# Patient Record
Sex: Male | Born: 1940
Health system: Southern US, Community
[De-identification: ages and names within clinical notes are randomized; demographics above are authoritative.]

## PROBLEM LIST (undated history)

## (undated) DIAGNOSIS — C61 Malignant neoplasm of prostate: Secondary | ICD-10-CM

## (undated) DIAGNOSIS — E785 Hyperlipidemia, unspecified: Secondary | ICD-10-CM

## (undated) DIAGNOSIS — I10 Essential (primary) hypertension: Secondary | ICD-10-CM

## (undated) DIAGNOSIS — N529 Male erectile dysfunction, unspecified: Secondary | ICD-10-CM

## (undated) DIAGNOSIS — I724 Aneurysm of artery of lower extremity: Secondary | ICD-10-CM

## (undated) DIAGNOSIS — K219 Gastro-esophageal reflux disease without esophagitis: Secondary | ICD-10-CM

## (undated) DIAGNOSIS — M1990 Unspecified osteoarthritis, unspecified site: Secondary | ICD-10-CM

## (undated) DIAGNOSIS — I739 Peripheral vascular disease, unspecified: Secondary | ICD-10-CM

## (undated) HISTORY — DX: Morbid (severe) obesity due to excess calories: E66.01

## (undated) HISTORY — PX: COLONOSCOPY: SHX174

---
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
## 2011-09-05 ENCOUNTER — Ambulatory Visit (INDEPENDENT_AMBULATORY_CARE_PROVIDER_SITE_OTHER): Payer: Medicare Other | Admitting: Internal Medicine

## 2011-09-05 ENCOUNTER — Telehealth: Payer: Self-pay | Admitting: Internal Medicine

## 2011-09-05 ENCOUNTER — Encounter: Payer: Self-pay | Admitting: Internal Medicine

## 2011-09-05 DIAGNOSIS — R972 Elevated prostate specific antigen [PSA]: Secondary | ICD-10-CM

## 2011-09-05 DIAGNOSIS — C61 Malignant neoplasm of prostate: Secondary | ICD-10-CM | POA: Insufficient documentation

## 2011-09-05 DIAGNOSIS — E785 Hyperlipidemia, unspecified: Secondary | ICD-10-CM

## 2011-09-05 DIAGNOSIS — Z79899 Other long term (current) drug therapy: Secondary | ICD-10-CM

## 2011-09-05 DIAGNOSIS — I10 Essential (primary) hypertension: Secondary | ICD-10-CM

## 2011-09-05 MED ORDER — SIMVASTATIN 40 MG PO TABS: 40.0000 mg | ORAL_TABLET | Freq: Every day | ORAL | Status: DC

## 2011-09-05 MED ORDER — LOSARTAN POTASSIUM-HCTZ 100-25 MG PO TABS: 1.0000 | ORAL_TABLET | Freq: Every day | ORAL | Status: DC

## 2011-09-05 NOTE — Progress Notes (Signed)
  Subjective:    Patient ID: Bryan Rivers, male    DOB: September 25, 1941, 70 y.o.   MRN: [id number]  HPI Pt presents to clinic to establish care and for follow up of hyperlipidemia and hypertension. Previous PMD recently retired. BP has been under good control and he exercises daily with cardio. Tolerates medication without adverse effect. Hopes with exercise and wt loss may ultimately lower amount of medication taken. Tolerates statin tx and reports previous good control of cholesterol with medication. Recalls possible past elevated psa though number may have actually been in the 3 range. Denies bph sx's. Feels well without complaint. Declines influenza, pneumovax or zostavax vaccines. Recalls colonoscopy last performed 2009 and nl without family h/o colon cancer.   Reviewed pmh, psh, medications, allergies, soc hx and fam hx.    Review of Systems  Respiratory: Negative for shortness of breath.   Cardiovascular: Negative for chest pain.  Gastrointestinal: Negative for abdominal pain and blood in stool.  Genitourinary: Negative for difficulty urinating.  All other systems reviewed and are negative.       Objective:   Physical Exam  Nursing note and vitals reviewed. Constitutional: He appears well-developed and well-nourished. No distress.  HENT:  Head: Normocephalic and atraumatic.  Right Ear: Tympanic membrane, external ear and ear canal normal.  Left Ear: Tympanic membrane, external ear and ear canal normal.  Nose: Nose normal.  Mouth/Throat: Oropharynx is clear and moist. No oropharyngeal exudate.  Eyes: Conjunctivae are normal. No scleral icterus.  Neck: Neck supple. Carotid bruit is not present.  Cardiovascular: Normal rate, regular rhythm and normal heart sounds.  Exam reveals no gallop and no friction rub.   No murmur heard. Pulmonary/Chest: Effort normal and breath sounds normal. No respiratory distress. He has no wheezes. He has no rales.  Neurological: He is alert.  Skin: Skin  is warm and dry. He is not diaphoretic.  Psychiatric: He has a normal mood and affect.          Assessment & Plan:

## 2011-09-05 NOTE — Assessment & Plan Note (Signed)
Records unavailable. Obtain psa

## 2011-09-05 NOTE — Assessment & Plan Note (Signed)
RF statin. Obtain lipid/lft.

## 2011-09-05 NOTE — Patient Instructions (Signed)
Please schedule cbc (401.9), chem7 (v58.69), lipid/lft (272.4) and psa (elevated psa) for Thursday morning 09/07/11. Also please schedule chem7 (v58.69) and lipid/lft (272.4) prior to next visit

## 2011-09-05 NOTE — Telephone Encounter (Signed)
Please order labs for Thursday 09/07/11. Patient will be going downstairs.   Also, please order labs for one week prior to 03/05/12 visit.

## 2011-09-07 ENCOUNTER — Telehealth: Payer: Self-pay | Admitting: *Deleted

## 2011-09-07 LAB — CBC
HCT: 46.1 % (ref 39.0–52.0)
Hemoglobin: 16 g/dL (ref 13.0–17.0)
RBC: 5.33 MIL/uL (ref 4.22–5.81)
RDW: 13.3 % (ref 11.5–15.5)
WBC: 5.6 10*3/uL (ref 4.0–10.5)

## 2011-09-07 LAB — BASIC METABOLIC PANEL
BUN: 25 mg/dL — ABNORMAL HIGH (ref 6–23)
Chloride: 101 mEq/L (ref 96–112)
Glucose, Bld: 103 mg/dL — ABNORMAL HIGH (ref 70–99)
Potassium: 4.8 mEq/L (ref 3.5–5.3)

## 2011-09-07 LAB — LIPID PANEL
LDL Cholesterol: 118 mg/dL — ABNORMAL HIGH (ref 0–99)
Triglycerides: 83 mg/dL (ref <150)
VLDL: 17 mg/dL (ref 0–40)

## 2011-09-07 LAB — HEPATIC FUNCTION PANEL
ALT: 42 U/L (ref 0–53)
Bilirubin, Direct: 0.2 mg/dL (ref 0.0–0.3)

## 2011-09-07 NOTE — Telephone Encounter (Signed)
Call was returned to patient at [phone], no answer. A voice message was left informing patient call was returned.

## 2011-09-07 NOTE — Telephone Encounter (Signed)
Patients wife called and left voice message stating patient picked up Rx from pharmacy and they needed clarification on the medication.

## 2011-09-08 NOTE — Telephone Encounter (Signed)
Call placed to patient at [phone], patient wife explained that patient was not familiar with the generic of the Mobic, and thought that he was given something else. She stated they contacted the pharmacy and clarification was provided to patient. They have no concerns at this time. Medication concerns was addressed by the pharmacist.

## 2011-09-13 ENCOUNTER — Other Ambulatory Visit: Payer: Self-pay | Admitting: Internal Medicine

## 2011-09-13 DIAGNOSIS — R972 Elevated prostate specific antigen [PSA]: Secondary | ICD-10-CM

## 2011-09-25 ENCOUNTER — Telehealth: Payer: Self-pay | Admitting: Internal Medicine

## 2011-09-25 NOTE — Telephone Encounter (Signed)
She is upset with the wrong people. We did not charge her. It apparently is the policy of [facility] or their record company. Any complaints should be directed towards [facility].

## 2011-09-25 NOTE — Telephone Encounter (Signed)
Call placed to patient at [phone], he stated his wife placed the call, and she stated that she was not requested a return phone call. Nothing further needed at this time.

## 2011-09-25 NOTE — Telephone Encounter (Signed)
Patient's wife called upset.  We requested records for Bryan Rivers from [employer].  [facility] uses [employer] and they sent her a bill for $28 to send the records to [place].  She contends we should have told her [facility] was going to charge for the records.  I advised her to call [facility] and maybe they will waive the charge

## 2011-12-21 ENCOUNTER — Other Ambulatory Visit: Payer: Self-pay | Admitting: *Deleted

## 2011-12-21 DIAGNOSIS — R972 Elevated prostate specific antigen [PSA]: Secondary | ICD-10-CM

## 2011-12-22 LAB — PSA: PSA: 6.74 ng/mL — ABNORMAL HIGH (ref <=4.00)

## 2012-02-27 ENCOUNTER — Other Ambulatory Visit: Payer: Self-pay | Admitting: Internal Medicine

## 2012-02-27 LAB — LIPID PANEL
Cholesterol: 175 mg/dL (ref 0–200)
LDL Cholesterol: 107 mg/dL — ABNORMAL HIGH (ref 0–99)
VLDL: 24 mg/dL (ref 0–40)

## 2012-02-27 NOTE — Telephone Encounter (Signed)
Pt presented to the lab, future orders released. 

## 2012-02-27 NOTE — Telephone Encounter (Signed)
Addended by: Mervin Kung A on: 02/27/2012 09:00 AM   Modules accepted: Orders

## 2012-02-28 LAB — HEPATIC FUNCTION PANEL
ALT: 36 U/L (ref 0–53)
Bilirubin, Direct: 0.1 mg/dL (ref 0.0–0.3)
Indirect Bilirubin: 0.6 mg/dL (ref 0.0–0.9)

## 2012-02-28 LAB — BASIC METABOLIC PANEL
CO2: 27 mEq/L (ref 19–32)
Calcium: 9.9 mg/dL (ref 8.4–10.5)
Sodium: 140 mEq/L (ref 135–145)

## 2012-03-05 ENCOUNTER — Encounter: Payer: Self-pay | Admitting: Internal Medicine

## 2012-03-05 ENCOUNTER — Ambulatory Visit (INDEPENDENT_AMBULATORY_CARE_PROVIDER_SITE_OTHER): Payer: Medicare Other | Admitting: Internal Medicine

## 2012-03-05 VITALS — BP 118/80 | HR 64 | Temp 98.4°F | Resp 18 | Ht 69.0 in | Wt 223.0 lb

## 2012-03-05 DIAGNOSIS — E785 Hyperlipidemia, unspecified: Secondary | ICD-10-CM

## 2012-03-05 DIAGNOSIS — I10 Essential (primary) hypertension: Secondary | ICD-10-CM

## 2012-03-05 DIAGNOSIS — R972 Elevated prostate specific antigen [PSA]: Secondary | ICD-10-CM

## 2012-03-05 NOTE — Assessment & Plan Note (Signed)
Reviewed mildly elevated psa that is slowly rising from baseline. Asx without evidence of urinary obstruction. Discussion held regarding PSA test including chance of representing prostate cancer versus false positive. Pt not interested currently in urology evaluation or biopsy. Agrees to follow up PSA's to determine kinetics. Understands this could potentially represent malignancy.

## 2012-03-05 NOTE — Assessment & Plan Note (Signed)
Normotensive and stable. Continue current regimen. Monitor bp as outpt and followup in clinic as scheduled.  

## 2012-03-05 NOTE — Patient Instructions (Signed)
Please schedule fasting labs prior to next visit Lipid- 272.4 and PSA-elevated psa

## 2012-03-05 NOTE — Progress Notes (Signed)
  Subjective:    Patient ID: Bryan Rivers, male    DOB: 1941-06-07, 71 y.o.   MRN: [id number]  HPI Pt presents to clinic for followup of multiple medical problems. H/o mildly elevated psa-reviewed values from 2010 and 2011 in the 5 range. More recent PSA's reviewed 5-6 range representing mild elevation. Denies any urinary complaint. Tolerating statin tx without adverse effect. LDL improved. BP reviewed normotensive.   No past medical history on file. Past Surgical History  Procedure Date  . Appendectomy 1950    age 9    reports that he has never smoked. He has never used smokeless tobacco. He reports that he does not drink alcohol or use illicit drugs. family history includes Diabetes in his mother and Heart disease in his mother. Not on File    Review of Systems see hpi     Objective:   Physical Exam  Physical Exam  Nursing note and vitals reviewed. Constitutional: Appears well-developed and well-nourished. No distress.  HENT:  Head: Normocephalic and atraumatic.  Right Ear: External ear normal.  Left Ear: External ear normal.  Eyes: Conjunctivae are normal. No scleral icterus.  Neck: Neck supple. Carotid bruit is not present.  Cardiovascular: Normal rate, regular rhythm and normal heart sounds.  Exam reveals no gallop and no friction rub.   No murmur heard. Pulmonary/Chest: Effort normal and breath sounds normal. No respiratory distress. He has no wheezes. no rales.  Lymphadenopathy:    He has no cervical adenopathy.  Neurological:Alert.  Skin: Skin is warm and dry. Not diaphoretic.  Psychiatric: Has a normal mood and affect.        Assessment & Plan:

## 2012-03-05 NOTE — Assessment & Plan Note (Signed)
Improved control. Pt wishes trial of lower dose statin. Decrease zocor 20mg  qd. Recheck lipid prior to next visit

## 2012-03-28 ENCOUNTER — Telehealth: Payer: Self-pay | Admitting: *Deleted

## 2012-03-28 MED ORDER — ZOSTER VACCINE LIVE 19400 UNT/0.65ML ~~LOC~~ SOLR: 0.6500 mL | Freq: Once | SUBCUTANEOUS | Status: AC

## 2012-03-28 NOTE — Telephone Encounter (Signed)
Call returned to patient at [phone], he was informed of Rx to pharmacy.

## 2012-03-28 NOTE — Telephone Encounter (Signed)
Patients wife called and left voice message stating her husband insurance will cover the Zostavax if the vaccine is given at the pharmacy. She is requesting a Rx to [facility] on [address] if approved.

## 2012-03-28 NOTE — Telephone Encounter (Signed)
Ok for zostavax and pharmacy to [facility]

## 2012-07-02 ENCOUNTER — Telehealth: Payer: Self-pay | Admitting: *Deleted

## 2012-07-02 ENCOUNTER — Ambulatory Visit: Payer: Medicare Other | Admitting: Internal Medicine

## 2012-07-02 DIAGNOSIS — R972 Elevated prostate specific antigen [PSA]: Secondary | ICD-10-CM

## 2012-07-02 DIAGNOSIS — E785 Hyperlipidemia, unspecified: Secondary | ICD-10-CM

## 2012-07-02 LAB — LIPID PANEL
Cholesterol: 232 mg/dL — ABNORMAL HIGH (ref 0–200)
Total CHOL/HDL Ratio: 4.4 Ratio
Triglycerides: 120 mg/dL (ref <150)
VLDL: 24 mg/dL (ref 0–40)

## 2012-07-02 NOTE — Telephone Encounter (Signed)
Pt presented to the lab, order entered per 03/05/12 office note:  Please schedule fasting labs prior to next visit  Lipid- 272.4 and PSA-elevated psa

## 2012-07-09 ENCOUNTER — Encounter: Payer: Self-pay | Admitting: Internal Medicine

## 2012-07-09 ENCOUNTER — Ambulatory Visit (INDEPENDENT_AMBULATORY_CARE_PROVIDER_SITE_OTHER): Payer: Medicare Other | Admitting: Internal Medicine

## 2012-07-09 ENCOUNTER — Telehealth: Payer: Self-pay | Admitting: Internal Medicine

## 2012-07-09 VITALS — BP 122/68 | HR 81 | Temp 98.4°F | Resp 16 | Wt 230.0 lb

## 2012-07-09 DIAGNOSIS — Z79899 Other long term (current) drug therapy: Secondary | ICD-10-CM

## 2012-07-09 DIAGNOSIS — R972 Elevated prostate specific antigen [PSA]: Secondary | ICD-10-CM

## 2012-07-09 DIAGNOSIS — I10 Essential (primary) hypertension: Secondary | ICD-10-CM

## 2012-07-09 DIAGNOSIS — E785 Hyperlipidemia, unspecified: Secondary | ICD-10-CM

## 2012-07-09 NOTE — Patient Instructions (Signed)
Please schedule fasting labs prior to next visit Cbc-401.9, chem7-v58.69, lipid/lft-272.4 

## 2012-07-09 NOTE — Assessment & Plan Note (Signed)
Stable and asx. Continue to monitor.

## 2012-07-09 NOTE — Assessment & Plan Note (Signed)
suboptimal control. Resume zocor 40mg  po qd. Obtain lipid/lft prior to next visit

## 2012-07-09 NOTE — Assessment & Plan Note (Signed)
Normotensive and stable. Continue current regimen. Monitor bp as outpt and followup in clinic as scheduled. Obtain cbc and chem7 prior to next visit 

## 2012-07-09 NOTE — Progress Notes (Signed)
  Subjective:    Patient ID: Bryan Rivers, male    DOB: [birth year], 71 y.o.   MRN: [id number]  HPI Pt presents to clinic for followup of multiple medical problems. H/o mildly elevated psa with typical range 5-6's. Not interested in urology evaluation. Reviewed psa stable. Denies urinary sx's such as hesitency, nocturia or dribbling. Reviewed lipid profile now suboptimal after dose reduction of statin. Feels well overall without complaint. Exercises regularly.   No past medical history on file. Past Surgical History  Procedure Date  . Appendectomy 1950    age 71    reports that he has never smoked. He has never used smokeless tobacco. He reports that he does not drink alcohol or use illicit drugs. family history includes Diabetes in his mother and Heart disease in his mother. No Known Allergies    Review of Systems see hpi     Objective:   Physical Exam  Physical Exam  Nursing note and vitals reviewed. Constitutional: Appears well-developed and well-nourished. No distress.  HENT:  Head: Normocephalic and atraumatic.  Right Ear: External ear normal.  Left Ear: External ear normal.  Eyes: Conjunctivae are normal. No scleral icterus.  Neck: Neck supple. Carotid bruit is not present.  Cardiovascular: Normal rate, regular rhythm and normal heart sounds.  Exam reveals no gallop and no friction rub.   No murmur heard. Pulmonary/Chest: Effort normal and breath sounds normal. No respiratory distress. He has no wheezes. no rales.  Lymphadenopathy:    He has no cervical adenopathy.  Neurological:Alert.  Skin: Skin is warm and dry. Not diaphoretic.  Psychiatric: Has a normal mood and affect.        Assessment & Plan:

## 2012-08-01 NOTE — Telephone Encounter (Signed)
Future orders entered and copy given to the lab. 

## 2012-09-02 ENCOUNTER — Telehealth: Payer: Self-pay | Admitting: Internal Medicine

## 2012-09-02 DIAGNOSIS — I10 Essential (primary) hypertension: Secondary | ICD-10-CM

## 2012-09-02 DIAGNOSIS — E785 Hyperlipidemia, unspecified: Secondary | ICD-10-CM

## 2012-09-02 DIAGNOSIS — Z79899 Other long term (current) drug therapy: Secondary | ICD-10-CM

## 2012-09-02 MED ORDER — SIMVASTATIN 40 MG PO TABS: 40.0000 mg | ORAL_TABLET | Freq: Every day | ORAL | Status: DC

## 2012-09-02 NOTE — Telephone Encounter (Signed)
Refill- simvastatin 40mg  tablets. Take one tablet by mouth every night at bedtime. Qty 30 last fill 7.8.13

## 2012-09-16 ENCOUNTER — Telehealth: Payer: Self-pay | Admitting: Internal Medicine

## 2012-09-16 MED ORDER — LOSARTAN POTASSIUM-HCTZ 100-25 MG PO TABS: 1.0000 | ORAL_TABLET | Freq: Every day | ORAL | Status: DC

## 2012-09-16 NOTE — Telephone Encounter (Signed)
Rx to pharmacy/SLS 

## 2012-09-16 NOTE — Telephone Encounter (Signed)
Refill-losartan/hctz 100/25mg  tablets. Take one tablet by mouth daily. Qty 30 last fill 7.21.13

## 2012-10-31 LAB — HEPATIC FUNCTION PANEL
AST: 17 U/L (ref 0–37)
Albumin: 4.5 g/dL (ref 3.5–5.2)
Alkaline Phosphatase: 56 U/L (ref 39–117)
Indirect Bilirubin: 0.7 mg/dL (ref 0.0–0.9)
Total Bilirubin: 0.8 mg/dL (ref 0.3–1.2)
Total Protein: 6.7 g/dL (ref 6.0–8.3)

## 2012-10-31 LAB — CBC WITH DIFFERENTIAL/PLATELET
Eosinophils Absolute: 0.1 10*3/uL (ref 0.0–0.7)
Eosinophils Relative: 2 % (ref 0–5)
HCT: 45.6 % (ref 39.0–52.0)
Hemoglobin: 15.9 g/dL (ref 13.0–17.0)
Lymphocytes Relative: 36 % (ref 12–46)
Lymphs Abs: 1.8 10*3/uL (ref 0.7–4.0)
MCH: 29.3 pg (ref 26.0–34.0)
MCV: 84 fL (ref 78.0–100.0)
Monocytes Relative: 11 % (ref 3–12)
RBC: 5.43 MIL/uL (ref 4.22–5.81)

## 2012-10-31 LAB — BASIC METABOLIC PANEL
Chloride: 103 mEq/L (ref 96–112)
Potassium: 4.7 mEq/L (ref 3.5–5.3)
Sodium: 140 mEq/L (ref 135–145)

## 2012-10-31 LAB — LIPID PANEL
LDL Cholesterol: 110 mg/dL — ABNORMAL HIGH (ref 0–99)
Total CHOL/HDL Ratio: 3.6 Ratio

## 2012-10-31 NOTE — Addendum Note (Signed)
Addended by: Regis Bill on: 10/31/2012 09:06 AM   Modules accepted: Orders

## 2012-11-04 ENCOUNTER — Ambulatory Visit (INDEPENDENT_AMBULATORY_CARE_PROVIDER_SITE_OTHER): Payer: Medicare Other | Admitting: Internal Medicine

## 2012-11-04 ENCOUNTER — Telehealth: Payer: Self-pay | Admitting: Internal Medicine

## 2012-11-04 ENCOUNTER — Encounter: Payer: Self-pay | Admitting: Internal Medicine

## 2012-11-04 VITALS — BP 148/78 | HR 64 | Temp 98.2°F | Resp 12 | Wt 227.1 lb

## 2012-11-04 DIAGNOSIS — I10 Essential (primary) hypertension: Secondary | ICD-10-CM

## 2012-11-04 DIAGNOSIS — E785 Hyperlipidemia, unspecified: Secondary | ICD-10-CM

## 2012-11-04 DIAGNOSIS — R7309 Other abnormal glucose: Secondary | ICD-10-CM

## 2012-11-04 DIAGNOSIS — R739 Hyperglycemia, unspecified: Secondary | ICD-10-CM

## 2012-11-04 NOTE — Assessment & Plan Note (Signed)
Good control. Continue statin tx 

## 2012-11-04 NOTE — Patient Instructions (Addendum)
Please schedule fasting labs prior to next visit Chem7, a1c-hyperlgycemia and psa- elevated psa

## 2012-11-04 NOTE — Progress Notes (Signed)
  Subjective:    Patient ID: Bryan Rivers, male    DOB: 1941-02-12, 71 y.o.   MRN: [id number]  HPI Pt presents to clinic for followup of multiple medical problems. Chol reviewed improved. Exercises regularly. BP mildly high but average control on home monitoring. Reviewed fasting glucose of 113 with no h/o DM. States does eat bread on a regular basis.   No past medical history on file. Past Surgical History  Procedure Date  . Appendectomy 1950    age 41    reports that he has never smoked. He has never used smokeless tobacco. He reports that he does not drink alcohol or use illicit drugs. family history includes Diabetes in his mother and Heart disease in his mother. No Known Allergies    Review of Systems see hpi     Objective:   Physical Exam  Physical Exam  Nursing note and vitals reviewed. Constitutional: Appears well-developed and well-nourished. No distress.  HENT:  Head: Normocephalic and atraumatic.  Right Ear: External ear normal.  Left Ear: External ear normal.  Eyes: Conjunctivae are normal. No scleral icterus.  Neck: Neck supple. Carotid bruit is not present.  Cardiovascular: Normal rate, regular rhythm and normal heart sounds.  Exam reveals no gallop and no friction rub.   No murmur heard. Pulmonary/Chest: Effort normal and breath sounds normal. No respiratory distress. He has no wheezes. no rales.  Lymphadenopathy:    He has no cervical adenopathy.  Neurological:Alert.  Skin: Skin is warm and dry. Not diaphoretic.  Psychiatric: Has a normal mood and affect.        Assessment & Plan:

## 2012-11-04 NOTE — Assessment & Plan Note (Signed)
Not diagnostic for dm. Continue exercise. Begin low sugar/carb diet. Obtain chem7 and a1c prior to next visit

## 2012-11-04 NOTE — Assessment & Plan Note (Signed)
Isolated elevation. Continue exercise and hyzaar. Monitor bp as outpt. If elevations persist add second agent.

## 2012-11-04 NOTE — Telephone Encounter (Signed)
Lab order week of 02-04-2013 Chem7, a1c-hyperlgycemia and psa- elevated psa

## 2012-11-05 ENCOUNTER — Telehealth: Payer: Self-pay | Admitting: *Deleted

## 2012-11-05 NOTE — Telephone Encounter (Signed)
Received call from pt's wife wanting to know if we ordered a PSA level on pt's recent lab results. Reports previous PSA level was elevated and they want [place] to follow up on this.  Please advise if you would like me to have the lab add the test to 10/31/12 labs?

## 2012-11-05 NOTE — Telephone Encounter (Signed)
Was not checked this time. Was just checked a few months ago and was mildly high but stable. Should be ordered for next visit (pls confirm). He has said he doesn't want to see urology but if it is going to worry them so much as to want it checked every 3 months then really should see urology

## 2012-11-06 NOTE — Telephone Encounter (Signed)
Left message on home # to return my call. 

## 2012-11-07 NOTE — Telephone Encounter (Signed)
Attempted to reach pt and left message to return my call. 

## 2012-12-17 ENCOUNTER — Telehealth: Payer: Self-pay | Admitting: Internal Medicine

## 2012-12-17 NOTE — Telephone Encounter (Signed)
Elevated PSA - Estill Cotta, MD 07/09/2012 1:01 PM Signed  Stable and asx. Continue to monitor. Please schedule fasting labs prior to next visit  Cbc-401.9, chem7-v58.69, lipid/lft-272.4  Letitia Libra, Ala Dach, MD 11/04/2012 12:09 PM Signed Return in about 3 months (around 02/02/2013). Please schedule fasting labs prior to next visit  Chem7, a1c-hyperlgycemia and psa- elevated psa   Information from past OV notes relayed to pt's wife [caller], understood & will be in for fasting labs prior to March OV/SLS

## 2012-12-17 NOTE — Telephone Encounter (Signed)
Patients wife called in wanting to know why patients PSA level was not checked at last [facility] visit? Please call after 12:00pm.

## 2013-01-11 ENCOUNTER — Other Ambulatory Visit: Payer: Self-pay

## 2013-01-21 ENCOUNTER — Telehealth: Payer: Self-pay | Admitting: Internal Medicine

## 2013-01-21 ENCOUNTER — Telehealth: Payer: Self-pay

## 2013-01-21 MED ORDER — LOSARTAN POTASSIUM-HCTZ 100-25 MG PO TABS: 1.0000 | ORAL_TABLET | Freq: Every day | ORAL | Status: DC

## 2013-01-21 NOTE — Telephone Encounter (Signed)
losartin hctz 100/25mg  take 1 by mouth daily  [facility] [address] in [place]  Patient only has 2 more pills

## 2013-01-21 NOTE — Telephone Encounter (Signed)
RX sent to pharmacy  

## 2013-02-05 ENCOUNTER — Telehealth: Payer: Self-pay | Admitting: *Deleted

## 2013-02-05 DIAGNOSIS — E785 Hyperlipidemia, unspecified: Secondary | ICD-10-CM

## 2013-02-05 DIAGNOSIS — R739 Hyperglycemia, unspecified: Secondary | ICD-10-CM

## 2013-02-05 DIAGNOSIS — R972 Elevated prostate specific antigen [PSA]: Secondary | ICD-10-CM

## 2013-02-05 LAB — BASIC METABOLIC PANEL
BUN: 20 mg/dL (ref 6–23)
CO2: 29 mEq/L (ref 19–32)
Chloride: 104 mEq/L (ref 96–112)
Glucose, Bld: 109 mg/dL — ABNORMAL HIGH (ref 70–99)
Potassium: 4.6 mEq/L (ref 3.5–5.3)
Sodium: 140 mEq/L (ref 135–145)

## 2013-02-05 LAB — LIPID PANEL
Cholesterol: 171 mg/dL (ref 0–200)
HDL: 52 mg/dL (ref >39)
LDL Cholesterol: 103 mg/dL — ABNORMAL HIGH (ref 0–99)
Triglycerides: 81 mg/dL (ref <150)
VLDL: 16 mg/dL (ref 0–40)

## 2013-02-05 NOTE — Telephone Encounter (Signed)
Pt presented to the lab today. Orders were entered per 11/04/12 office note. Pt wants to know if we will check his cholesterol? Lipids last checked on 11/04/12.  Please advise.

## 2013-02-05 NOTE — Telephone Encounter (Signed)
Sure he can have a lipid panel added for 272.4. Please enter the order if possible THX

## 2013-02-05 NOTE — Telephone Encounter (Signed)
Pt presented to the lab. Orders entered per 11/04/12 office note as below:  Please schedule fasting labs prior to next visit  Chem7, a1c-hyperlgycemia and psa- elevated psa

## 2013-02-05 NOTE — Telephone Encounter (Signed)
Orders entered and given to the lab. 

## 2013-02-06 LAB — HEMOGLOBIN A1C: Hgb A1c MFr Bld: 5.8 % — ABNORMAL HIGH (ref <5.7)

## 2013-02-11 ENCOUNTER — Telehealth: Payer: Self-pay | Admitting: Internal Medicine

## 2013-02-11 ENCOUNTER — Other Ambulatory Visit: Payer: Self-pay | Admitting: Family Medicine

## 2013-02-11 ENCOUNTER — Encounter: Payer: Self-pay | Admitting: Internal Medicine

## 2013-02-11 ENCOUNTER — Encounter: Payer: Self-pay | Admitting: Family Medicine

## 2013-02-11 ENCOUNTER — Ambulatory Visit (INDEPENDENT_AMBULATORY_CARE_PROVIDER_SITE_OTHER): Payer: Medicare Other | Admitting: Family Medicine

## 2013-02-11 DIAGNOSIS — R7309 Other abnormal glucose: Secondary | ICD-10-CM

## 2013-02-11 DIAGNOSIS — R739 Hyperglycemia, unspecified: Secondary | ICD-10-CM

## 2013-02-11 DIAGNOSIS — E785 Hyperlipidemia, unspecified: Secondary | ICD-10-CM

## 2013-02-11 DIAGNOSIS — R972 Elevated prostate specific antigen [PSA]: Secondary | ICD-10-CM

## 2013-02-11 DIAGNOSIS — I10 Essential (primary) hypertension: Secondary | ICD-10-CM

## 2013-02-11 HISTORY — DX: Morbid (severe) obesity due to excess calories: E66.01

## 2013-02-11 MED ORDER — LOSARTAN POTASSIUM-HCTZ 100-25 MG PO TABS: 1.0000 | ORAL_TABLET | Freq: Every day | ORAL | Status: DC

## 2013-02-11 NOTE — Patient Instructions (Addendum)
Zostavax is shingles shot, if you want it call and we will print a copy you can take it to the pharmacy and they will give it to you   DASH Diet The DASH diet stands for "Dietary Approaches to Stop Hypertension." It is a healthy eating plan that has been shown to reduce high blood pressure (hypertension) in as little as 14 days, while also possibly providing other significant health benefits. These other health benefits include reducing the risk of breast cancer after menopause and reducing the risk of type 2 diabetes, heart disease, colon cancer, and stroke. Health benefits also include weight loss and slowing kidney failure in patients with chronic kidney disease.  DIET GUIDELINES  Limit salt (sodium). Your diet should contain less than 1500 mg of sodium daily.  Limit refined or processed carbohydrates. Your diet should include mostly whole grains. Desserts and added sugars should be used sparingly.  Include small amounts of heart-healthy fats. These types of fats include nuts, oils, and tub margarine. Limit saturated and trans fats. These fats have been shown to be harmful in the body. CHOOSING FOODS  The following food groups are based on a 2000 calorie diet. See your Registered Dietitian for individual calorie needs. Grains and Grain Products (6 to 8 servings daily)  Eat More Often: Whole-wheat bread, brown rice, whole-grain or wheat pasta, quinoa, popcorn without added fat or salt (air popped).  Eat Less Often: White bread, white pasta, white rice, cornbread. Vegetables (4 to 5 servings daily)  Eat More Often: Fresh, frozen, and canned vegetables. Vegetables may be raw, steamed, roasted, or grilled with a minimal amount of fat.  Eat Less Often/Avoid: Creamed or fried vegetables. Vegetables in a cheese sauce. Fruit (4 to 5 servings daily)  Eat More Often: All fresh, canned (in natural juice), or frozen fruits. Dried fruits without added sugar. One hundred percent fruit juice ( cup  [237 mL] daily).  Eat Less Often: Dried fruits with added sugar. Canned fruit in light or heavy syrup. [employer], Fish, and Poultry (2 servings or less daily. One serving is 3 to 4 oz [85-114 g]).  Eat More Often: Ninety percent or leaner ground beef, tenderloin, sirloin. Round cuts of beef, chicken breast, [place] breast. All fish. Grill, bake, or broil your meat. Nothing should be fried.  Eat Less Often/Avoid: Fatty cuts of meat, [place], or chicken leg, thigh, or wing. Fried cuts of meat or fish. Dairy (2 to 3 servings)  Eat More Often: Low-fat or fat-free milk, low-fat plain or light yogurt, reduced-fat or part-skim cheese.  Eat Less Often/Avoid: Milk (whole, 2%).Whole milk yogurt. Full-fat cheeses. Nuts, Seeds, and Legumes (4 to 5 servings per week)  Eat More Often: All without added salt.  Eat Less Often/Avoid: Salted nuts and seeds, canned beans with added salt. Fats and Sweets (limited)  Eat More Often: Vegetable oils, tub margarines without trans fats, sugar-free gelatin. Mayonnaise and salad dressings.  Eat Less Often/Avoid: Coconut oils, palm oils, butter, stick margarine, cream, half and half, cookies, candy, pie. FOR MORE INFORMATION The Dash Diet Eating Plan: www.dashdiet.org Document Released: 11/02/2011 Document Revised: 02/05/2012 Document Reviewed: 11/02/2011 [facility] Patient Information 2013 [place], [place].

## 2013-02-11 NOTE — Assessment & Plan Note (Signed)
Well controlled, minimize simple carbs 

## 2013-02-11 NOTE — Assessment & Plan Note (Signed)
Well controlled, no changes to meds 

## 2013-02-11 NOTE — Progress Notes (Signed)
Patient ID: Bryan Rivers, male   DOB: 15-Mar-1941, 72 y.o.   MRN: [id number] Bryan Rivers [id number] 1941-04-07 02/11/2013      Progress Note-Follow Up  Subjective  Chief Complaint  Chief Complaint  Patient presents with  . Follow-up    3 month    HPI  Patient is a 72 year old [place] American in today for follow up. Doing very well. Exercises daily, eats well, home made foods mostly. No recent illness, fevers, HA, CP, palp, SOB,GI or GU c/o. Denies any recent illness or concerns. Denies any urinary urgency, frequency, hesitancy.  Past Medical History  Diagnosis Date  . Morbid obesity 02/11/2013    Past Surgical History  Procedure Laterality Date  . Appendectomy  1950    age 17    Family History  Problem Relation Age of Onset  . Heart disease Mother   . Diabetes Mother     History   Social History  . Marital Status: Married    Spouse Name: N/A    Number of Children: N/A  . Years of Education: N/A   Occupational History  . Not on file.   Social History Main Topics  . Smoking status: Never Smoker   . Smokeless tobacco: Never Used  . Alcohol Use: No  . Drug Use: No  . Sexually Active: Not on file   Other Topics Concern  . Not on file   Social History Narrative   Married 47 years   1 daughter age 3   Retired    Current Outpatient Prescriptions on File Prior to Visit  Medication Sig Dispense Refill  . aspirin 81 MG tablet Take 81 mg by mouth daily.      Marland Kitchen losartan-hydrochlorothiazide (HYZAAR) 100-25 MG per tablet Take 1 tablet by mouth daily.  30 tablet  3  . simvastatin (ZOCOR) 40 MG tablet Take 1 tablet (40 mg total) by mouth at bedtime.  30 tablet  11   No current facility-administered medications on file prior to visit.    No Known Allergies  Review of Systems  Review of Systems  Constitutional: Negative for fever and malaise/fatigue.  HENT: Negative for congestion.   Eyes: Negative for discharge.  Respiratory: Negative for shortness of breath.    Cardiovascular: Negative for chest pain, palpitations and leg swelling.  Gastrointestinal: Negative for nausea, abdominal pain and diarrhea.  Genitourinary: Negative for dysuria.  Musculoskeletal: Negative for falls.  Skin: Negative for rash.  Neurological: Negative for loss of consciousness and headaches.  Endo/Heme/Allergies: Negative for polydipsia.  Psychiatric/Behavioral: Negative for depression and suicidal ideas. The patient is not nervous/anxious and does not have insomnia.     Objective  BP 124/84  Pulse 69  Temp(Src) 97.6 F (36.4 C) (Oral)  Ht 5\' 9"  (1.753 m)  Wt 231 lb (104.781 kg)  BMI 34.1 kg/m2  SpO2 95%  Physical Exam  Physical Exam  Constitutional: He is oriented to person, place, and time and well-developed, well-nourished, and in no distress. No distress.  HENT:  Head: Normocephalic and atraumatic.  Eyes: Conjunctivae are normal.  Neck: Neck supple. No thyromegaly present.  Cardiovascular: Normal rate, regular rhythm and normal heart sounds.   No murmur heard. Pulmonary/Chest: Effort normal and breath sounds normal. No respiratory distress.  Abdominal: He exhibits no distension and no mass. There is no tenderness.  Musculoskeletal: He exhibits no edema.  Neurological: He is alert and oriented to person, place, and time.  Skin: Skin is warm.  Psychiatric: Memory, affect and judgment normal.  No results found for this basename: TSH   Lab Results  Component Value Date   WBC 5.1 10/31/2012   HGB 15.9 10/31/2012   HCT 45.6 10/31/2012   MCV 84.0 10/31/2012   PLT 190 10/31/2012   Lab Results  Component Value Date   CREATININE 0.92 02/05/2013   BUN 20 02/05/2013   NA 140 02/05/2013   K 4.6 02/05/2013   CL 104 02/05/2013   CO2 29 02/05/2013   Lab Results  Component Value Date   ALT 30 10/31/2012   AST 17 10/31/2012   ALKPHOS 56 10/31/2012   BILITOT 0.8 10/31/2012   Lab Results  Component Value Date   CHOL 171 02/05/2013   Lab Results  Component  Value Date   HDL 52 02/05/2013   Lab Results  Component Value Date   LDLCALC 103* 02/05/2013   Lab Results  Component Value Date   TRIG 81 02/05/2013   Lab Results  Component Value Date   CHOLHDL 3.3 02/05/2013     Assessment & Plan  Elevated PSA Slight increase but patient asymptomatic, will continue to monitor  Unspecified essential hypertension Well controlled, no changes to meds  Hyperlipidemia Well controlled, no changes, avoid trans fats  Morbid obesity Exercises daily, consider DASH diet, no trans fats  Hyperglycemia Well controlled, minimize simple carbs.

## 2013-02-11 NOTE — Telephone Encounter (Signed)
Lab order week of 08-04-2013 renal liver lipid hbga`1c psa

## 2013-02-11 NOTE — Telephone Encounter (Signed)
Patients wife called in requesting a 3 month supply of losartan to be sent to [facility] at [address]

## 2013-02-11 NOTE — Assessment & Plan Note (Signed)
Slight increase but patient asymptomatic, will continue to monitor

## 2013-02-11 NOTE — Assessment & Plan Note (Signed)
Exercises daily, consider DASH diet, no trans fats

## 2013-02-11 NOTE — Assessment & Plan Note (Signed)
Well controlled, no changes, avoid trans fats

## 2013-06-22 ENCOUNTER — Other Ambulatory Visit: Payer: Self-pay | Admitting: Family Medicine

## 2013-07-02 ENCOUNTER — Other Ambulatory Visit: Payer: Self-pay

## 2013-08-12 ENCOUNTER — Ambulatory Visit: Payer: Medicare Other | Admitting: Family Medicine

## 2013-10-02 ENCOUNTER — Other Ambulatory Visit: Payer: Self-pay

## 2013-11-27 HISTORY — PX: WRIST SURGERY: SHX841

## 2015-11-08 ENCOUNTER — Ambulatory Visit (INDEPENDENT_AMBULATORY_CARE_PROVIDER_SITE_OTHER): Payer: Medicare Other | Admitting: Family Medicine

## 2015-11-08 ENCOUNTER — Encounter: Payer: Self-pay | Admitting: Family Medicine

## 2015-11-08 ENCOUNTER — Ambulatory Visit (HOSPITAL_BASED_OUTPATIENT_CLINIC_OR_DEPARTMENT_OTHER)
Admission: RE | Admit: 2015-11-08 | Discharge: 2015-11-08 | Disposition: A | Discharge status: Home / Self Care | Payer: Medicare Other | Source: Ambulatory Visit | Attending: Family Medicine | Admitting: Family Medicine

## 2015-11-08 VITALS — BP 180/91 | HR 60 | Ht 72.0 in | Wt 200.0 lb

## 2015-11-08 DIAGNOSIS — S6992XA Unspecified injury of left wrist, hand and finger(s), initial encounter: Secondary | ICD-10-CM

## 2015-11-08 DIAGNOSIS — S2232XA Fracture of one rib, left side, initial encounter for closed fracture: Secondary | ICD-10-CM | POA: Insufficient documentation

## 2015-11-08 DIAGNOSIS — M25532 Pain in left wrist: Secondary | ICD-10-CM | POA: Diagnosis present

## 2015-11-08 DIAGNOSIS — R0781 Pleurodynia: Secondary | ICD-10-CM

## 2015-11-08 DIAGNOSIS — S52502A Unspecified fracture of the lower end of left radius, initial encounter for closed fracture: Secondary | ICD-10-CM | POA: Diagnosis not present

## 2015-11-08 DIAGNOSIS — S52612A Displaced fracture of left ulna styloid process, initial encounter for closed fracture: Secondary | ICD-10-CM | POA: Insufficient documentation

## 2015-11-08 DIAGNOSIS — W11XXXA Fall on and from ladder, initial encounter: Secondary | ICD-10-CM | POA: Diagnosis not present

## 2015-11-08 DIAGNOSIS — R918 Other nonspecific abnormal finding of lung field: Secondary | ICD-10-CM | POA: Insufficient documentation

## 2015-11-08 MED ORDER — IBUPROFEN 800 MG PO TABS: 800.0000 mg | ORAL_TABLET | Freq: Three times a day (TID) | ORAL | Status: DC | PRN

## 2015-11-08 NOTE — Patient Instructions (Signed)
The rib fracture and ulnar styloid fractures would heal with conservative treatment. However, the distal radius fracture is displaced and needs to be evaluated by orthopedic surgery. Wear splint with sling at all times until you see the surgeon. Ibuprofen 800mg  three times a day with food for pain and inflammation. Let me know if you need something stronger.

## 2015-11-09 DIAGNOSIS — S6992XA Unspecified injury of left wrist, hand and finger(s), initial encounter: Secondary | ICD-10-CM | POA: Insufficient documentation

## 2015-11-09 DIAGNOSIS — S2232XA Fracture of one rib, left side, initial encounter for closed fracture: Secondary | ICD-10-CM | POA: Insufficient documentation

## 2015-11-09 NOTE — Progress Notes (Signed)
PCP: Beckie Salts, MD  Subjective:   HPI: Patient is a 74 y.o. male here for left wrist, chest injuries.  Patient reports on 12/9 he was up on a ladder about 4-6 feet when he fell off onto his left side. Immediate pain left wrist and left side of ribs. Pain level 8/10 both areas. No prior injuries to these areas. Is unsure how his wrist bent - thinks he fell too quickly to put hand out to brace himself. + swelling and bruising of rib area. + swelling of left wrist. No shortness of breath, cough, other complaints.  Past Medical History  Diagnosis Date  . Morbid obesity ([place]) 02/11/2013    Current Outpatient Prescriptions on File Prior to Visit  Medication Sig Dispense Refill  . aspirin 81 MG tablet Take 81 mg by mouth daily.    . fish oil-omega-3 fatty acids 1000 MG capsule Take 2 g by mouth daily.    Marland Kitchen losartan-hydrochlorothiazide (HYZAAR) 100-25 MG per tablet TAKE 1 TABLET BY MOUTH EVERY DAY 30 tablet 0  . simvastatin (ZOCOR) 40 MG tablet Take 1 tablet (40 mg total) by mouth at bedtime. 30 tablet 11   No current facility-administered medications on file prior to visit.    Past Surgical History  Procedure Laterality Date  . Appendectomy  1950    age 81    No Known Allergies  Social History   Social History  . Marital Status: Married    Spouse Name: N/A  . Number of Children: N/A  . Years of Education: N/A   Occupational History  . Not on file.   Social History Main Topics  . Smoking status: Never Smoker   . Smokeless tobacco: Never Used  . Alcohol Use: No  . Drug Use: No  . Sexual Activity: Not on file   Other Topics Concern  . Not on file   Social History Narrative   Married 101 years   1 daughter age 74   Retired    Family History  Problem Relation Age of Onset  . Heart disease Mother   . Diabetes Mother     BP 180/91 mmHg  Pulse 60  Ht 6' (1.829 m)  Wt 200 lb (90.719 kg)  BMI 27.12 kg/m2  Review of Systems: See HPI above.    Objective:   Physical Exam:  Gen: NAD  Left wrist: Moderate swelling mainly dorsally.  No bruising, other complaints. TTP distal radius and ulna.  No scaphoid, other wrist, hand, forearm, elbow tenderness. Mod limitation of motion all directions. FROM digits with 5/5 strength. Sensation intact to light touch.  Right wrist: FROM without pain.  Chest: Bruising, mild swelling lateral chest wall. No palpable stepoffs TTP over 6th-8th rib areas laterally.  Lungs: CTAB without wheezes, rales, rhonchi.    Assessment & Plan:  1. Left wrist injury - displaced intraarticular distal radius fracture in addition to ulnar styloid fracture.  Concerned about the amount of displacement of distal radius component that this will require ORIF.  Placed him in double sugar tong splint with sling, referral placed to hand surgery.  2. Left 7th rib fracture - no pneumothorax present.  Reassured.  Expect 6-8 weeks to fully heal.  Ibuprofen tid prn.  Icing as needed.

## 2015-11-09 NOTE — Assessment & Plan Note (Signed)
Left 7th rib fracture - no pneumothorax present.  Reassured.  Expect 6-8 weeks to fully heal.  Ibuprofen tid prn.  Icing as needed.

## 2015-11-09 NOTE — Assessment & Plan Note (Signed)
displaced intraarticular distal radius fracture in addition to ulnar styloid fracture.  Concerned about the amount of displacement of distal radius component that this will require ORIF.  Placed him in double sugar tong splint with sling, referral placed to hand surgery.

## 2015-11-11 NOTE — Addendum Note (Signed)
Addended by: Sherrie George F on: 11/11/2015 01:16 PM   Modules accepted: Orders

## 2015-12-08 ENCOUNTER — Encounter: Payer: Self-pay | Admitting: Physical Therapy

## 2015-12-08 ENCOUNTER — Ambulatory Visit: Payer: Medicare Other | Attending: Orthopedic Surgery | Admitting: Physical Therapy

## 2015-12-08 DIAGNOSIS — M25632 Stiffness of left wrist, not elsewhere classified: Secondary | ICD-10-CM | POA: Diagnosis present

## 2015-12-08 DIAGNOSIS — M7989 Other specified soft tissue disorders: Secondary | ICD-10-CM | POA: Insufficient documentation

## 2015-12-08 DIAGNOSIS — M25532 Pain in left wrist: Secondary | ICD-10-CM | POA: Diagnosis not present

## 2015-12-08 NOTE — Therapy (Signed)
[place], [place], [address] Phone: [phone]   Fax:  [phone]  Physical Therapy Evaluation  Patient Details  Name: Bryan Rivers MRN: [id number] Date of Birth: 1941/06/18 Referring Provider: Apolonio Schneiders  Encounter Date: 12/08/2015      PT End of Session - 12/08/15 1656    Visit Number 1   Date for PT Re-Evaluation 02/05/16   PT Start Time [id number]   PT Stop Time 1650   PT Time Calculation (min) 53 min   Activity Tolerance Patient limited by pain   Behavior During Therapy [facility] for tasks assessed/performed      Past Medical History  Diagnosis Date  . Morbid obesity ([place]) 02/11/2013    Past Surgical History  Procedure Laterality Date  . Appendectomy  1950    age 15    There were no vitals filed for this visit.  Visit Diagnosis:  Left wrist pain - Plan: PT plan of care cert/re-cert  Stiffness of wrist joint, left - Plan: PT plan of care cert/re-cert  Swelling of limb - Plan: PT plan of care cert/re-cert      Subjective Assessment - 12/08/15 1559    Subjective Patient reports that about 11/05/15 he fell off a ladder and sustained a left distal radius and ulna fracture, he underwent an ORIF 3 days later.  He reports that he was in a full arm cast for about 6 weeks.  He reports he is very active.   Limitations Lifting;House hold activities   Patient Stated Goals get back to golf and exercise   Currently in Pain? Yes   Pain Score 3    Pain Location Wrist   Pain Orientation Left   Pain Descriptors / Indicators Aching;Sharp   Pain Type Acute pain   Pain Onset More than a month ago   Pain Frequency Constant   Aggravating Factors  any motions and lifting   Pain Relieving Factors rest, not moving   Effect of Pain on Daily Activities difficulty with all activities            [facility] PT Assessment - 12/08/15 0001    Assessment   Medical Diagnosis s/p left ORIF of the left wrist with  radial and ulnar fracture   Referring Provider [place]   Hand Dominance Left   Prior Therapy none   Precautions   Precautions None   Balance Screen   Has the patient fallen in the past 6 months Yes   How many times? 1   Has the patient had a decrease in activity level because of a fear of falling?  No   Is the patient reluctant to leave their home because of a fear of falling?  No   Home Environment   Additional Comments does his own housework and yardwork   Prior Function   Level of Independence Independent   Vocation Retired   Leisure exercises at gym 3x/week, plays golf 3 x/week   Observation/Other Assessments-Edema    Edema --  Significant edema of the wrist and fingers   Posture/Postural Control   Posture Comments gaurds left hand with any activity   AROM   Overall AROM Comments DIP 30 degrees, PIP's 45 degrees   AROM Assessment Site --  unalbe to make a fist, finger tips cannot touch palm   Left Wrist Extension 12 Degrees   Left Wrist Flexion 10 Degrees   Left Wrist Radial Deviation 0 Degrees   Left Wrist Ulnar  Deviation 5 Degrees   PROM   Left Wrist Extension 20 Degrees   Left Wrist Flexion 15 Degrees   Left Wrist Radial Deviation 0 Degrees   Left Wrist Ulnar Deviation 6 Degrees   Strength   Overall Strength Comments grip strength left 15#, right 100#   Palpation   Palpation comment significant edema of the left wrist, stitches are intact                   [facility] Adult PT Treatment/Exercise - Dec 28, 2015 0001    Exercises   Exercises Wrist   Wrist Exercises   Other wrist exercises weighted ball bouncing 2 ways and 2 sizes of balls   Other wrist exercises UBE level 1 x 4 minutes                  PT Short Term Goals - 28-Dec-2015 1714    PT SHORT TERM GOAL #1   Title independent with initial HEP   Time 1   Period Weeks   Status New           PT Long Term Goals - 12/28/15 1714    PT LONG TERM GOAL #1   Title increase grip strength of  the left hand to 50#   Time 8   Period Weeks   Status New   PT LONG TERM GOAL #2   Title decrease pain with activity, dressing, ADL's by 50%   Time 8   Period Weeks   Status New   PT LONG TERM GOAL #3   Title increase AROM of the left wrist flexion and extension to 45 degrees   Time 8   Period Weeks   Status New   PT LONG TERM GOAL #4   Title return to gym setting safely   Period Weeks   Status New               Plan - 12-28-15 1701    Clinical Impression Statement Patient with a fall off of a ladder in December and sustained a fracture of the distal ulna and radius, he underwent ORIF of the left wrist, he was in a cast for 6 weeks.  He has very limited ROM and strength with some significant edema.   Pt will benefit from skilled therapeutic intervention in order to improve on the following deficits Decreased coordination;Decreased range of motion;Decreased scar mobility;Decreased strength;Increased edema;Impaired UE functional use;Pain   Rehab Potential Good   PT Frequency 2x / week   PT Duration 4 weeks   PT Treatment/Interventions Electrical Stimulation;Cryotherapy;ADLs/Self Care Home Management;Therapeutic exercise;Manual techniques;Patient/family education   PT Next Visit Plan Slowly add exercises as we did today, some PROM of the wirst, forearm and fingers   Consulted and Agree with Plan of Care Patient          G-Codes - 12-28-15 1716    Functional Assessment Tool Used foto 58% limitation   Functional Limitation Carrying, moving and handling objects   Carrying, Moving and Handling Objects Current Status [id number]) At least 40 percent but less than 60 percent impaired, limited or restricted   Carrying, Moving and Handling Objects Goal Status [id number]) At least 20 percent but less than 40 percent impaired, limited or restricted       Problem List Patient Active Problem List   Diagnosis Date Noted  . Left wrist injury 11/09/2015  . Left rib fracture 11/09/2015  .  Morbid obesity ([place]) 02/11/2013  . Hyperglycemia 11/04/2012  . Unspecified essential  hypertension 09/05/2011  . Hyperlipidemia 09/05/2011  . Elevated PSA 09/05/2011    Sumner Boast., PT 12/08/2015, 5:18 PM  [place] [address] [place], [place], [address] Phone: [phone]   Fax:  [phone]  Name: [place] MRN: [id number] Date of Birth: Aug 21, 1941

## 2015-12-08 NOTE — Patient Instructions (Signed)
Wrist: Flexion   Rest arm with elbow on padded surface. Let wrist drop down. Apply gentle downward push with fingers of other hand. Hold __20__ seconds. Repeat __5__ times. Do _2___ sessions per day. CAUTION: Stretch slowly and gently. Do not force joint. Activity: Rest chin on back of hand with elbow on firm surface.*    Wrist Extension: Lowering (Eccentric), Assist - Elbow Extended   Arm on table, elbow straight, palm down. Use other hand to lift affected hand, bending wrist up. Slowly lower hand for 3-5 seconds. _10__ reps per set, _2__ sets, _2_ times per day.   Composite Extension (Passive Flexor Stretch)   Sitting with elbows on table and palms together, slowly lower wrists toward table until stretch is felt. Be sure to keep palms together throughout stretch. Hold _20___ seconds. Relax. Repeat _5___ times. Do __2__ sessions per day.  

## 2015-12-10 ENCOUNTER — Encounter: Payer: Self-pay | Admitting: Physical Therapy

## 2015-12-10 ENCOUNTER — Ambulatory Visit: Payer: Medicare Other | Admitting: Physical Therapy

## 2015-12-10 DIAGNOSIS — M25632 Stiffness of left wrist, not elsewhere classified: Secondary | ICD-10-CM

## 2015-12-10 DIAGNOSIS — M7989 Other specified soft tissue disorders: Secondary | ICD-10-CM

## 2015-12-10 DIAGNOSIS — M25532 Pain in left wrist: Secondary | ICD-10-CM | POA: Diagnosis not present

## 2015-12-10 NOTE — Therapy (Signed)
[place], [place], [address] Phone: [phone]   Fax:  [phone]  Physical Therapy Treatment  Patient Details  Name: [place] MRN: [id number] Date of Birth: 1941/03/11 Referring Provider: Apolonio Schneiders  Encounter Date: 12/10/2015      PT End of Session - 12/10/15 1052    Visit Number 2   PT Start Time 1011   PT Stop Time 1100   PT Time Calculation (min) 49 min      Past Medical History  Diagnosis Date  . Morbid obesity ([place]) 02/11/2013    Past Surgical History  Procedure Laterality Date  . Appendectomy  1950    age 76    There were no vitals filed for this visit.  Visit Diagnosis:  Swelling of limb  Stiffness of wrist joint, left  Left wrist pain      Subjective Assessment - 12/10/15 1007    Subjective "Its getting better day by day"   Currently in Pain? No/denies   Pain Score 0-No pain                         [facility] Adult PT Treatment/Exercise - 12/10/15 0001    Exercises   Exercises Wrist   Wrist Exercises   Forearm Supination 20 reps   Wrist Flexion 10 reps;Left  2 sets   Bar Weights/Barbell (Wrist Flexion) 2 lbs   Wrist Extension 10 reps  2 sets    Bar Weights/Barbell (Wrist Extension) 2 lbs   Other wrist exercises weighted ball bouncing 2 ways and 2 sizes of balls   Other wrist exercises UBE level 2 x 6 minutes, Egg squeezes orange 2x20    Modalities   Modalities Cryotherapy   Cryotherapy   Number Minutes Cryotherapy 10 Minutes   Cryotherapy Location Wrist   Type of Cryotherapy Ice massage   Manual Therapy   Manual Therapy Passive ROM   Manual therapy comments PROM taken to end ranges multiple times and held in all directions.   Passive ROM L wrist all directions                  PT Short Term Goals - 12/10/15 1056    PT SHORT TERM GOAL #1   Title independent with initial HEP   Status Achieved           PT Long Term  Goals - 12/08/15 1714    PT LONG TERM GOAL #1   Title increase grip strength of the left hand to 50#   Time 8   Period Weeks   Status New   PT LONG TERM GOAL #2   Title decrease pain with activity, dressing, ADL's by 50%   Time 8   Period Weeks   Status New   PT LONG TERM GOAL #3   Title increase AROM of the left wrist flexion and extension to 45 degrees   Time 8   Period Weeks   Status New   PT LONG TERM GOAL #4   Title return to gym setting safely   Period Weeks   Status New               Plan - 12/10/15 1056    Clinical Impression Statement Pt progressed to additional gymn level exercises. C/o pain with MT at end ranges. Pt is well motivated to perform exercises in therapy. Pt with limited ROM but performed resisted exercises with available motion.  Pt will benefit from skilled therapeutic intervention in order to improve on the following deficits Decreased coordination;Decreased range of motion;Decreased scar mobility;Decreased strength;Increased edema;Impaired UE functional use;Pain   Rehab Potential Good   PT Frequency 2x / week   PT Duration 4 weeks   PT Treatment/Interventions Electrical Stimulation;Cryotherapy;ADLs/Self Care Home Management;Therapeutic exercise;Manual techniques;Patient/family education   PT Next Visit Plan continue to Slowly add exercises as we did today, some PROM of the wirst, forearm and fingers        Problem List Patient Active Problem List   Diagnosis Date Noted  . Left wrist injury 11/09/2015  . Left rib fracture 11/09/2015  . Morbid obesity ([place]) 02/11/2013  . Hyperglycemia 11/04/2012  . Unspecified essential hypertension 09/05/2011  . Hyperlipidemia 09/05/2011  . Elevated PSA 09/05/2011    Scot Jun, PTA  12/10/2015, 10:58 AM  [address] [place], [place], [address] Phone: [phone]   Fax:  [phone]  Name: [place] MRN:  [id number] Date of Birth: August 19, 1941

## 2015-12-15 ENCOUNTER — Ambulatory Visit: Payer: Medicare Other | Admitting: Physical Therapy

## 2015-12-15 ENCOUNTER — Encounter: Payer: Self-pay | Admitting: Physical Therapy

## 2015-12-15 DIAGNOSIS — M25532 Pain in left wrist: Secondary | ICD-10-CM | POA: Diagnosis not present

## 2015-12-15 DIAGNOSIS — M25632 Stiffness of left wrist, not elsewhere classified: Secondary | ICD-10-CM

## 2015-12-15 DIAGNOSIS — M7989 Other specified soft tissue disorders: Secondary | ICD-10-CM

## 2015-12-15 NOTE — Therapy (Signed)
[place] [address] [place], [place], [address] Phone: [phone]   Fax:  [phone]  Physical Therapy Treatment  Patient Details  Name: [place] MRN: [id number] Date of Birth: 03-28-1941 Referring Provider: Apolonio Schneiders  Encounter Date: 12/15/2015      PT End of Session - 12/15/15 1040    Visit Number 3   Date for PT Re-Evaluation 02/05/16   PT Start Time 1007   PT Stop Time 1100   PT Time Calculation (min) 53 min   Activity Tolerance Patient limited by pain   Behavior During Therapy [facility] for tasks assessed/performed      Past Medical History  Diagnosis Date  . Morbid obesity ([place]) 02/11/2013    Past Surgical History  Procedure Laterality Date  . Appendectomy  1950    age 16    There were no vitals filed for this visit.  Visit Diagnosis:  Swelling of limb  Left wrist pain  Stiffness of wrist joint, left      Subjective Assessment - 12/15/15 1005    Subjective A little swollen, feel like it gets better every day   Currently in Pain? No/denies            [facility] PT Assessment - 12/15/15 0001    AROM   Left Wrist Extension 30 Degrees   Left Wrist Flexion 30 Degrees   Strength   Overall Strength Comments left 30# grips strength                     [facility] Adult PT Treatment/Exercise - 12/15/15 0001    Elbow Exercises   Forearm Supination 20 reps   Forearm Pronation Limitations used hammer for pronation and supination   Wrist Flexion 10 reps;Left   Bar Weights/Barbell (Wrist Flexion) 2 lbs   Wrist Extension 10 reps   Bar Weights/Barbell (Wrist Extension) 2 lbs   Wrist Exercises   Wrist Radial Deviation Strengthening;20 reps   Wrist Radial Deviation Limitations used hammer   Wrist Ulnar Deviation Strengthening;20 reps   Wrist Ulnar Deviation Limitations used hammer   Other wrist exercises weighted ball bouncing 2 ways and 2 sizes of balls, velcor board for wrist  pro/supination, ball squeeze with neutral wrist and with wrist extension   Other wrist exercises UBE level 5 x 6 minutes, Egg squeezes orange 2x20    Cryotherapy   Number Minutes Cryotherapy 10 Minutes   Cryotherapy Location Wrist   Type of Cryotherapy Ice pack   Manual Therapy   Manual Therapy Passive ROM   Manual therapy comments PROM taken to end ranges multiple times and held in all directions.   Passive ROM L wrist all directions                  PT Short Term Goals - 12/15/15 1042    PT SHORT TERM GOAL #1   Title independent with initial HEP   Status Achieved           PT Long Term Goals - 12/08/15 1714    PT LONG TERM GOAL #1   Title increase grip strength of the left hand to 50#   Time 8   Period Weeks   Status New   PT LONG TERM GOAL #2   Title decrease pain with activity, dressing, ADL's by 50%   Time 8   Period Weeks   Status New   PT LONG TERM GOAL #3   Title increase AROM  of the left wrist flexion and extension to 45 degrees   Time 8   Period Weeks   Status New   PT LONG TERM GOAL #4   Title return to gym setting safely   Period Weeks   Status New               Plan - 12/15/15 1041    Clinical Impression Statement Nice progression of ROM and grip strength from evaluation, he is still very tight and painful with extension and flexion of the wrist.  Still some significant swelling of the wrist   PT Next Visit Plan Continue to push the ROM within his tolerance   Consulted and Agree with Plan of Care Patient        Problem List Patient Active Problem List   Diagnosis Date Noted  . Left wrist injury 11/09/2015  . Left rib fracture 11/09/2015  . Morbid obesity ([place]) 02/11/2013  . Hyperglycemia 11/04/2012  . Unspecified essential hypertension 09/05/2011  . Hyperlipidemia 09/05/2011  . Elevated PSA 09/05/2011    Sumner Boast., PT 12/15/2015, 10:43 AM  [facility] [address] [place], [place], [address] Phone: [phone]   Fax:  [phone]  Name: [place] MRN: [id number] Date of Birth: 1941-04-18

## 2015-12-17 ENCOUNTER — Encounter: Payer: Self-pay | Admitting: Physical Therapy

## 2015-12-17 ENCOUNTER — Ambulatory Visit: Payer: Medicare Other | Admitting: Physical Therapy

## 2015-12-17 DIAGNOSIS — M25632 Stiffness of left wrist, not elsewhere classified: Secondary | ICD-10-CM

## 2015-12-17 DIAGNOSIS — M7989 Other specified soft tissue disorders: Secondary | ICD-10-CM

## 2015-12-17 DIAGNOSIS — M25532 Pain in left wrist: Secondary | ICD-10-CM | POA: Diagnosis not present

## 2015-12-17 NOTE — Therapy (Signed)
[place], [place], [address] Phone: [phone]   Fax:  [phone]  Physical Therapy Treatment  Patient Details  Name: [place] MRN: [id number] Date of Birth: 07-07-1941 Referring Provider: Apolonio Schneiders  Encounter Date: 12/17/2015      PT End of Session - 12/17/15 1138    Visit Number 4   Date for PT Re-Evaluation 02/05/16   PT Start Time 1059   PT Stop Time 1148   PT Time Calculation (min) 49 min   Activity Tolerance Patient limited by pain   Behavior During Therapy [facility] for tasks assessed/performed      Past Medical History  Diagnosis Date  . Morbid obesity ([place]) 02/11/2013    Past Surgical History  Procedure Laterality Date  . Appendectomy  1950    age 75    There were no vitals filed for this visit.  Visit Diagnosis:  Swelling of limb  Stiffness of wrist joint, left  Left wrist pain      Subjective Assessment - 12/17/15 1059    Subjective "I going great, still a little bit stiff"   Currently in Pain? No/denies   Pain Score 0-No pain                         [facility] Adult PT Treatment/Exercise - 12/17/15 0001    Exercises   Exercises Wrist   Wrist Exercises   Forearm Pronation Limitations with hammer    Wrist Flexion Left;20 reps   Bar Weights/Barbell (Wrist Flexion) 2 lbs   Wrist Extension 20 reps   Bar Weights/Barbell (Wrist Extension) 2 lbs   Wrist Radial Deviation Strengthening;20 reps   Wrist Radial Deviation Limitations --  hammer   Wrist Ulnar Deviation Strengthening;20 reps   Wrist Ulnar Deviation Limitations use hammer    Other wrist exercises weighted ball bouncing 2 ways and 2 sizes of balls   Other wrist exercises UBE level 5 x 6 minutes, Egg squeezes orange 2x20    Cryotherapy   Number Minutes Cryotherapy 10 Minutes   Cryotherapy Location Wrist   Type of Cryotherapy Ice pack   Manual Therapy   Manual Therapy Passive ROM   Manual  therapy comments PROM taken to end ranges multiple times and held in all directions.   Passive ROM L wrist all directions                  PT Short Term Goals - 12/15/15 1042    PT SHORT TERM GOAL #1   Title independent with initial HEP   Status Achieved           PT Long Term Goals - 12/08/15 1714    PT LONG TERM GOAL #1   Title increase grip strength of the left hand to 50#   Time 8   Period Weeks   Status New   PT LONG TERM GOAL #2   Title decrease pain with activity, dressing, ADL's by 50%   Time 8   Period Weeks   Status New   PT LONG TERM GOAL #3   Title increase AROM of the left wrist flexion and extension to 45 degrees   Time 8   Period Weeks   Status New   PT LONG TERM GOAL #4   Title return to gym setting safely   Period Weeks   Status New  Plan - 12/17/15 1140    Clinical Impression Statement Continues to progress well in PT. Has some pain with wrist extension and radial deviation. Continues to have significant swelling, but able to complete all exercises with available ROM.   Pt will benefit from skilled therapeutic intervention in order to improve on the following deficits Decreased coordination;Decreased range of motion;Decreased scar mobility;Decreased strength;Increased edema;Impaired UE functional use;Pain   Rehab Potential Good   PT Frequency 2x / week   PT Duration 4 weeks   PT Treatment/Interventions Electrical Stimulation;Cryotherapy;ADLs/Self Care Home Management;Therapeutic exercise;Manual techniques;Patient/family education   PT Next Visit Plan Continue to push the ROM within his tolerance        Problem List Patient Active Problem List   Diagnosis Date Noted  . Left wrist injury 11/09/2015  . Left rib fracture 11/09/2015  . Morbid obesity ([place]) 02/11/2013  . Hyperglycemia 11/04/2012  . Unspecified essential hypertension 09/05/2011  . Hyperlipidemia 09/05/2011  . Elevated PSA 09/05/2011    Scot Jun, PTA  12/17/2015, 11:43 AM  [place] [address] [place], [place], [address] Phone: [phone]   Fax:  [phone]  Name: [place] MRN: [id number] Date of Birth: Oct 16, 1941

## 2015-12-22 ENCOUNTER — Encounter: Payer: Self-pay | Admitting: Physical Therapy

## 2015-12-22 ENCOUNTER — Ambulatory Visit: Payer: Medicare Other | Admitting: Physical Therapy

## 2015-12-22 DIAGNOSIS — M25532 Pain in left wrist: Secondary | ICD-10-CM | POA: Diagnosis not present

## 2015-12-22 DIAGNOSIS — M25632 Stiffness of left wrist, not elsewhere classified: Secondary | ICD-10-CM

## 2015-12-22 DIAGNOSIS — M7989 Other specified soft tissue disorders: Secondary | ICD-10-CM

## 2015-12-22 NOTE — Therapy (Signed)
[address] [place], [place], [address] Phone: [phone]   Fax:  [phone]  Physical Therapy Treatment  Patient Details  Name: Bryan Rivers MRN: [id number] Date of Birth: 08-Feb-1941 Referring Provider: Apolonio Schneiders  Encounter Date: 12/22/2015      PT End of Session - 12/22/15 1351    Visit Number 5   Date for PT Re-Evaluation 02/05/16   PT Start Time 1308   PT Stop Time 1400   PT Time Calculation (min) 52 min   Activity Tolerance Patient limited by pain   Behavior During Therapy [facility] for tasks assessed/performed      Past Medical History  Diagnosis Date  . Morbid obesity ([place]) 02/11/2013    Past Surgical History  Procedure Laterality Date  . Appendectomy  1950    age 75    There were no vitals filed for this visit.  Visit Diagnosis:  Swelling of limb  Stiffness of wrist joint, left  Left wrist pain      Subjective Assessment - 12/22/15 1311    Subjective Doing better everyday, still swollen and a few times will be painful with quick motions   Currently in Pain? No/denies            [facility] PT Assessment - 12/22/15 0001    AROM   Left Wrist Extension 45 Degrees   Left Wrist Flexion 45 Degrees   Strength   Overall Strength Comments 50# left grip strength                     [facility] Adult PT Treatment/Exercise - 12/22/15 0001    Elbow Exercises   Forearm Supination 20 reps   Forearm Pronation Limitations hammer   Wrist Flexion Left;20 reps   Bar Weights/Barbell (Wrist Flexion) 2 lbs   Wrist Extension 20 reps   Bar Weights/Barbell (Wrist Extension) 2 lbs   Wrist Exercises   Wrist Radial Deviation Strengthening;20 reps   Wrist Radial Deviation Limitations hammer   Wrist Ulnar Deviation Strengthening;20 reps   Wrist Ulnar Deviation Limitations hammer   Other wrist exercises weighted ball bouncing 2 ways and 2 sizes of balls   Other wrist exercises UBE level 5 x 6  minutes, Egg squeezes orange 2x20 , wall push two ways   Cryotherapy   Number Minutes Cryotherapy 10 Minutes   Cryotherapy Location Wrist   Type of Cryotherapy Ice pack   Manual Therapy   Manual Therapy Passive ROM   Manual therapy comments PROM taken to end ranges multiple times and held in all directions.   Passive ROM L wrist all directions                  PT Short Term Goals - 12/15/15 1042    PT SHORT TERM GOAL #1   Title independent with initial HEP   Status Achieved           PT Long Term Goals - 12/22/15 1353    PT LONG TERM GOAL #1   Title increase grip strength of the left hand to 50#   Status Partially Met   PT LONG TERM GOAL #2   Title decrease pain with activity, dressing, ADL's by 50%   Status Partially Met               Plan - 12/22/15 1352    Clinical Impression Statement Patient with 15 degrees more extension and flexion than last week and 15# stronger  with frip.  He is still painful and tight iwth wrist flexion and extension   PT Next Visit Plan Continue to push the ROM within his tolerance   Consulted and Agree with Plan of Care Patient        Problem List Patient Active Problem List   Diagnosis Date Noted  . Left wrist injury 11/09/2015  . Left rib fracture 11/09/2015  . Morbid obesity ([place]) 02/11/2013  . Hyperglycemia 11/04/2012  . Unspecified essential hypertension 09/05/2011  . Hyperlipidemia 09/05/2011  . Elevated PSA 09/05/2011    Sumner Boast., PT 12/22/2015, 1:55 PM  [address] [place], [place], [address] Phone: [phone]   Fax:  [phone]  Name: Fairley Copher MRN: [id number] Date of Birth: Nov 08, 1941

## 2015-12-24 ENCOUNTER — Encounter: Payer: Self-pay | Admitting: Physical Therapy

## 2015-12-24 ENCOUNTER — Ambulatory Visit: Payer: Medicare Other | Admitting: Physical Therapy

## 2015-12-24 DIAGNOSIS — M25532 Pain in left wrist: Secondary | ICD-10-CM | POA: Diagnosis not present

## 2015-12-24 DIAGNOSIS — M25632 Stiffness of left wrist, not elsewhere classified: Secondary | ICD-10-CM

## 2015-12-24 DIAGNOSIS — M7989 Other specified soft tissue disorders: Secondary | ICD-10-CM

## 2015-12-24 NOTE — Therapy (Signed)
[place] [address] [place], [place], [address] Phone: [phone]   Fax:  [phone]  Physical Therapy Treatment  Patient Details  Name: Bryan Rivers MRN: [id number] Date of Birth: [birth year] Referring Provider: Apolonio Schneiders  Encounter Date: 12/24/2015      PT End of Session - 12/24/15 1147    Visit Number 6   Date for PT Re-Evaluation 02/05/16   PT Start Time 1101   PT Stop Time 1156   PT Time Calculation (min) 55 min   Activity Tolerance Patient limited by pain   Behavior During Therapy [facility] for tasks assessed/performed      Past Medical History  Diagnosis Date  . Morbid obesity ([place]) 02/11/2013    Past Surgical History  Procedure Laterality Date  . Appendectomy  1950    age 75    There were no vitals filed for this visit.  Visit Diagnosis:  Left wrist pain  Stiffness of wrist joint, left  Swelling of limb      Subjective Assessment - 12/24/15 1103    Subjective Pt stated that the went to the gym this morning and his wrist felt looser, wrist usually stiff in the morning    Currently in Pain? No/denies   Pain Score 0-No pain                         [facility] Adult PT Treatment/Exercise - 12/24/15 0001    Elbow Exercises   Forearm Supination 20 reps   Forearm Pronation Limitations hammer   Wrist Flexion Left;20 reps   Bar Weights/Barbell (Wrist Flexion) 2 lbs   Wrist Extension 20 reps   Bar Weights/Barbell (Wrist Extension) 2 lbs   Wrist Exercises   Wrist Radial Deviation Strengthening;20 reps   Wrist Radial Deviation Limitations hammer    Wrist Ulnar Deviation Strengthening;20 reps   Wrist Ulnar Deviation Limitations hammer    Other wrist exercises weighted ball bouncing 2 ways and 2 sizes of balls   Other wrist exercises UBE level 5 x 6 minutes, Egg squeezes orange 2x20 , wall push ups x10   Cryotherapy   Number Minutes Cryotherapy 10 Minutes   Cryotherapy Location Wrist   Type of Cryotherapy Ice pack   Manual Therapy   Manual Therapy Passive ROM   Manual therapy comments PROM taken to end ranges multiple times and held in all directions.   Passive ROM L wrist all directions                  PT Short Term Goals - 12/15/15 1042    PT SHORT TERM GOAL #1   Title independent with initial HEP   Status Achieved           PT Long Term Goals - 12/22/15 1353    PT LONG TERM GOAL #1   Title increase grip strength of the left hand to 50#   Status Partially Met   PT LONG TERM GOAL #2   Title decrease pain with activity, dressing, ADL's by 50%   Status Partially Met               Plan - 12/24/15 1147    Clinical Impression Statement Pain with MT at end range Extension > Flexion. Pt completed all exercises with available ROM. Pt can be hypo verbal at times and require cues to redirect.   Pt will benefit from skilled therapeutic intervention in order to improve on the  following deficits Decreased coordination;Decreased range of motion;Decreased scar mobility;Decreased strength;Increased edema;Impaired UE functional use;Pain   Rehab Potential Good   PT Frequency 2x / week   PT Duration 4 weeks   PT Treatment/Interventions Electrical Stimulation;Cryotherapy;ADLs/Self Care Home Management;Therapeutic exercise;Manual techniques;Patient/family education   PT Next Visit Plan Continue to push the ROM within his tolerance        Problem List Patient Active Problem List   Diagnosis Date Noted  . Left wrist injury 11/09/2015  . Left rib fracture 11/09/2015  . Morbid obesity ([place]) 02/11/2013  . Hyperglycemia 11/04/2012  . Unspecified essential hypertension 09/05/2011  . Hyperlipidemia 09/05/2011  . Elevated PSA 09/05/2011    Scot Jun, PTA  12/24/2015, 11:51 AM  [address] [place], [place], [address] Phone: [phone]   Fax:  [phone]  Name:  Bryan Rivers MRN: [id number] Date of Birth: [birth year]

## 2015-12-29 ENCOUNTER — Ambulatory Visit: Payer: Medicare Other | Attending: Orthopedic Surgery | Admitting: Physical Therapy

## 2015-12-29 ENCOUNTER — Encounter: Payer: Self-pay | Admitting: Physical Therapy

## 2015-12-29 DIAGNOSIS — M7989 Other specified soft tissue disorders: Secondary | ICD-10-CM | POA: Diagnosis present

## 2015-12-29 DIAGNOSIS — M25532 Pain in left wrist: Secondary | ICD-10-CM | POA: Insufficient documentation

## 2015-12-29 DIAGNOSIS — M25632 Stiffness of left wrist, not elsewhere classified: Secondary | ICD-10-CM | POA: Diagnosis present

## 2015-12-29 NOTE — Therapy (Signed)
[place] [address] [place], [place], [address] Phone: [phone]   Fax:  [phone]  Physical Therapy Treatment  Patient Details  Name: Bryan Rivers MRN: [id number] Date of Birth: 1941-04-03 Referring Provider: Apolonio Schneiders  Encounter Date: 12/29/2015      PT End of Session - 12/29/15 1337    Visit Number 7   Date for PT Re-Evaluation 02/05/16   PT Start Time 1301   PT Stop Time 1346   PT Time Calculation (min) 45 min   Activity Tolerance Patient limited by pain   Behavior During Therapy [facility] for tasks assessed/performed      Past Medical History  Diagnosis Date  . Morbid obesity ([place]) 02/11/2013    Past Surgical History  Procedure Laterality Date  . Appendectomy  1950    age 75    There were no vitals filed for this visit.  Visit Diagnosis:  Left wrist pain  Stiffness of wrist joint, left  Swelling of limb      Subjective Assessment - 12/29/15 1301    Subjective Pt's MD thinks his wrist is healing well and doing better. He states he has been to the gym today and worked out.    Currently in Pain? No/denies                         [facility] Adult PT Treatment/Exercise - 12/29/15 0001    Exercises   Exercises Wrist   Wrist Exercises   Forearm Supination 20 reps   Forearm Pronation Limitations hammer   Wrist Flexion Left;20 reps   Bar Weights/Barbell (Wrist Flexion) 2 lbs   Wrist Extension 20 reps   Bar Weights/Barbell (Wrist Extension) 2 lbs   Wrist Radial Deviation Strengthening;20 reps   Wrist Radial Deviation Limitations hammer   Wrist Ulnar Deviation Strengthening;20 reps   Wrist Ulnar Deviation Limitations hammer   Other wrist exercises weighted ball bouncing 2 ways and 2 sizes of balls, velcro board for pronation and supination   Other wrist exercises UBE level 5 x 6 minutes, egg squeezes 2 x 20, wall push ups x 10   Modalities   Modalities Cryotherapy   Cryotherapy   Number Minutes Cryotherapy 10 Minutes   Cryotherapy Location Wrist   Type of Cryotherapy Ice pack   Manual Therapy   Manual Therapy Passive ROM   Manual therapy comments PROM taken to end ranges multiple times and held in all directions.   Passive ROM L wrist all directions                  PT Short Term Goals - 12/15/15 1042    PT SHORT TERM GOAL #1   Title independent with initial HEP   Status Achieved           PT Long Term Goals - 12/22/15 1353    PT LONG TERM GOAL #1   Title increase grip strength of the left hand to 50#   Status Partially Met   PT LONG TERM GOAL #2   Title decrease pain with activity, dressing, ADL's by 50%   Status Partially Met               Plan - 12/29/15 1339    Clinical Impression Statement Some pain with MT and end ranges. Pt was able to complete all exercises today.    Pt will benefit from skilled therapeutic intervention in order to improve on the following  deficits Decreased coordination;Decreased range of motion;Decreased scar mobility;Decreased strength;Increased edema;Impaired UE functional use;Pain   Rehab Potential Good   PT Frequency 2x / week   PT Duration 4 weeks   PT Treatment/Interventions Electrical Stimulation;Cryotherapy;ADLs/Self Care Home Management;Therapeutic exercise;Manual techniques;Patient/family education   PT Next Visit Plan Education on gym machines and how to pregress weights and reps as well as wrist flexion and extension stretches   Consulted and Agree with Plan of Care Patient        Problem List Patient Active Problem List   Diagnosis Date Noted  . Left wrist injury 11/09/2015  . Left rib fracture 11/09/2015  . Morbid obesity ([place]) 02/11/2013  . Hyperglycemia 11/04/2012  . Unspecified essential hypertension 09/05/2011  . Hyperlipidemia 09/05/2011  . Elevated PSA 09/05/2011    Barbette Merino, SPT 12/29/2015, 1:43 PM  [address] [place], [place], [address] Phone: [phone]   Fax:  [phone]  Name: Olyver Hawes MRN: [id number] Date of Birth: 1940-12-13

## 2015-12-31 ENCOUNTER — Ambulatory Visit: Payer: Medicare Other | Admitting: Physical Therapy

## 2015-12-31 ENCOUNTER — Encounter: Payer: Self-pay | Admitting: Physical Therapy

## 2015-12-31 DIAGNOSIS — M7989 Other specified soft tissue disorders: Secondary | ICD-10-CM

## 2015-12-31 DIAGNOSIS — M25532 Pain in left wrist: Secondary | ICD-10-CM | POA: Diagnosis not present

## 2015-12-31 DIAGNOSIS — M25632 Stiffness of left wrist, not elsewhere classified: Secondary | ICD-10-CM

## 2015-12-31 NOTE — Therapy (Addendum)
[place] [address] [place], [place], [address] Phone: [phone]   Fax:  [phone]  Physical Therapy Treatment  Patient Details  Name: Bryan Rivers MRN: [id number] Date of Birth: August 21, 1941 Referring Provider: Apolonio Schneiders  Encounter Date: 12/31/2015      PT End of Session - 12/31/15 1141    Visit Number 8   Date for PT Re-Evaluation 02/05/16   PT Start Time 1101   PT Stop Time 1151   PT Time Calculation (min) 50 min   Activity Tolerance Patient limited by pain   Behavior During Therapy [facility] for tasks assessed/performed      Past Medical History  Diagnosis Date  . Morbid obesity ([place]) 02/11/2013    Past Surgical History  Procedure Laterality Date  . Appendectomy  1950    age 71    There were no vitals filed for this visit.  Visit Diagnosis:  Swelling of limb  Stiffness of wrist joint, left  Left wrist pain      Subjective Assessment - 12/31/15 1103    Subjective Pt reports that he woke up last night with tingling in fingers and a little pain, But when he woke up no pain.   Currently in Pain? No/denies   Pain Score 0-No pain            [facility] PT Assessment - 12/31/15 0001    AROM   Left Wrist Extension 46 Degrees   Left Wrist Flexion 46 Degrees   Strength   Overall Strength Comments 65# left grip strength                     [facility] Adult PT Treatment/Exercise - 12/31/15 0001    Exercises   Exercises Wrist   Wrist Exercises   Forearm Pronation Limitations hammer   Wrist Flexion Left;20 reps   Bar Weights/Barbell (Wrist Flexion) 3 lbs   Wrist Extension 20 reps   Bar Weights/Barbell (Wrist Extension) 3 lbs   Wrist Radial Deviation Strengthening;20 reps   Wrist Radial Deviation Limitations hammer    Wrist Ulnar Deviation Strengthening;20 reps   Wrist Ulnar Deviation Limitations hammer   Other wrist exercises weighted ball bouncing 2 ways and 2 sizes of balls, Velcro board  for pronation and supination   Other wrist exercises UBE constant work 35 wats x 71mn, egg squeezes 2 x 20, wall push ups x 10; Seated rows #25 2x15 ; Lat pull downs #25 x20   Modalities   Modalities Cryotherapy   Cryotherapy   Number Minutes Cryotherapy 10 Minutes   Cryotherapy Location Wrist   Type of Cryotherapy Ice pack   Manual Therapy   Manual Therapy Passive ROM   Manual therapy comments PROM taken to end ranges multiple times and held in all directions.   Passive ROM L wrist all directions                  PT Short Term Goals - 12/15/15 1042    PT SHORT TERM GOAL #1   Title independent with initial HEP   Status Achieved           PT Long Term Goals - 12/22/15 1353    PT LONG TERM GOAL #1   Title increase grip strength of the left hand to 50#   Status Partially Met   PT LONG TERM GOAL #2   Title decrease pain with activity, dressing, ADL's by 50%   Status Partially Met  Plan - 12/31/15 1142    Clinical Impression Statement Minor increase in grip strength and wrist AROM. Completed exercises well does have difficulty with wall pushups. MD has suggested d/c PT.    Pt will benefit from skilled therapeutic intervention in order to improve on the following deficits Decreased coordination;Decreased range of motion;Decreased scar mobility;Decreased strength;Increased edema;Impaired UE functional use;Pain   Rehab Potential Good   PT Frequency 2x / week   PT Duration 4 weeks   PT Treatment/Interventions Electrical Stimulation;Cryotherapy;ADLs/Self Care Home Management;Therapeutic exercise;Manual techniques;Patient/family education   PT Next Visit Plan D/C        Problem List Patient Active Problem List   Diagnosis Date Noted  . Left wrist injury 11/09/2015  . Left rib fracture 11/09/2015  . Morbid obesity ([place]) 02/11/2013  . Hyperglycemia 11/04/2012  . Unspecified essential hypertension 09/05/2011  . Hyperlipidemia 09/05/2011  .  Elevated PSA 09/05/2011    PHYSICAL THERAPY DISCHARGE SUMMARY  Visits from Start of Care: 8   Plan: Patient agrees to discharge.  Patient goals were partially met. Patient is being discharged due to the physician's request.  ?????       Scot Jun, PTA  12/31/2015, 11:43 AM  [address] [place], [place], [address] Phone: [phone]   Fax:  [phone]  Name: Bryan Rivers MRN: [id number] Date of Birth: 1941-04-27

## 2016-11-29 ENCOUNTER — Encounter: Payer: Self-pay | Admitting: Radiation Oncology

## 2016-12-01 ENCOUNTER — Encounter: Payer: Self-pay | Admitting: Radiation Oncology

## 2016-12-01 NOTE — Telephone Encounter (Signed)
Vata. Please let me know if we have received these records. Thank you. Sam

## 2016-12-01 NOTE — Telephone Encounter (Signed)
Thanks, Vata. Your the best. I let the patient know we got it. Sam

## 2016-12-04 ENCOUNTER — Ambulatory Visit
Admission: RE | Admit: 2016-12-04 | Discharge: 2016-12-04 | Disposition: A | Discharge status: Home / Self Care | Payer: Medicare Other | Source: Ambulatory Visit | Attending: Radiation Oncology | Admitting: Radiation Oncology

## 2016-12-04 ENCOUNTER — Other Ambulatory Visit: Payer: Self-pay | Admitting: Radiation Oncology

## 2016-12-04 DIAGNOSIS — C801 Malignant (primary) neoplasm, unspecified: Secondary | ICD-10-CM

## 2016-12-06 ENCOUNTER — Encounter: Payer: Self-pay | Admitting: Radiation Oncology

## 2016-12-06 NOTE — Progress Notes (Signed)
GU Location of Tumor / Histology: Prostate  If Prostate Cancer, Gleason Score is (3 +4=7) and PSA is (10.52, 07/17)  Bryan Rivers presented months ago with signs/symptoms of:   Biopsies of prostate (if applicable) revealed: [id number]: Adenocarcinoma- Path in chart, Done at [facility]  Past/Anticipated interventions by urology, if any:   Past/Anticipated interventions by medical oncology, if any:  Weight changes, if any: Yes weight gain, reports it is from eating too much  Bowel/Bladder complaints, if any: Soft bowel movement everyday.  Nocturia x2, occasionally has urgency, moderate urine stream, steady flow.  Denies painful urination or leakage of urine  Nausea/Vomiting, if any: no  Pain issues, if any:  no  SAFETY ISSUES:  Prior radiation? no  Pacemaker/ICD? no  Possible current pregnancy? no  Is the patient on methotrexate? no  Current Complaints / other details:  Married, 51 years, one daughter, one grandson, Originally from [place], been here since 1981.

## 2016-12-11 ENCOUNTER — Ambulatory Visit
Admission: RE | Admit: 2016-12-11 | Discharge: 2016-12-11 | Disposition: A | Discharge status: Home / Self Care | Payer: Medicare Other | Source: Ambulatory Visit | Attending: Radiation Oncology | Admitting: Radiation Oncology

## 2016-12-11 ENCOUNTER — Encounter: Payer: Self-pay | Admitting: Medical Oncology

## 2016-12-11 ENCOUNTER — Encounter: Payer: Self-pay | Admitting: Radiation Oncology

## 2016-12-11 VITALS — BP 157/81 | HR 64 | Temp 98.4°F | Resp 12 | Wt 235.0 lb

## 2016-12-11 DIAGNOSIS — Z510 Encounter for antineoplastic radiation therapy: Secondary | ICD-10-CM | POA: Diagnosis present

## 2016-12-11 DIAGNOSIS — C61 Malignant neoplasm of prostate: Secondary | ICD-10-CM

## 2016-12-11 DIAGNOSIS — Z6831 Body mass index (BMI) 31.0-31.9, adult: Secondary | ICD-10-CM | POA: Insufficient documentation

## 2016-12-11 DIAGNOSIS — I10 Essential (primary) hypertension: Secondary | ICD-10-CM | POA: Insufficient documentation

## 2016-12-11 HISTORY — DX: Malignant neoplasm of prostate: C61

## 2016-12-11 HISTORY — DX: Essential (primary) hypertension: I10

## 2016-12-11 NOTE — Progress Notes (Signed)
Radiation Oncology         [phone] ________________________________  Initial Outpatient Consultation  Name: Bryan Rivers MRN: [id number]  Date: 12/11/2016  DOB: [birth year]  [id number], Bryan Belts, MD  Bryan Rivers*   REFERRING PHYSICIAN: Maryclare Rivers*  DIAGNOSIS: 76 y.o. gentleman with stage T1c adenocarcinoma of the prostate with a Gleason's score of 3+4 and a PSA of 10.52    ICD-9-CM ICD-10-CM   1. Malignant neoplasm of prostate ([place]) [place] Ambulatory referral to Urology     CANCELED: CBC with Differential     CANCELED: Comprehensive metabolic panel     CANCELED: Magnesium    HISTORY OF PRESENT ILLNESS:Bryan Rivers is a 76 y.o. gentleman. Patient was referred to Dr. Vito Rivers in urology in June 2016 for an elevated PSA of 8.34. The patient elected to undergo continued monitoring without biopsy. He was noted to have an elevated PSA of 10.52 by his primary care physician, Dr. Oswaldo Rivers in July 2017.  Accordingly, he was referred for evaluation in urology by Dr. Vito Rivers in August 2017,  digital rectal examination was performed at that time revealing no nodularity.  Patient underwent an MRI of the prostate on 08/02/16. It revealed a 1.9 cm lesion in the left mid gland, a 1.2 cm lesion in the right posterior peripheral gland and a 1.3 x 3 cm enlarged node in the right external iliac chain. The patient proceeded to transrectal ultrasound with 12 biopsies of the prostate on 09/07/16.  The prostate volume measured 53 cc.  Out of 8 core biopsies, 2 were positive. The maximum Gleason score was 3+4, and this was seen in Bryan Rivers. PET scan on 10/03/16 showed high uptake in the prostate, Though no evidence of disease outside of the prostate was seen based on this.   The patient reviewed the biopsy results with his urologist and he has kindly been referred today for discussion of potential radiation treatment options.      PREVIOUS RADIATION THERAPY: No  PAST MEDICAL HISTORY:  Past  Medical History:  Diagnosis Date  . Hypertension   . Morbid obesity ([place]) 02/11/2013  . Prostate cancer ([place])     PAST SURGICAL HISTORY: Past Surgical History:  Procedure Laterality Date  . APPENDECTOMY  1950   age 55    FAMILY HISTORY:  Family History  Problem Relation Age of Onset  . Heart disease Mother   . Diabetes Mother     SOCIAL HISTORY:  reports that he has never smoked. He has never used smokeless tobacco. He reports that he does not drink alcohol or use drugs. The patient is married and accompanied by his wife. They live in [place]. They are [place] citizens but emigrated from [place] in the 1980s. 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  ALLERGIES: Patient has no known allergies.  MEDICATIONS:  Current Outpatient Prescriptions  Medication Sig Dispense Refill  . amLODipine (NORVASC) 5 MG tablet     . losartan-hydrochlorothiazide (HYZAAR) 100-25 MG per tablet TAKE 1 TABLET BY MOUTH EVERY DAY 30 tablet 0  . simvastatin (ZOCOR) 40 MG tablet Take 1 tablet (40 mg total) by mouth at bedtime. 30 tablet 11   No current facility-administered medications for this encounter.     REVIEW OF SYSTEMS:  On review of systems, the patient reports that he is doing well overall. He exercises regularly and enjoys playing golf multiple times a week. He denies any chest pain, shortness of breath, cough, fevers, chills, night sweats,  unintended weight changes. He denies any bowel  disturbances, and denies abdominal pain, nausea or vomiting. He denies any new musculoskeletal or joint aches or pains. The patient completed an IPSS and IIEF questionnaire.  His IPSS score was 3-4 indicating mild urinary outflow obstructive symptoms. He reports nocturia x 2. He has occasional urgency, moderate urine stream, with a steady flow. He indicated that his erectile function is able to complete sexual activity most of the time. A complete review of systems is obtained  and is otherwise negative.    PHYSICAL EXAM:    weight is 235 lb (106.6 kg). His oral temperature is 98.4 F (36.9 C). His blood pressure is 157/81 (abnormal) and his pulse is 64. His respiration is 12 and oxygen saturation is 96%.    In general this is a well appearing caucasian male in no acute distress. He is alert and oriented x4 and appropriate throughout the examination. HEENT reveals that the patient is normocephalic, atraumatic. EOMs are intact. PERRLA. Skin is intact without any evidence of gross lesions. Cardiovascular exam reveals a regular rate and rhythm, no clicks rubs or murmurs are auscultated. Chest is clear to auscultation bilaterally. Lymphatic assessment is performed and does not reveal any adenopathy in the cervical, supraclavicular, axillary, or inguinal chains. Abdomen has active bowel sounds in all quadrants and is intact. The abdomen is soft, non tender, non distended. Lower extremities are negative for pretibial pitting edema, deep calf tenderness, cyanosis or clubbing.  KPS = 100  100 - Normal; no complaints; no evidence of disease. 90   - Able to carry on normal activity; minor signs or symptoms of disease. 80   - Normal activity with effort; some signs or symptoms of disease. 29   - Cares for self; unable to carry on normal activity or to do active work. 60   - Requires occasional assistance, but is able to care for most of his personal needs. 50   - Requires considerable assistance and frequent medical care. 32   - Disabled; requires special care and assistance. 76   - Severely disabled; hospital admission is indicated although death not imminent. 79   - Very sick; hospital admission necessary; active supportive treatment necessary. 10   - Moribund; fatal processes progressing rapidly. 0     - Dead  Karnofsky DA, Abelmann [place], Craver LS and Burchenal [facility] [phone]) The use of the nitrogen mustards in the palliative treatment of carcinoma: with particular reference to  bronchogenic carcinoma Cancer 1 634-56   LABORATORY DATA:  Lab Results  Component Value Date   WBC 5.1 10/31/2012   HGB 15.9 10/31/2012   HCT 45.6 10/31/2012   MCV 84.0 10/31/2012   PLT 190 10/31/2012   Lab Results  Component Value Date   NA 140 02/05/2013   K 4.6 02/05/2013   CL 104 02/05/2013   CO2 29 02/05/2013   Lab Results  Component Value Date   ALT 30 10/31/2012   AST 17 10/31/2012   ALKPHOS 56 10/31/2012   BILITOT 0.8 10/31/2012     RADIOGRAPHY: No results found.    IMPRESSION/PLAN:  1. 76 y.o. gentleman with intermediate risk, T1c adenocarcinoma of the prostate with a Gleason Score of 3+4, and a PSA of 8.34. Dr. Tammi Klippel discusses the pathology findings, as well as imaging and outline how his T-Stage, Gleason's Score, and PSA put him into the intermediate risk group.  Accordingly he is eligible for a variety of potential treatment options including external beam radiation, radioactive  seed implant, or prostatectomy. The patient is not interested in surgical resection, and we discussed radiation treatment in the management of prostate cancer with regard to the logistics and delivery of external beam radiation treatment as well as the logistics and delivery of prostate brachytherapy.  We compared and contrasted each of these approaches. He is also not interested in radioactive seed implant. The patient expressed interest in external beam radiotherapy, and would like to transfer all of his care to [facility]. We will refer him to [facility] urology for stat evaluation and for gold fiducial placement. As he has undergone MRI of the prostate in the past, he can forgo retrograde urethrogram for CT simulation.  We enjoyed meeting with him today, and will look forward to participating in the care of this very nice gentleman.  The above documentation reflects my direct findings during this shared patient visit. Please see the separate note by Dr. Tammi Klippel on this date for the remainder  of the patient's plan of care.     Carola Rhine, PAC     This document serves as a record of services personally performed by Shona Simpson, PA-C and Tyler Pita, MD. It was created on his behalf by Bethann Humble, a trained medical scribe. The creation of this record is based on the scribe's personal observations and the provider's statements to them. This document has been checked and approved by the attending provider.

## 2016-12-12 ENCOUNTER — Telehealth: Payer: Self-pay | Admitting: *Deleted

## 2016-12-12 NOTE — Addendum Note (Signed)
Encounter addended by: Tyler Pita, MD on: 12/12/2016  1:46 PM<BR>    Actions taken: Problem List reviewed

## 2016-12-12 NOTE — Telephone Encounter (Signed)
CALLED PATIENT TO INFORM OF [place] APPT. WITH DR. Romilda Joy BORDEN OF [facility] ON 12-26-16 - ARRIVAL TIME - 9:15 AM , LVM FOR A RETURN CALL

## 2016-12-18 ENCOUNTER — Encounter: Payer: Self-pay | Admitting: Physical Therapy

## 2016-12-27 ENCOUNTER — Telehealth: Payer: Self-pay | Admitting: *Deleted

## 2016-12-27 NOTE — Telephone Encounter (Signed)
Patient to have gold seeds placed on 12-27-16 @ [facility] and his sim will be on 12-29-16 @ 10 am @ Dr. Johny Shears Office, patient aware of these appts.

## 2016-12-29 ENCOUNTER — Encounter: Payer: Self-pay | Admitting: Medical Oncology

## 2016-12-29 ENCOUNTER — Ambulatory Visit
Admission: RE | Admit: 2016-12-29 | Discharge: 2016-12-29 | Disposition: A | Discharge status: Home / Self Care | Payer: Medicare Other | Source: Ambulatory Visit | Attending: Radiation Oncology | Admitting: Radiation Oncology

## 2016-12-29 DIAGNOSIS — C61 Malignant neoplasm of prostate: Secondary | ICD-10-CM

## 2016-12-29 DIAGNOSIS — Z510 Encounter for antineoplastic radiation therapy: Secondary | ICD-10-CM | POA: Diagnosis not present

## 2016-12-29 NOTE — Progress Notes (Signed)
  Radiation Oncology         [phone]) [phone] ________________________________  Name: Bryan Rivers MRN: [id number]  Date: 12/29/2016  DOB: 05/22/1941  SIMULATION AND TREATMENT PLANNING NOTE    ICD-9-CM ICD-10-CM   1. Malignant neoplasm of prostate ([place]) 185 C61     DIAGNOSIS:  76 y.o. gentleman with stage T1c adenocarcinoma of the prostate with a Gleason's score of 3+4 and a PSA of 10.52  NARRATIVE:  The patient was brought to the [facility].  Identity was confirmed.  All relevant records and images related to the planned course of therapy were reviewed.  The patient freely provided informed written consent to proceed with treatment after reviewing the details related to the planned course of therapy. The consent form was witnessed and verified by the simulation staff.  Then, the patient was set-up in a stable reproducible supine position for radiation therapy.  A vacuum lock pillow device was custom fabricated to position his legs in a reproducible immobilized position.  Since the patient had a prostate MRI he will not need catheterization, we will fuse the MRI.with the planning CT.   CT images were obtained.  Surface markings were placed.  The CT images were loaded into the planning software.  Then the prostate target and avoidance structures including the rectum, bladder, bowel and hips were contoured.  Treatment planning then occurred.  The radiation prescription was entered and confirmed.  A total of 1 complex treatment devices were fabricated. I have requested : Intensity Modulated Radiotherapy (IMRT) is medically necessary for this case for the following reason:  Rectal sparing.Marland Kitchen  PLAN:  The patient will receive 78 Gy in 40 fractions.  ________________________________  Sheral Apley Tammi Klippel, M.D.  This document serves as a record of services personally performed by Tyler Pita, MD. It was created on his behalf by Arlyce Harman, a trained medical scribe. The creation of  this record is based on the scribe's personal observations and the provider's statements to them. This document has been checked and approved by the attending provider.

## 2017-01-01 ENCOUNTER — Ambulatory Visit (INDEPENDENT_AMBULATORY_CARE_PROVIDER_SITE_OTHER): Payer: Medicare Other | Admitting: Family Medicine

## 2017-01-01 ENCOUNTER — Encounter: Payer: Self-pay | Admitting: Family Medicine

## 2017-01-01 ENCOUNTER — Ambulatory Visit (HOSPITAL_BASED_OUTPATIENT_CLINIC_OR_DEPARTMENT_OTHER)
Admission: RE | Admit: 2017-01-01 | Discharge: 2017-01-01 | Disposition: A | Discharge status: Home / Self Care | Payer: Medicare Other | Source: Ambulatory Visit | Attending: Family Medicine | Admitting: Family Medicine

## 2017-01-01 VITALS — BP 146/80 | HR 64 | Ht 72.0 in | Wt 225.0 lb

## 2017-01-01 DIAGNOSIS — M545 Low back pain, unspecified: Secondary | ICD-10-CM

## 2017-01-01 DIAGNOSIS — M4686 Other specified inflammatory spondylopathies, lumbar region: Secondary | ICD-10-CM | POA: Insufficient documentation

## 2017-01-01 DIAGNOSIS — S3992XA Unspecified injury of lower back, initial encounter: Secondary | ICD-10-CM | POA: Diagnosis not present

## 2017-01-01 DIAGNOSIS — M5137 Other intervertebral disc degeneration, lumbosacral region: Secondary | ICD-10-CM | POA: Insufficient documentation

## 2017-01-01 NOTE — Patient Instructions (Signed)
You have a severe lumbar strain. Take the meloxicam 15mg  daily with food for 7-10 days then as needed. Ok to take tylenol and tizanidine in addition to this. Do home exercises and stretches every day. Consider physical therapy, different muscle relaxant, prednisone, MRI if not improving (this is unlikely). Follow up with me in 2 weeks for reevaluation.

## 2017-01-02 DIAGNOSIS — S3992XA Unspecified injury of lower back, initial encounter: Secondary | ICD-10-CM | POA: Insufficient documentation

## 2017-01-02 NOTE — Progress Notes (Signed)
PCP: Beckie Salts, MD  Subjective:   HPI: Patient is a 76 y.o. male here for low back pain.  Patient reports about 10 days ago he was reaching into the shower and felt a sharp pain right side low back. No radiation of the pain. No swelling or bruising. No numbness or tingling. Pain has continued at 8/10 level, sharp on right side. Tried tylenol with codeine, ibuprofen. Can't sleep in bed - needs legs propped up a little bit and flexed to feel comfortable. No bowel/bladder dysfunction. Worse when fully extended.  Past Medical History:  Diagnosis Date  . Hypertension   . Morbid obesity ([place]) 02/11/2013  . Prostate cancer [facility])     Current Outpatient Prescriptions on File Prior to Visit  Medication Sig Dispense Refill  . amLODipine (NORVASC) 5 MG tablet     . losartan-hydrochlorothiazide (HYZAAR) 100-25 MG per tablet TAKE 1 TABLET BY MOUTH EVERY DAY 30 tablet 0   No current facility-administered medications on file prior to visit.     Past Surgical History:  Procedure Laterality Date  . APPENDECTOMY  1950   age 65    No Known Allergies  Social History   Social History  . Marital status: Married    Spouse name: N/A  . Number of children: N/A  . Years of education: N/A   Occupational History  . Not on file.   Social History Main Topics  . Smoking status: Never Smoker  . Smokeless tobacco: Never Used  . Alcohol use No  . Drug use: No  . Sexual activity: Not on file   Other Topics Concern  . Not on file   Social History Narrative   Married 51 years   1 daughter age 7   Retired    Family History  Problem Relation Age of Onset  . Heart disease Mother   . Diabetes Mother     BP (!) 146/80   Pulse 64   Ht 6' (1.829 m)   Wt 225 lb (102.1 kg)   BMI 30.52 kg/m   Review of Systems: See HPI above.     Objective:  Physical Exam:  Gen: NAD, comfortable in exam room  Back: No gross deformity, scoliosis. TTP right lumbar paraspinal region.  No  midline or bony TTP. FROM with pain on flexion. Strength LEs 5/5 all muscle groups.   3+ MSRs in patellar and trace achilles tendons, equal bilaterally. Negative SLRs. Sensation intact to light touch bilaterally. Negative logroll bilateral hips Negative fabers and piriformis stretches.   Assessment & Plan:  1. Low back injury - independently reviewed radiographs and no evidence fracture, lesion.  Consistent with severe lumbar strain.  Start meloxicam daily with tylenol, tizanidine as needed (has some of these at home).  Shown home exercises and stretches to do daily.  Consider physical therapy, prednisone, MRI if not improving.  F/u in 2 weeks.

## 2017-01-02 NOTE — Assessment & Plan Note (Signed)
independently reviewed radiographs and no evidence fracture, lesion.  Consistent with severe lumbar strain.  Start meloxicam daily with tylenol, tizanidine as needed (has some of these at home).  Shown home exercises and stretches to do daily.  Consider physical therapy, prednisone, MRI if not improving.  F/u in 2 weeks.

## 2017-01-05 DIAGNOSIS — Z510 Encounter for antineoplastic radiation therapy: Secondary | ICD-10-CM | POA: Diagnosis not present

## 2017-01-09 ENCOUNTER — Ambulatory Visit
Admission: RE | Admit: 2017-01-09 | Discharge: 2017-01-09 | Disposition: A | Discharge status: Home / Self Care | Payer: Medicare Other | Source: Ambulatory Visit | Attending: Radiation Oncology | Admitting: Radiation Oncology

## 2017-01-09 DIAGNOSIS — Z510 Encounter for antineoplastic radiation therapy: Secondary | ICD-10-CM | POA: Diagnosis not present

## 2017-01-10 ENCOUNTER — Ambulatory Visit
Admission: RE | Admit: 2017-01-10 | Discharge: 2017-01-10 | Disposition: A | Discharge status: Home / Self Care | Payer: Medicare Other | Source: Ambulatory Visit | Attending: Radiation Oncology | Admitting: Radiation Oncology

## 2017-01-10 DIAGNOSIS — Z510 Encounter for antineoplastic radiation therapy: Secondary | ICD-10-CM | POA: Diagnosis not present

## 2017-01-11 ENCOUNTER — Ambulatory Visit
Admission: RE | Admit: 2017-01-11 | Discharge: 2017-01-11 | Disposition: A | Discharge status: Home / Self Care | Payer: Medicare Other | Source: Ambulatory Visit | Attending: Radiation Oncology | Admitting: Radiation Oncology

## 2017-01-11 DIAGNOSIS — Z510 Encounter for antineoplastic radiation therapy: Secondary | ICD-10-CM | POA: Diagnosis not present

## 2017-01-12 ENCOUNTER — Ambulatory Visit
Admission: RE | Admit: 2017-01-12 | Discharge: 2017-01-12 | Disposition: A | Discharge status: Home / Self Care | Payer: Medicare Other | Source: Ambulatory Visit | Attending: Radiation Oncology | Admitting: Radiation Oncology

## 2017-01-12 VITALS — BP 142/83 | HR 71 | Resp 18 | Wt 229.6 lb

## 2017-01-12 DIAGNOSIS — C61 Malignant neoplasm of prostate: Secondary | ICD-10-CM

## 2017-01-12 DIAGNOSIS — Z510 Encounter for antineoplastic radiation therapy: Secondary | ICD-10-CM | POA: Diagnosis not present

## 2017-01-12 NOTE — Progress Notes (Signed)
  Radiation Oncology         [phone]   Name: Bryan Rivers MRN: [id number]   Date: 01/12/2017  DOB: 15-Aug-1941   Weekly Radiation Therapy Management    ICD-9-CM ICD-10-CM   1. Malignant neoplasm of prostate ([facility]) 185 C61     Current Dose: 7.8 Gy  Planned Dose:  78 Gy  Narrative The patient presents for routine under treatment assessment.  Weight and vitals stable. Denies pain, dysuria, hematuria, leakage, incontinence, any bowel complaints, or fatigue. Reports nocturia x 2 and a strong steady urine stream without difficulty emptying his bladder.  Set-up films were reviewed. The chart was checked.  Physical Findings  weight is 229 lb 9.6 oz (104.1 kg). His blood pressure is 142/83 (abnormal) and his pulse is 71. His respiration is 18 and oxygen saturation is 100%. . Weight essentially stable.  No significant changes.  Impression The patient is tolerating radiation.  Plan Continue treatment as planned.         Sheral Apley Tammi Klippel, M.D.  This document serves as a record of services personally performed by Tyler Pita, MD. It was created on his behalf by Darcus Austin, a trained medical scribe. The creation of this record is based on the scribe's personal observations and the provider's statements to them. This document has been checked and approved by the attending provider.

## 2017-01-12 NOTE — Progress Notes (Signed)
Weight and vitals stable. Denies pain. Reports nocturia x 2. Denies dysuria, hematuria, leakage or incontinence. Describes a strong steady urine stream without difficulty emptying his bladder. Denies any bowel complaints. Denies fatigue.   BP (!) 142/83 (BP Location: Right Arm, Patient Position: Sitting, Cuff Size: Large)   Pulse 71   Resp 18   Wt 229 lb 9.6 oz (104.1 kg)   SpO2 100%   BMI 31.14 kg/m  Wt Readings from Last 3 Encounters:  01/12/17 229 lb 9.6 oz (104.1 kg)  01/01/17 225 lb (102.1 kg)  12/11/16 235 lb (106.6 kg)

## 2017-01-15 ENCOUNTER — Ambulatory Visit
Admission: RE | Admit: 2017-01-15 | Discharge: 2017-01-15 | Disposition: A | Discharge status: Home / Self Care | Payer: Medicare Other | Source: Ambulatory Visit | Attending: Radiation Oncology | Admitting: Radiation Oncology

## 2017-01-15 ENCOUNTER — Encounter: Payer: Self-pay | Admitting: Medical Oncology

## 2017-01-15 DIAGNOSIS — Z510 Encounter for antineoplastic radiation therapy: Secondary | ICD-10-CM | POA: Diagnosis not present

## 2017-01-15 NOTE — Progress Notes (Signed)
Mr. Bryan Rivers states treatment is going wonderfully. No complaints and he states he is so pleased with his care.

## 2017-01-16 ENCOUNTER — Ambulatory Visit
Admission: RE | Admit: 2017-01-16 | Discharge: 2017-01-16 | Disposition: A | Discharge status: Home / Self Care | Payer: Medicare Other | Source: Ambulatory Visit | Attending: Radiation Oncology | Admitting: Radiation Oncology

## 2017-01-16 DIAGNOSIS — Z510 Encounter for antineoplastic radiation therapy: Secondary | ICD-10-CM | POA: Diagnosis not present

## 2017-01-17 ENCOUNTER — Ambulatory Visit
Admission: RE | Admit: 2017-01-17 | Discharge: 2017-01-17 | Disposition: A | Discharge status: Home / Self Care | Payer: Medicare Other | Source: Ambulatory Visit | Attending: Radiation Oncology | Admitting: Radiation Oncology

## 2017-01-17 DIAGNOSIS — Z510 Encounter for antineoplastic radiation therapy: Secondary | ICD-10-CM | POA: Diagnosis not present

## 2017-01-18 ENCOUNTER — Ambulatory Visit
Admission: RE | Admit: 2017-01-18 | Discharge: 2017-01-18 | Disposition: A | Discharge status: Home / Self Care | Payer: Medicare Other | Source: Ambulatory Visit | Attending: Radiation Oncology | Admitting: Radiation Oncology

## 2017-01-18 DIAGNOSIS — Z510 Encounter for antineoplastic radiation therapy: Secondary | ICD-10-CM | POA: Diagnosis not present

## 2017-01-19 ENCOUNTER — Ambulatory Visit
Admission: RE | Admit: 2017-01-19 | Discharge: 2017-01-19 | Disposition: A | Discharge status: Home / Self Care | Payer: Medicare Other | Source: Ambulatory Visit | Attending: Radiation Oncology | Admitting: Radiation Oncology

## 2017-01-19 ENCOUNTER — Encounter: Payer: Self-pay | Admitting: Radiation Oncology

## 2017-01-19 VITALS — BP 139/77 | HR 67 | Temp 98.1°F | Ht 72.0 in | Wt 231.6 lb

## 2017-01-19 DIAGNOSIS — Z510 Encounter for antineoplastic radiation therapy: Secondary | ICD-10-CM | POA: Diagnosis not present

## 2017-01-19 DIAGNOSIS — C61 Malignant neoplasm of prostate: Secondary | ICD-10-CM

## 2017-01-19 NOTE — Progress Notes (Signed)
Bryan Rivers has completed 9 fractions to his prostate.  He denies having pain.  He reports having mild dysuria when starting his urinary stream that started yesterday.  He reports having nocturia 3-4 times a night now.  He has also noticed that his urinary stream is weaker at night.  He denies having hematuria.  He denies having diarrhea or fatigue.   BP 139/77 (BP Location: Right Arm, Patient Position: Sitting)   Pulse 67   Temp 98.1 F (36.7 C) (Oral)   Ht 6' (1.829 m)   Wt 231 lb 9.6 oz (105.1 kg)   SpO2 97%   BMI 31.41 kg/m    Wt Readings from Last 3 Encounters:  01/19/17 231 lb 9.6 oz (105.1 kg)  01/12/17 229 lb 9.6 oz (104.1 kg)  01/01/17 225 lb (102.1 kg)

## 2017-01-19 NOTE — Progress Notes (Signed)
  Radiation Oncology         [phone]   Name: Bryan Rivers MRN: [id number]   Date: 01/19/2017  DOB: May 30, 1941   Weekly Radiation Therapy Management    ICD-9-CM ICD-10-CM   1. Malignant neoplasm of prostate ([facility]) 185 C61     Current Dose: 17.55 Gy  Planned Dose:  78 Gy  Narrative The patient presents for routine under treatment assessment.  Bryan Rivers has completed 9 fractions to his prostate. He denies pain, hematuria, diarrhea, or fatigue. He reports having mild dysuria when starting his urinary stream that started yesterday. He reports having nocturia 3-4 times a night now. He has also noticed that his urinary stream is weaker at night. He reports drinking a lot of tea at night.  Set-up films were reviewed. The chart was checked.  Physical Findings  height is 6' (1.829 m) and weight is 231 lb 9.6 oz (105.1 kg). His oral temperature is 98.1 F (36.7 C). His blood pressure is 139/77 and his pulse is 67. His oxygen saturation is 97%. . Weight essentially stable.  No significant changes.  Impression The patient is tolerating radiation.  Plan Continue treatment as planned.         Sheral Apley Tammi Klippel, M.D.  This document serves as a record of services personally performed by Tyler Pita, MD. It was created on his behalf by Darcus Austin, a trained medical scribe. The creation of this record is based on the scribe's personal observations and the provider's statements to them. This document has been checked and approved by the attending provider.

## 2017-01-22 ENCOUNTER — Ambulatory Visit
Admission: RE | Admit: 2017-01-22 | Discharge: 2017-01-22 | Disposition: A | Discharge status: Home / Self Care | Payer: Medicare Other | Source: Ambulatory Visit | Attending: Radiation Oncology | Admitting: Radiation Oncology

## 2017-01-22 DIAGNOSIS — Z510 Encounter for antineoplastic radiation therapy: Secondary | ICD-10-CM | POA: Diagnosis not present

## 2017-01-23 ENCOUNTER — Ambulatory Visit
Admission: RE | Admit: 2017-01-23 | Discharge: 2017-01-23 | Disposition: A | Discharge status: Home / Self Care | Payer: Medicare Other | Source: Ambulatory Visit | Attending: Radiation Oncology | Admitting: Radiation Oncology

## 2017-01-23 DIAGNOSIS — Z510 Encounter for antineoplastic radiation therapy: Secondary | ICD-10-CM | POA: Diagnosis not present

## 2017-01-24 ENCOUNTER — Ambulatory Visit
Admission: RE | Admit: 2017-01-24 | Discharge: 2017-01-24 | Disposition: A | Discharge status: Home / Self Care | Payer: Medicare Other | Source: Ambulatory Visit | Attending: Radiation Oncology | Admitting: Radiation Oncology

## 2017-01-24 DIAGNOSIS — Z510 Encounter for antineoplastic radiation therapy: Secondary | ICD-10-CM | POA: Diagnosis not present

## 2017-01-25 ENCOUNTER — Ambulatory Visit: Admission: RE | Admit: 2017-01-25 | Payer: Medicare Other | Source: Ambulatory Visit

## 2017-01-25 ENCOUNTER — Ambulatory Visit
Admission: RE | Admit: 2017-01-25 | Discharge: 2017-01-25 | Disposition: A | Discharge status: Home / Self Care | Payer: Medicare Other | Source: Ambulatory Visit | Attending: Radiation Oncology | Admitting: Radiation Oncology

## 2017-01-25 VITALS — BP 150/89 | HR 79 | Resp 18 | Wt 231.0 lb

## 2017-01-25 DIAGNOSIS — C61 Malignant neoplasm of prostate: Secondary | ICD-10-CM

## 2017-01-25 NOTE — Progress Notes (Signed)
Weight and vitals stable. Denies pain. Reports slight increase of dysuria but, denies this is painful. Reports nocturia x 3-4.  Reports urine stream is strong and steady during the day and slightly weaker at night. Denies hematuria. Denies diarrhea. Denies fatigue.    BP (!) 150/89   Pulse 79   Resp 18   Wt 231 lb (104.8 kg)   SpO2 100%   BMI 31.33 kg/m  Wt Readings from Last 3 Encounters:  01/25/17 231 lb (104.8 kg)  01/19/17 231 lb 9.6 oz (105.1 kg)  01/12/17 229 lb 9.6 oz (104.1 kg)

## 2017-01-25 NOTE — Progress Notes (Signed)
  Radiation Oncology         [phone]   Name: Bryan Rivers MRN: [id number]   Date: 01/25/2017  DOB: 04-Jun-1941   Weekly Radiation Therapy Management    ICD-9-CM ICD-10-CM   1. Malignant neoplasm of prostate ([place]) 185 C61     Current Dose: 23.4 Gy  Planned Dose:  78 Gy  Narrative The patient presents for routine under treatment assessment.  Weight and vitals stable. Denies pain, hematuria, diarrhea, or fatigue. Reports slight increase of dysuria. Reports nocturia x 3-4. Notes he has a strong and steady urine stream during the day and slightly weaker at night.   Set-up films were reviewed. The chart was checked.  Physical Findings  weight is 231 lb (104.8 kg). His blood pressure is 150/89 (abnormal) and his pulse is 79. His respiration is 18 and oxygen saturation is 100%. . Weight essentially stable.  No significant changes.  Impression The patient is tolerating radiation.  Plan Continue treatment as planned.         Sheral Apley Tammi Klippel, M.D.  This document serves as a record of services personally performed by Tyler Pita, MD. It was created on his behalf by Bethann Humble, a trained medical scribe. The creation of this record is based on the scribe's personal observations and the provider's statements to them. This document has been checked and approved by the attending provider.

## 2017-01-26 ENCOUNTER — Ambulatory Visit: Payer: Medicare Other

## 2017-01-29 ENCOUNTER — Ambulatory Visit
Admission: RE | Admit: 2017-01-29 | Discharge: 2017-01-29 | Disposition: A | Discharge status: Home / Self Care | Payer: Medicare Other | Source: Ambulatory Visit | Attending: Radiation Oncology | Admitting: Radiation Oncology

## 2017-01-29 DIAGNOSIS — Z510 Encounter for antineoplastic radiation therapy: Secondary | ICD-10-CM | POA: Diagnosis not present

## 2017-01-30 ENCOUNTER — Ambulatory Visit
Admission: RE | Admit: 2017-01-30 | Discharge: 2017-01-30 | Disposition: A | Discharge status: Home / Self Care | Payer: Medicare Other | Source: Ambulatory Visit | Attending: Radiation Oncology | Admitting: Radiation Oncology

## 2017-01-30 DIAGNOSIS — Z510 Encounter for antineoplastic radiation therapy: Secondary | ICD-10-CM | POA: Diagnosis not present

## 2017-01-31 ENCOUNTER — Ambulatory Visit
Admission: RE | Admit: 2017-01-31 | Discharge: 2017-01-31 | Disposition: A | Discharge status: Home / Self Care | Payer: Medicare Other | Source: Ambulatory Visit | Attending: Radiation Oncology | Admitting: Radiation Oncology

## 2017-01-31 DIAGNOSIS — Z510 Encounter for antineoplastic radiation therapy: Secondary | ICD-10-CM | POA: Diagnosis not present

## 2017-02-01 ENCOUNTER — Ambulatory Visit
Admission: RE | Admit: 2017-02-01 | Discharge: 2017-02-01 | Disposition: A | Discharge status: Home / Self Care | Payer: Medicare Other | Source: Ambulatory Visit | Attending: Radiation Oncology | Admitting: Radiation Oncology

## 2017-02-01 DIAGNOSIS — Z510 Encounter for antineoplastic radiation therapy: Secondary | ICD-10-CM | POA: Diagnosis not present

## 2017-02-02 ENCOUNTER — Ambulatory Visit
Admission: RE | Admit: 2017-02-02 | Discharge: 2017-02-02 | Disposition: A | Discharge status: Home / Self Care | Payer: Medicare Other | Source: Ambulatory Visit | Attending: Radiation Oncology | Admitting: Radiation Oncology

## 2017-02-02 DIAGNOSIS — Z510 Encounter for antineoplastic radiation therapy: Secondary | ICD-10-CM | POA: Diagnosis not present

## 2017-02-05 ENCOUNTER — Ambulatory Visit
Admission: RE | Admit: 2017-02-05 | Discharge: 2017-02-05 | Disposition: A | Discharge status: Home / Self Care | Payer: Medicare Other | Source: Ambulatory Visit | Attending: Radiation Oncology | Admitting: Radiation Oncology

## 2017-02-05 ENCOUNTER — Ambulatory Visit: Payer: Medicare Other

## 2017-02-05 DIAGNOSIS — Z510 Encounter for antineoplastic radiation therapy: Secondary | ICD-10-CM | POA: Diagnosis not present

## 2017-02-06 ENCOUNTER — Ambulatory Visit
Admission: RE | Admit: 2017-02-06 | Discharge: 2017-02-06 | Disposition: A | Discharge status: Home / Self Care | Payer: Medicare Other | Source: Ambulatory Visit | Attending: Radiation Oncology | Admitting: Radiation Oncology

## 2017-02-06 ENCOUNTER — Ambulatory Visit: Payer: Medicare Other

## 2017-02-06 DIAGNOSIS — Z510 Encounter for antineoplastic radiation therapy: Secondary | ICD-10-CM | POA: Diagnosis not present

## 2017-02-07 ENCOUNTER — Ambulatory Visit: Payer: Medicare Other

## 2017-02-07 ENCOUNTER — Ambulatory Visit
Admission: RE | Admit: 2017-02-07 | Discharge: 2017-02-07 | Disposition: A | Discharge status: Home / Self Care | Payer: Medicare Other | Source: Ambulatory Visit | Attending: Radiation Oncology | Admitting: Radiation Oncology

## 2017-02-07 DIAGNOSIS — Z510 Encounter for antineoplastic radiation therapy: Secondary | ICD-10-CM | POA: Diagnosis not present

## 2017-02-08 ENCOUNTER — Ambulatory Visit
Admission: RE | Admit: 2017-02-08 | Discharge: 2017-02-08 | Disposition: A | Discharge status: Home / Self Care | Payer: Medicare Other | Source: Ambulatory Visit | Attending: Radiation Oncology | Admitting: Radiation Oncology

## 2017-02-08 ENCOUNTER — Ambulatory Visit: Payer: Medicare Other

## 2017-02-08 DIAGNOSIS — Z510 Encounter for antineoplastic radiation therapy: Secondary | ICD-10-CM | POA: Diagnosis not present

## 2017-02-09 ENCOUNTER — Ambulatory Visit
Admission: RE | Admit: 2017-02-09 | Discharge: 2017-02-09 | Disposition: A | Discharge status: Home / Self Care | Payer: Medicare Other | Source: Ambulatory Visit | Attending: Radiation Oncology | Admitting: Radiation Oncology

## 2017-02-09 ENCOUNTER — Ambulatory Visit: Payer: Medicare Other

## 2017-02-09 DIAGNOSIS — Z510 Encounter for antineoplastic radiation therapy: Secondary | ICD-10-CM | POA: Diagnosis not present

## 2017-02-12 ENCOUNTER — Ambulatory Visit
Admission: RE | Admit: 2017-02-12 | Discharge: 2017-02-12 | Disposition: A | Discharge status: Home / Self Care | Payer: Medicare Other | Source: Ambulatory Visit | Attending: Radiation Oncology | Admitting: Radiation Oncology

## 2017-02-12 ENCOUNTER — Ambulatory Visit: Payer: Medicare Other

## 2017-02-12 DIAGNOSIS — Z510 Encounter for antineoplastic radiation therapy: Secondary | ICD-10-CM | POA: Diagnosis not present

## 2017-02-13 ENCOUNTER — Ambulatory Visit: Payer: Medicare Other

## 2017-02-13 ENCOUNTER — Ambulatory Visit
Admission: RE | Admit: 2017-02-13 | Discharge: 2017-02-13 | Disposition: A | Discharge status: Home / Self Care | Payer: Medicare Other | Source: Ambulatory Visit | Attending: Radiation Oncology | Admitting: Radiation Oncology

## 2017-02-13 DIAGNOSIS — Z510 Encounter for antineoplastic radiation therapy: Secondary | ICD-10-CM | POA: Diagnosis not present

## 2017-02-14 ENCOUNTER — Ambulatory Visit: Payer: Medicare Other

## 2017-02-14 ENCOUNTER — Ambulatory Visit
Admission: RE | Admit: 2017-02-14 | Discharge: 2017-02-14 | Disposition: A | Discharge status: Home / Self Care | Payer: Medicare Other | Source: Ambulatory Visit | Attending: Radiation Oncology | Admitting: Radiation Oncology

## 2017-02-14 DIAGNOSIS — Z510 Encounter for antineoplastic radiation therapy: Secondary | ICD-10-CM | POA: Diagnosis not present

## 2017-02-15 ENCOUNTER — Ambulatory Visit: Payer: Medicare Other

## 2017-02-15 ENCOUNTER — Ambulatory Visit
Admission: RE | Admit: 2017-02-15 | Discharge: 2017-02-15 | Disposition: A | Discharge status: Home / Self Care | Payer: Medicare Other | Source: Ambulatory Visit | Attending: Radiation Oncology | Admitting: Radiation Oncology

## 2017-02-15 DIAGNOSIS — Z510 Encounter for antineoplastic radiation therapy: Secondary | ICD-10-CM | POA: Diagnosis not present

## 2017-02-16 ENCOUNTER — Ambulatory Visit: Payer: Medicare Other

## 2017-02-16 ENCOUNTER — Ambulatory Visit
Admission: RE | Admit: 2017-02-16 | Discharge: 2017-02-16 | Disposition: A | Discharge status: Home / Self Care | Payer: Medicare Other | Source: Ambulatory Visit | Attending: Radiation Oncology | Admitting: Radiation Oncology

## 2017-02-16 DIAGNOSIS — Z510 Encounter for antineoplastic radiation therapy: Secondary | ICD-10-CM | POA: Diagnosis not present

## 2017-02-19 ENCOUNTER — Ambulatory Visit
Admission: RE | Admit: 2017-02-19 | Discharge: 2017-02-19 | Disposition: A | Discharge status: Home / Self Care | Payer: Medicare Other | Source: Ambulatory Visit | Attending: Radiation Oncology | Admitting: Radiation Oncology

## 2017-02-19 ENCOUNTER — Ambulatory Visit: Payer: Medicare Other

## 2017-02-19 DIAGNOSIS — Z510 Encounter for antineoplastic radiation therapy: Secondary | ICD-10-CM | POA: Diagnosis not present

## 2017-02-20 ENCOUNTER — Ambulatory Visit: Payer: Medicare Other

## 2017-02-20 ENCOUNTER — Ambulatory Visit
Admission: RE | Admit: 2017-02-20 | Discharge: 2017-02-20 | Disposition: A | Discharge status: Home / Self Care | Payer: Medicare Other | Source: Ambulatory Visit | Attending: Radiation Oncology | Admitting: Radiation Oncology

## 2017-02-20 DIAGNOSIS — Z510 Encounter for antineoplastic radiation therapy: Secondary | ICD-10-CM | POA: Diagnosis not present

## 2017-02-21 ENCOUNTER — Ambulatory Visit
Admission: RE | Admit: 2017-02-21 | Discharge: 2017-02-21 | Disposition: A | Discharge status: Home / Self Care | Payer: Medicare Other | Source: Ambulatory Visit | Attending: Radiation Oncology | Admitting: Radiation Oncology

## 2017-02-21 ENCOUNTER — Ambulatory Visit: Payer: Medicare Other

## 2017-02-21 DIAGNOSIS — Z510 Encounter for antineoplastic radiation therapy: Secondary | ICD-10-CM | POA: Diagnosis not present

## 2017-02-22 ENCOUNTER — Ambulatory Visit
Admission: RE | Admit: 2017-02-22 | Discharge: 2017-02-22 | Disposition: A | Discharge status: Home / Self Care | Payer: Medicare Other | Source: Ambulatory Visit | Attending: Radiation Oncology | Admitting: Radiation Oncology

## 2017-02-22 ENCOUNTER — Ambulatory Visit: Payer: Medicare Other

## 2017-02-22 DIAGNOSIS — Z510 Encounter for antineoplastic radiation therapy: Secondary | ICD-10-CM | POA: Diagnosis not present

## 2017-02-23 ENCOUNTER — Ambulatory Visit
Admission: RE | Admit: 2017-02-23 | Discharge: 2017-02-23 | Disposition: A | Discharge status: Home / Self Care | Payer: Medicare Other | Source: Ambulatory Visit | Attending: Radiation Oncology | Admitting: Radiation Oncology

## 2017-02-23 ENCOUNTER — Ambulatory Visit: Payer: Medicare Other

## 2017-02-23 DIAGNOSIS — Z510 Encounter for antineoplastic radiation therapy: Secondary | ICD-10-CM | POA: Diagnosis not present

## 2017-02-26 ENCOUNTER — Ambulatory Visit
Admission: RE | Admit: 2017-02-26 | Discharge: 2017-02-26 | Disposition: A | Discharge status: Home / Self Care | Payer: Medicare Other | Source: Ambulatory Visit | Attending: Radiation Oncology | Admitting: Radiation Oncology

## 2017-02-26 ENCOUNTER — Ambulatory Visit: Payer: Medicare Other

## 2017-02-26 DIAGNOSIS — Z510 Encounter for antineoplastic radiation therapy: Secondary | ICD-10-CM | POA: Diagnosis not present

## 2017-02-27 ENCOUNTER — Ambulatory Visit
Admission: RE | Admit: 2017-02-27 | Discharge: 2017-02-27 | Disposition: A | Discharge status: Home / Self Care | Payer: Medicare Other | Source: Ambulatory Visit | Attending: Radiation Oncology | Admitting: Radiation Oncology

## 2017-02-27 ENCOUNTER — Ambulatory Visit: Payer: Medicare Other

## 2017-02-27 DIAGNOSIS — Z510 Encounter for antineoplastic radiation therapy: Secondary | ICD-10-CM | POA: Diagnosis not present

## 2017-02-28 ENCOUNTER — Ambulatory Visit
Admission: RE | Admit: 2017-02-28 | Discharge: 2017-02-28 | Disposition: A | Discharge status: Home / Self Care | Payer: Medicare Other | Source: Ambulatory Visit | Attending: Radiation Oncology | Admitting: Radiation Oncology

## 2017-02-28 ENCOUNTER — Ambulatory Visit: Payer: Medicare Other

## 2017-02-28 DIAGNOSIS — Z510 Encounter for antineoplastic radiation therapy: Secondary | ICD-10-CM | POA: Diagnosis not present

## 2017-03-01 ENCOUNTER — Ambulatory Visit
Admission: RE | Admit: 2017-03-01 | Discharge: 2017-03-01 | Disposition: A | Discharge status: Home / Self Care | Payer: Medicare Other | Source: Ambulatory Visit | Attending: Radiation Oncology | Admitting: Radiation Oncology

## 2017-03-01 ENCOUNTER — Ambulatory Visit: Payer: Medicare Other

## 2017-03-01 DIAGNOSIS — Z510 Encounter for antineoplastic radiation therapy: Secondary | ICD-10-CM | POA: Diagnosis not present

## 2017-03-02 ENCOUNTER — Ambulatory Visit
Admission: RE | Admit: 2017-03-02 | Discharge: 2017-03-02 | Disposition: A | Discharge status: Home / Self Care | Payer: Medicare Other | Source: Ambulatory Visit | Attending: Radiation Oncology | Admitting: Radiation Oncology

## 2017-03-02 ENCOUNTER — Encounter: Payer: Self-pay | Admitting: Medical Oncology

## 2017-03-02 ENCOUNTER — Ambulatory Visit: Payer: Medicare Other

## 2017-03-02 VITALS — BP 114/76 | HR 71 | Resp 18 | Wt 234.0 lb

## 2017-03-02 DIAGNOSIS — C61 Malignant neoplasm of prostate: Secondary | ICD-10-CM

## 2017-03-02 DIAGNOSIS — Z510 Encounter for antineoplastic radiation therapy: Secondary | ICD-10-CM | POA: Diagnosis not present

## 2017-03-02 NOTE — Progress Notes (Signed)
  Radiation Oncology         [phone]) [phone] ________________________________  Name: Bryan Rivers MRN: [id number]  Date: 03/02/2017  DOB: 1941/07/27    Weekly Radiation Therapy Management    ICD-9-CM ICD-10-CM   1. Malignant neoplasm of prostate ([place]) 185 C61      Current Dose: 72.15 Gy     Planned Dose:  78 Gy  Narrative . . . . . . . . The patient presents for routine under treatment assessment.                                Weight and vitals stable. Denies pain. Reports nocturia x 1-2. Reports increased nocturia one night this week due to increased fluid intake. Denies dysuria or hematuria. Denies leakage or incontinence. Describes a strong steady urine stream without difficulty emptying his bladder. Denies diarrhea. Denies fatigue. He is excited to go play golf after leaving our visit.                                  Set-up films were reviewed.                                 The chart was checked. Physical Findings. . .  weight is 234 lb (106.1 kg). His blood pressure is 114/76 and his pulse is 71. His respiration is 18 and oxygen saturation is 100%. . Weight essentially stable.  No significant changes. Lungs are clear to auscultation bilaterally. Heart has regular rate and rhythm. Abdomen soft, non-tender, normal bowel sounds. Impression . . . . . . . The patient is tolerating radiation. Plan . . . . . . . . . . . . Continue treatment as planned.  ________________________________   Blair Promise, PhD, MD  This document serves as a record of services personally performed by Gery Pray, MD. It was created on his behalf by Arlyce Harman, a trained medical scribe. The creation of this record is based on the scribe's personal observations and the provider's statements to them. This document has been checked and approved by the attending provider.

## 2017-03-02 NOTE — Progress Notes (Signed)
Mr. Bryan Rivers states he is doing great. No side effects except yesterday he drank a lot of water and had to get up about 5 times but this is out of the norm. He will complete radiation 03/07/17.

## 2017-03-02 NOTE — Progress Notes (Signed)
Weight and vitals stable. Denies pain. Reports nocturia x 1-2. Report increased nocturia one night this week due to increased fluid intake. Denies dysuria or hematuria. Denies leakage or incontinence. Describes a strong steady urine stream without difficulty emptying his bladder. Denies diarrhea. Denies fatigue.  BP 114/76 (BP Location: Right Arm, Patient Position: Sitting, Cuff Size: Large)   Pulse 71   Resp 18   Wt 234 lb (106.1 kg)   SpO2 100%   BMI 31.74 kg/m  Wt Readings from Last 3 Encounters:  03/02/17 234 lb (106.1 kg)  01/25/17 231 lb (104.8 kg)  01/19/17 231 lb 9.6 oz (105.1 kg)

## 2017-03-05 ENCOUNTER — Ambulatory Visit: Payer: Medicare Other

## 2017-03-05 ENCOUNTER — Ambulatory Visit
Admission: RE | Admit: 2017-03-05 | Discharge: 2017-03-05 | Disposition: A | Discharge status: Home / Self Care | Payer: Medicare Other | Source: Ambulatory Visit | Attending: Radiation Oncology | Admitting: Radiation Oncology

## 2017-03-05 DIAGNOSIS — Z510 Encounter for antineoplastic radiation therapy: Secondary | ICD-10-CM | POA: Diagnosis not present

## 2017-03-06 ENCOUNTER — Ambulatory Visit: Payer: Medicare Other

## 2017-03-06 ENCOUNTER — Ambulatory Visit
Admission: RE | Admit: 2017-03-06 | Discharge: 2017-03-06 | Disposition: A | Discharge status: Home / Self Care | Payer: Medicare Other | Source: Ambulatory Visit | Attending: Radiation Oncology | Admitting: Radiation Oncology

## 2017-03-06 DIAGNOSIS — Z510 Encounter for antineoplastic radiation therapy: Secondary | ICD-10-CM | POA: Diagnosis not present

## 2017-03-07 ENCOUNTER — Encounter: Payer: Self-pay | Admitting: Radiation Oncology

## 2017-03-07 ENCOUNTER — Ambulatory Visit: Payer: Medicare Other

## 2017-03-07 ENCOUNTER — Ambulatory Visit
Admission: RE | Admit: 2017-03-07 | Discharge: 2017-03-07 | Disposition: A | Discharge status: Home / Self Care | Payer: Medicare Other | Source: Ambulatory Visit | Attending: Radiation Oncology | Admitting: Radiation Oncology

## 2017-03-07 DIAGNOSIS — Z510 Encounter for antineoplastic radiation therapy: Secondary | ICD-10-CM | POA: Diagnosis not present

## 2017-03-07 NOTE — Progress Notes (Signed)
  Radiation Oncology         [phone]) [phone] ________________________________  Name: Bryan Rivers MRN: [id number]  Date: 03/07/2017  DOB: Dec 27, 1940  End of Treatment Note  Diagnosis:   76 y.o.gentleman with stage T1cadenocarcinoma of the prostate with a Gleason's score of 3+4and a PSA of 10.52     Indication for treatment:  Curative, Definitive Radiotherapy       Radiation treatment dates:   01/09/2017 to 03/07/2017  Site/dose:   The prostate was treated to 78 Gy in 40 fractions of 1.95 Gy  Beams/energy:   The patient was treated with IMRT using volumetric arc therapy delivering 6 MV X-rays to clockwise and counterclockwise circumferential arcs with a 90 degree collimator offset to avoid dose scalloping.  Image guidance was performed with daily cone beam CT prior to each fraction to align to gold markers in the prostate and assure proper bladder and rectal fill volumes.  Immobilization was achieved with BodyFix custom mold.  Narrative: The patient tolerated radiation treatment relatively well.   The patient experienced some minor urinary irritation including, nocturia x 1-2.  He denies fatigue, dysuria, hematuria or diarrhea. He continued to play golf regularly during his treatment course.  Plan: The patient has completed radiation treatment. He will return to radiation oncology clinic for routine followup in one month. I advised him to call or return sooner if he has any questions or concerns related to his recovery or treatment. ________________________________  Sheral Apley. Tammi Klippel, M.D.  This document serves as a record of services personally performed by Tyler Pita, MD. It was created on his behalf by Arlyce Harman, a trained medical scribe. The creation of this record is based on the scribe's personal observations and the provider's statements to them. This document has been checked and approved by the attending provider.

## 2017-03-07 NOTE — Progress Notes (Signed)
Received patient and his wife in the clinic today following final radiation treatment. Patient reports manageable continued urinary frequency. Denies dysuria or hematuria. Denies diarrhea. Denies fatigue. Provided patient with one month follow up appointment card. Encouraged patient to contact this RN with future needs. Patient verbalized understanding of all reviewed. Patient expressed great appreciation for the care he received.

## 2017-03-08 ENCOUNTER — Ambulatory Visit: Payer: Medicare Other

## 2017-03-09 ENCOUNTER — Ambulatory Visit: Payer: Medicare Other

## 2017-03-12 ENCOUNTER — Ambulatory Visit: Payer: Medicare Other

## 2017-03-13 ENCOUNTER — Ambulatory Visit: Payer: Medicare Other

## 2017-03-14 ENCOUNTER — Ambulatory Visit: Payer: Medicare Other

## 2017-03-15 ENCOUNTER — Ambulatory Visit: Payer: Medicare Other

## 2017-03-16 ENCOUNTER — Ambulatory Visit: Payer: Medicare Other

## 2017-03-19 ENCOUNTER — Ambulatory Visit: Payer: Medicare Other

## 2017-03-20 ENCOUNTER — Ambulatory Visit: Payer: Medicare Other

## 2017-03-21 ENCOUNTER — Ambulatory Visit: Payer: Medicare Other

## 2017-03-22 ENCOUNTER — Ambulatory Visit: Payer: Medicare Other

## 2017-04-12 ENCOUNTER — Inpatient Hospital Stay: Admission: RE | Admit: 2017-04-12 | Payer: Self-pay | Source: Ambulatory Visit | Admitting: Urology

## 2017-04-24 NOTE — Progress Notes (Signed)
Radiation Oncology         [phone] ________________________________  Name: Bryan Rivers MRN: [id number]  Date: 04/25/2017  DOB: [birth year]  Post Treatment Note  CC: Beckie Salts, MD  Marcene Corning C*  Diagnosis:   76 y.o.gentleman with stage T1cadenocarcinoma of the prostate with a Gleason's score of 3+4and a PSA of 10.52     Interval Since Last Radiation: 7 weeks   01/09/2017 to 03/07/2017:  The prostate was treated to 78 Gy in 40 fractions of 1.95 Gy  Narrative:  The patient returns today for routine follow-up.  The patient experienced some minor urinary irritation including, nocturia x 1-2.  He denied fatigue, dysuria, hematuria or diarrhea. He continued to play golf regularly during his treatment course.                              On review of systems, the patient states that he is doing very well and is currently without complaints. He has resumed playing multiple rounds of golf and working out daily. He denies any significant urinary symptoms at this point. He feels that he is emptying his bladder well and denies gross hematuria. He specifically denies dysuria, excessive frequency, urgency or urinary leakage. He reports that his bowels have returned to normal and he denies abdominal pain, nausea, vomiting or diarrhea. He reports a healthy appetite and is maintaining his weight.  ALLERGIES:  has No Known Allergies.  Meds: Current Outpatient Prescriptions  Medication Sig Dispense Refill  . amLODipine (NORVASC) 5 MG tablet     . atorvastatin (LIPITOR) 40 MG tablet     . losartan-hydrochlorothiazide (HYZAAR) 100-25 MG per tablet TAKE 1 TABLET BY MOUTH EVERY DAY 30 tablet 0   No current facility-administered medications for this encounter.     Physical Findings:  height is 6" (0.152 m) (abnormal) and weight is 234 lb (106.1 kg). His oral temperature is 98.4 F (36.9 C). His blood pressure is 168/82 (abnormal) and his pulse is 88. His respiration is 18 and oxygen  saturation is 97%.  Pain Assessment Pain Score: 0-No pain/10 In general this is a well appearing Caucasian male in no acute distress. He's alert and oriented x4 and appropriate throughout the examination. Cardiopulmonary assessment is negative for acute distress and he exhibits normal effort.   Lab Findings: Lab Results  Component Value Date   WBC 5.1 10/31/2012   HGB 15.9 10/31/2012   HCT 45.6 10/31/2012   MCV 84.0 10/31/2012   PLT 190 10/31/2012     Radiographic Findings: No results found.  Impression/Plan: 1. Stage T1c adenocarcinoma of the prostate with a Gleason Score of 3+4, and a PSA of 10.52. He will continue to follow up with urology for ongoing PSA determinations and does not currently have an appointment scheduled with Dr. Alinda Money for follow-up. I attempted to call [facility] today while the patient was in the office to arrange his follow-up appointment but was told that Dr. Alinda Money schedule was overbooked at present and his nurse will have to get back to the patient with an appointment time and date once she speaks with Dr. Alinda Money. I have informed the patient that he should here from Dr. Lynne Logan office within the next 1-3 days and if not, he should call [facility] to follow up. He understands what to expect with regards to PSA monitoring going forward. I will look forward to following his response via correspondence with urology, and would  be happy to continue to participate in his care if clinically indicated. I talked to the patient about what to expect in the future, including his risk for erectile dysfunction rectal bleeding. I encouraged him to call or return to the office if he has any questions regarding his previous radiation or possible radiation side effects. He was comfortable with this plan and will follow up as needed.    Nicholos Johns, PA-C

## 2017-04-25 ENCOUNTER — Encounter: Payer: Self-pay | Admitting: Urology

## 2017-04-25 ENCOUNTER — Ambulatory Visit
Admission: RE | Admit: 2017-04-25 | Discharge: 2017-04-25 | Disposition: A | Discharge status: Home / Self Care | Payer: Medicare Other | Source: Ambulatory Visit | Attending: Urology | Admitting: Urology

## 2017-04-25 VITALS — BP 168/82 | HR 88 | Temp 98.4°F | Resp 18 | Ht <= 58 in | Wt 234.0 lb

## 2017-04-25 DIAGNOSIS — C61 Malignant neoplasm of prostate: Secondary | ICD-10-CM | POA: Diagnosis not present

## 2017-04-25 DIAGNOSIS — Z79899 Other long term (current) drug therapy: Secondary | ICD-10-CM | POA: Insufficient documentation

## 2017-04-25 DIAGNOSIS — Z923 Personal history of irradiation: Secondary | ICD-10-CM | POA: Insufficient documentation

## 2017-05-14 NOTE — Addendum Note (Signed)
Encounter addended by: Heywood Footman, RN on: 05/14/2017  3:23 PM<BR>    Actions taken: Visit Navigator Flowsheet section accepted

## 2018-11-26 ENCOUNTER — Ambulatory Visit (INDEPENDENT_AMBULATORY_CARE_PROVIDER_SITE_OTHER): Payer: Medicare Other | Admitting: Family Medicine

## 2018-11-26 ENCOUNTER — Encounter: Payer: Self-pay | Admitting: Family Medicine

## 2018-11-26 VITALS — BP 146/78 | HR 67 | Ht 70.0 in | Wt 225.0 lb

## 2018-11-26 DIAGNOSIS — M545 Low back pain: Secondary | ICD-10-CM

## 2018-11-26 DIAGNOSIS — M25512 Pain in left shoulder: Secondary | ICD-10-CM | POA: Diagnosis not present

## 2018-11-26 DIAGNOSIS — M25552 Pain in left hip: Secondary | ICD-10-CM | POA: Diagnosis not present

## 2018-11-26 DIAGNOSIS — G8929 Other chronic pain: Secondary | ICD-10-CM

## 2018-11-26 NOTE — Progress Notes (Signed)
PCP: Beckie Salts, MD  Subjective:   HPI: Patient is a 77 y.o. male here for back, left shoulder, hip pain.  Patient is an avid golfer and reports he works out twice a week, plays golf 3 times a week. He states in doing so toward the end of his 18 holes he will get pain primarily in the anterior, lateral aspect of the left shoulder, lower portion of the low back, and into the left groin. No numbness or tingling. No bowel or bladder dysfunction. Pain is more of an ache and is currently a 0 out of 10. He occasionally will take Aleve which helps tremendously.  Past Medical History:  Diagnosis Date  . Hypertension   . Morbid obesity ([place]) 02/11/2013  . Prostate cancer [facility])     Current Outpatient Medications on File Prior to Visit  Medication Sig Dispense Refill  . hydrochlorothiazide (HYDRODIURIL) 25 MG tablet Take by mouth.    Marland Kitchen lisinopril (PRINIVIL,ZESTRIL) 40 MG tablet Take by mouth.    . metoprolol succinate (TOPROL-XL) 100 MG 24 hr tablet TAKE 1 TABLET BY MOUTH  DAILY    . amLODipine (NORVASC) 5 MG tablet     . atorvastatin (LIPITOR) 40 MG tablet      No current facility-administered medications on file prior to visit.     Past Surgical History:  Procedure Laterality Date  . APPENDECTOMY  1950   age 35    No Known Allergies  Social History   Socioeconomic History  . Marital status: Married    Spouse name: Not on file  . Number of children: Not on file  . Years of education: Not on file  . Highest education level: Not on file  Occupational History  . Not on file  Social Needs  . Financial resource strain: Not on file  . Food insecurity:    Worry: Not on file    Inability: Not on file  . Transportation needs:    Medical: Not on file    Non-medical: Not on file  Tobacco Use  . Smoking status: Never Smoker  . Smokeless tobacco: Never Used  Substance and Sexual Activity  . Alcohol use: No    Alcohol/week: 0.0 standard drinks  . Drug use: No  . Sexual  activity: Not on file  Lifestyle  . Physical activity:    Days per week: Not on file    Minutes per session: Not on file  . Stress: Not on file  Relationships  . Social connections:    Talks on phone: Not on file    Gets together: Not on file    Attends religious service: Not on file    Active member of club or organization: Not on file    Attends meetings of clubs or organizations: Not on file    Relationship status: Not on file  . Intimate partner violence:    Fear of current or ex partner: Not on file    Emotionally abused: Not on file    Physically abused: Not on file    Forced sexual activity: Not on file  Other Topics Concern  . Not on file  Social History Narrative   Married 20 years   1 daughter age 54   Retired    Family History  Problem Relation Age of Onset  . Heart disease Mother   . Diabetes Mother     BP (!) 146/78   Pulse 67   Ht 5\' 10"  (1.778 m)   Wt 225 lb (  102.1 kg)   BMI 32.28 kg/m   Review of Systems: See HPI above.     Objective:  Physical Exam:  Gen: NAD, comfortable in exam room  Back: No gross deformity, scoliosis. No paraspinal TTP .  No midline or bony TTP. FROM without pain. Strength LEs 5/5 all muscle groups.   2+ MSRs in patellar and achilles tendons, equal bilaterally. Negative SLRs. Sensation intact to light touch bilaterally.  Left hip: No deformity. Mod limitation IR.  5/5 strength all motions. No tenderness to palpation. NVI distally. Mild pain logroll. Negative fabers and piriformis stretches.  Left shoulder: No swelling, ecchymoses.  No gross deformity. No TTP. FROM except lacks 5 degrees abduction and flexion. Negative Hawkins, Neers. Negative Yergasons. Strength 5/5 with empty can and resisted internal/external rotation. NV intact distally.  Assessment & Plan:  1.  Low back pain: Exam is reassuring.  History, exam consistent with degenerative disc disease possibly facet arthritis.  We discussed Tylenol,  topical medications, supplements that may help, Aleve.  Will consider physical therapy.  Home exercises.  Heat or ice.  Follow-up as needed.  2.  Left hip pain - secondary to arthritis.  We discussed Tylenol, topical medications, supplements may help, Aleve.  Consider physical therapy.  Home exercises reviewed.  Heat or ice.  Follow-up as needed.  3.  Left shoulder pain - We discussed Tylenol, topical medications, supplements may help, Aleve.  Consider physical therapy.  Home exercises reviewed.  Heat or ice.  Follow-up as needed.

## 2018-11-26 NOTE — Patient Instructions (Signed)
Your pain is due to arthritis. These are the different medications you can take for this: Tylenol 500mg  1-2 tabs three times a day for pain - use this before you go golfing. Aspercreme or biofreeze topically up to four times a day may also help with pain. Some supplements that may help for arthritis: Boswellia extract, curcumin, pycnogenol Aleve 1-2 tabs twice a day with food Cortisone injections are an option if a particular joint has severe pain. It's important that you continue to stay active. Strengthening exercises 3 sets of 10 once a day. Consider physical therapy to strengthen muscles around the joint that hurts to take pressure off of the joint itself. Shoe inserts with good arch support may be helpful. Heat or ice 15 minutes at a time 3-4 times a day as needed to help with pain. Water aerobics and cycling with low resistance are the best two types of exercise for arthritis though any exercise is ok as long as it doesn't worsen the pain. Follow up with me as needed.

## 2018-11-27 ENCOUNTER — Encounter: Payer: Self-pay | Admitting: Family Medicine

## 2018-12-26 ENCOUNTER — Ambulatory Visit (HOSPITAL_BASED_OUTPATIENT_CLINIC_OR_DEPARTMENT_OTHER)
Admission: RE | Admit: 2018-12-26 | Discharge: 2018-12-26 | Disposition: A | Discharge status: Home / Self Care | Payer: Medicare Other | Source: Ambulatory Visit | Attending: Family Medicine | Admitting: Family Medicine

## 2018-12-26 ENCOUNTER — Encounter: Payer: Self-pay | Admitting: Family Medicine

## 2018-12-26 ENCOUNTER — Ambulatory Visit (INDEPENDENT_AMBULATORY_CARE_PROVIDER_SITE_OTHER): Payer: Medicare Other | Admitting: Family Medicine

## 2018-12-26 VITALS — BP 132/80 | HR 62 | Temp 98.3°F | Ht 70.0 in | Wt 231.1 lb

## 2018-12-26 DIAGNOSIS — M25562 Pain in left knee: Secondary | ICD-10-CM | POA: Insufficient documentation

## 2018-12-26 DIAGNOSIS — G8929 Other chronic pain: Secondary | ICD-10-CM | POA: Insufficient documentation

## 2018-12-26 DIAGNOSIS — R1032 Left lower quadrant pain: Secondary | ICD-10-CM

## 2018-12-26 DIAGNOSIS — M25512 Pain in left shoulder: Secondary | ICD-10-CM

## 2018-12-26 DIAGNOSIS — I10 Essential (primary) hypertension: Secondary | ICD-10-CM

## 2018-12-26 DIAGNOSIS — Z23 Encounter for immunization: Secondary | ICD-10-CM | POA: Diagnosis not present

## 2018-12-26 MED ORDER — AMLODIPINE-VALSARTAN-HCTZ 10-320-25 MG PO TABS: 1.0000 | ORAL_TABLET | Freq: Every day | ORAL | 3 refills | Status: DC

## 2018-12-26 NOTE — Patient Instructions (Addendum)
We will be in touch regarding the X-ray.  OK to take Tylenol 1000 mg (2 extra strength tabs) or 975 mg (3 regular strength tabs) every 6 hours as needed.  Heat (pad or rice pillow in microwave) over affected area, 10-15 minutes twice daily.   Ice/cold pack over area for 10-15 min twice daily.  OK to use Debrox (peroxide) in the ear to loosen up wax. Also recommend using a bulb syringe (for removing boogers from baby's noses) to flush through warm water. Do not use Q-tips as this can impact wax further.  Let us know if you need anything.  Stop the atorvastatin until we see each other next.  Hip Exercises It is normal to feel mild stretching, pulling, tightness, or discomfort as you do these exercises, but you should stop right away if you feel sudden pain or your pain gets worse.  STRETCHING AND RANGE OF MOTION EXERCISES These exercises warm up your muscles and joints and improve the movement and flexibility of your hip. These exercises also help to relieve pain, numbness, and tingling. Exercise A: Hamstrings, Supine  1. Lie on your back. 2. Loop a belt or towel over the ball of your left / rightfoot. The ball of your foot is on the walking surface, right under your toes. 3. Straighten your left / rightknee and slowly pull on the belt to raise your leg. ? Do not let your left / right knee bend while you do this. ? Keep your other leg flat on the floor. ? Raise the left / right leg until you feel a gentle stretch behind your left / right knee or thigh. 4. Hold this position for 30 seconds. 5. Slowly return your leg to the starting position. Repeat2 times. Complete this stretch 3 times per week. Exercise B: Hip Rotators  1. Lie on your back on a firm surface. 2. Hold your left / right knee with your left / right hand. Hold your ankle with your other hand. 3. Gently pull your left / right knee and rotate your lower leg toward your other shoulder. ? Pull until you feel a stretch in  your buttocks. ? Keep your hips and shoulders firmly planted while you do this stretch. 4. Hold this position for 30 seconds. Repeat 2 times. Complete this stretch 3 times per week. Exercise C: V-Sit (Hamstrings and Adductors)  1. Sit on the floor with your legs extended in a large "V" shape. Keep your knees straight during this exercise. 2. Start with your head and chest upright, then bend at your waist to reach for your left foot (position A). You should feel a stretch in your right inner thigh. 3. Hold this position for 30 seconds. Then slowly return to the upright position. 4. Bend at your waist to reach forward (position B). You should feel a stretch behind both of your thighs and knees. 5. Hold this position for 30 seconds. Then slowly return to the upright position. 6. Bend at your waist to reach for your right foot (position C). You should feel a stretch in your left inner thigh. 7. Hold this position for 30 seconds. Then slowly return to the upright position. Repeat A, B, and C 2 times each. Complete this stretch 3 times per week. Exercise D: Lunge (Hip Flexors)  1. Place your left / right knee on the floor and bend your other knee so that is directly over your ankle. You should be half-kneeling. 2. Keep good posture with your head over  your shoulders. 3. Tighten your buttocks to point your tailbone downward. This helps your back to keep from arching too much. 4. You should feel a gentle stretch in the front of your left / right thigh and hip. If you do not feel any resistance, slightly slide your other foot forward and then slowly lunge forward so your knee once again lines up over your ankle. 5. Make sure your tailbone continues to point downward. 6. Hold this position for 30 seconds. Repeat 2 times. Complete this stretch 3 times per week.  STRENGTHENING EXERCISES These exercises build strength and endurance in your hip. Endurance is the ability to use your muscles for a long  time, even after they get tired. Exercise E: Bridge (Hip Extensors)  1. Lie on your back on a firm surface with your knees bent and your feet flat on the floor. 2. Tighten your buttocks muscles and lift your bottom off the floor until the trunk of your body is level with your thighs. ? Do not arch your back. ? You should feel the muscles working in your buttocks and the back of your thighs. If you do not feel these muscles, slide your feet 1-2 inches (2.5-5 cm) farther away from your buttocks. 3. Hold this position for 3 seconds. 4. Slowly lower your hips to the starting position. Repeat for a total of 10 repetitions. 5. Let your muscles relax completely between repetitions. 6. If this exercise is too easy, try doing it with your arms crossed over your chest. Repeat 2 times. Complete this exercise 3 times per week. Exercise F: Straight Leg Raises - Hip Abductors  1. Lie on your side with your left / right leg in the top position. Lie so your head, shoulder, knee, and hip line up with each other. You may bend your bottom knee to help you balance. 2. Roll your hips slightly forward, so your hips are stacked directly over each other and your left / right knee is facing forward. 3. Leading with your heel, lift your top leg 4-6 inches (10-15 cm). You should feel the muscles in your outer hip lifting. ? Do not let your foot drift forward. ? Do not let your knee roll toward the ceiling. 4. Hold this position for 1 second. 5. Slowly return to the starting position. 6. Let your muscles relax completely between repetitions. Repeat for a total of 10 repetitions.  Repeat 2 times. Complete this exercise 3 times per week. Exercise G: Straight Leg Raises - Hip Adductors  1. Lie on your side with your left / right leg in the bottom position. Lie so your head, shoulder, knee, and hip line up. You may place your upper foot in front to help you balance. 2. Roll your hips slightly forward, so your hips are  stacked directly over each other and your left / right knee is facing forward. 3. Tense the muscles in your inner thigh and lift your bottom leg 4-6 inches (10-15 cm). 4. Hold this position for 1 second. 5. Slowly return to the starting position. 6. Let your muscles relax completely between repetitions. Repeat for a total of 10 repetitions. Repeat 2 times. Complete this exercise 3 times per week. Exercise H: Straight Leg Raises - Quadriceps  1. Lie on your back with your left / right leg extended and your other knee bent. 2. Tense the muscles in the front of your left / right thigh. When you do this, you should see your kneecap slide up or  see increased dimpling just above your knee. 3. Tighten these muscles even more and raise your leg 4-6 inches (10-15 cm) off the floor. 4. Hold this position for 3 seconds. 5. Keep these muscles tense as you lower your leg. 6. Relax the muscles slowly and completely between repetitions. Repeat for a total of 10 repetitions. Repeat 2 times. Complete this exercise 3 times per week. Exercise I: Hip Abductors, Standing 1. Tie one end of a rubber exercise band or tubing to a secure surface, such as a table or pole. 2. Loop the other end of the band or tubing around your left / right ankle. 3. Keeping your ankle with the band or tubing directly opposite of the secured end, step away until there is tension in the tubing or band. Hold onto a chair as needed for balance. 4. Lift your left / right leg out to your side. While you do this: ? Keep your back upright. ? Keep your shoulders over your hips. ? Keep your toes pointing forward. ? Make sure to use your hip muscles to lift your leg. Do not "throw" your leg or tip your body to lift your leg. 5. Hold this position for 1 second. 6. Slowly return to the starting position. Repeat for a total of 10 repetitions. Repeat 2 times. Complete this exercise 3 times per week. Exercise J: Squats (Quadriceps) 1. Stand in a  door frame so your feet and knees are in line with the frame. You may place your hands on the frame for balance. 2. Slowly bend your knees and lower your hips like you are going to sit in a chair. ? Keep your lower legs in a straight-up-and-down position. ? Do not let your hips go lower than your knees. ? Do not bend your knees lower than told by your health care provider. ? If your hip pain increases, do not bend as low. 3. Hold this position for 1 second. 4. Slowly push with your legs to return to standing. Do not use your hands to pull yourself to standing. Repeat for a total of 10 repetitions. Repeat 2 times. Complete this exercise 3 times per week. Make sure you discuss any questions you have with your health care provider. Document Released: 12/01/2005 Document Revised: 08/07/2016 Document Reviewed: 11/08/2015 Elsevier Interactive Patient Education  [employer].

## 2018-12-26 NOTE — Progress Notes (Signed)
Chief Complaint  Patient presents with  . New Patient (Initial Visit)       New Patient Visit SUBJECTIVE: HPI: Bryan Rivers is an 78 y.o.male who is being seen for establishing care.  Hypertension Patient presents for hypertension follow up. He is compliant with medications-Toprol XL 100 mg/d, lisinopril 40 mg/d, HCTZ 25 mg/d, Norvasc 10 mg/d. Patient has these side effects of medication: none- wishes to take fewer pills He is adhering to a healthy diet overall. Exercise: golf  5-6 mo ago, started having L sided hip, shoulder and knee pain. No inj or change in activity.  Saw sports med and home stretches/exercises did not help. Thought to have L hip OA. Tylenol not particularly helpful. No neurologic s/s's.   No Known Allergies  Past Medical History:  Diagnosis Date  . Hypertension   . Morbid obesity ([place]) 02/11/2013  . Prostate cancer [facility])    Past Surgical History:  Procedure Laterality Date  . APPENDECTOMY  1950   age 5   Family History  Problem Relation Age of Onset  . Heart disease Mother   . Diabetes Mother    No Known Allergies  Current Outpatient Medications:  .  atorvastatin (LIPITOR) 40 MG tablet, Take 40 mg by mouth daily., Disp: , Rfl:  .  metoprolol succinate (TOPROL-XL) 100 MG 24 hr tablet, TAKE 1 TABLET BY MOUTH  DAILY, Disp: , Rfl:  .  amLODIPine-Valsartan-HCTZ 10-320-25 MG TABS, Take 1 tablet by mouth daily., Disp: 30 tablet, Rfl: 3  ROS Cardiovascular: Denies chest pain  Respiratory: Denies dyspnea   OBJECTIVE: BP 132/80 (BP Location: Left Arm, Patient Position: Sitting, Cuff Size: Large)   Pulse 62   Temp 98.3 F (36.8 C) (Oral)   Ht 5\' 10"  (1.778 m)   Wt 231 lb 2 oz (104.8 kg)   SpO2 96%   BMI 33.16 kg/m   Constitutional: -  VS reviewed -  Well developed, well nourished, appears stated age -  No apparent distress  Psychiatric: -  Oriented to person, place, and time -  Memory intact -  Affect and mood normal -  Fluent conversation,  good eye contact -  Judgment and insight age appropriate  Eye: -  Conjunctivae clear, no discharge -  Pupils symmetric, round, reactive to light  ENMT: -  MMM    Pharynx moist, no exudate, no erythema  Neck: -  No gross swelling, no palpable masses -  Thyroid midline, not enlarged, mobile, no palpable masses  Cardiovascular: -  RRR -  No LE edema  Respiratory: -  Normal respiratory effort, no accessory muscle use, no retraction -  Breath sounds equal, no wheezes, no ronchi, no crackles  Neurological:  -  CN II - XII grossly intact -  DTR's equal and symmetric -  Sensation grossly intact to light touch, equal bilaterally  Musculoskeletal: L shoulder- ttp over coracoid process; neg Neer's, cross over, lift off, hawkins, +speed's - L hip- +Stinchfield, neg FABER, FADDIR, log roll; +ttp over the med hip flexor - L knee- no ttp, no effusion, neg varus/valgus, Stine's, Lachman's  Skin: -  No significant lesion on inspection -  Warm and dry to palpation   ASSESSMENT/PLAN: Essential hypertension - Plan: amLODIPine-Valsartan-HCTZ 10-320-25 MG TABS  Chronic left shoulder pain - Plan: Ambulatory referral to Physical Therapy  Left groin pain - Plan: DG HIP UNILAT W OR W/O PELVIS 2-3 VIEWS LEFT  Chronic pain of left knee - Plan: DG Knee Complete 4 Views Left  Need  for vaccination against Streptococcus pneumoniae - Plan: Pneumococcal polysaccharide vaccine 23-valent greater than or equal to 2yo subcutaneous/IM  Condense BP meds. Counseled on diet and exercise. PT for shoulder. Ck XR's for other jts, consider inj vs pt pending results. Hold statin for 4 weeks.  Patient should return in 4 weeks. The patient voiced understanding and agreement to the plan.   [place], DO 12/26/18  11:29 AM

## 2018-12-27 ENCOUNTER — Other Ambulatory Visit: Payer: Self-pay | Admitting: Family Medicine

## 2018-12-27 ENCOUNTER — Telehealth: Payer: Self-pay

## 2018-12-27 ENCOUNTER — Encounter: Payer: Self-pay | Admitting: Family Medicine

## 2018-12-27 DIAGNOSIS — M1612 Unilateral primary osteoarthritis, left hip: Secondary | ICD-10-CM

## 2018-12-27 MED ORDER — AMLODIPINE BESYLATE 10 MG PO TABS: 10.0000 mg | ORAL_TABLET | Freq: Every day | ORAL | 3 refills | Status: DC

## 2018-12-27 MED ORDER — OLMESARTAN MEDOXOMIL-HCTZ 40-25 MG PO TABS: 1.0000 | ORAL_TABLET | Freq: Every day | ORAL | 3 refills | Status: DC

## 2018-12-27 NOTE — Telephone Encounter (Signed)
PA initiated via Covermymeds; KEY: [id number]. Awaiting determination.

## 2018-12-27 NOTE — Telephone Encounter (Signed)
Called informed the patients wife of change. She verbalized understanding.

## 2018-12-27 NOTE — Telephone Encounter (Signed)
Rx not covered. Preferred: irbesartan-hct, olmesartan-hct, and telmisartan-hct.

## 2018-12-27 NOTE — Telephone Encounter (Signed)
Left: Norvasc 10 mg daily again and do olmesartan-HCT combination, sent in. Please inform pt.

## 2019-01-07 ENCOUNTER — Ambulatory Visit: Payer: Medicare Other | Admitting: Physical Therapy

## 2019-01-09 ENCOUNTER — Ambulatory Visit: Payer: Medicare Other | Admitting: Physical Therapy

## 2019-01-23 ENCOUNTER — Ambulatory Visit (INDEPENDENT_AMBULATORY_CARE_PROVIDER_SITE_OTHER): Payer: Medicare Other | Admitting: Family Medicine

## 2019-01-23 ENCOUNTER — Encounter: Payer: Self-pay | Admitting: Family Medicine

## 2019-01-23 VITALS — BP 132/82 | HR 77 | Temp 98.0°F | Ht 71.0 in | Wt 225.0 lb

## 2019-01-23 DIAGNOSIS — M1612 Unilateral primary osteoarthritis, left hip: Secondary | ICD-10-CM | POA: Diagnosis not present

## 2019-01-23 DIAGNOSIS — I10 Essential (primary) hypertension: Secondary | ICD-10-CM | POA: Diagnosis not present

## 2019-01-23 NOTE — Progress Notes (Signed)
Chief Complaint  Patient presents with  . Follow-up    Subjective Bryan Rivers is a 78 y.o. male who presents for hypertension follow up. He does monitor home blood pressures. Blood pressures ranging from 120's/80's on average. He is compliant with medications- Norvasc 10 mg/d, Benicar Hct 40-25 mg/d. Patient has these side effects of medication: none He is adhering to a healthy diet overall. Current exercise: working out, lifting and some cardio  Has L hip OA dx'd by XR. Not interested in surgery at this time as he is going to [place] soon.   Past Medical History:  Diagnosis Date  . Hypertension   . Morbid obesity ([place]) 02/11/2013  . Prostate cancer [facility])     Review of Systems Cardiovascular: no chest pain Respiratory:  no shortness of breath  Exam BP 132/82 (BP Location: Right Arm, Patient Position: Sitting, Cuff Size: Large)   Pulse 77   Temp 98 F (36.7 C) (Oral)   Ht 5\' 11"  (1.803 m)   Wt 225 lb (102.1 kg)   SpO2 95%   BMI 31.38 kg/m  General:  well developed, well nourished, in no apparent distress Heart: RRR, no bruits, no LE edema Lungs: clear to auscultation, no accessory muscle use Psych: well oriented with normal range of affect and appropriate judgment/insight  Essential hypertension  Arthritis of left hip  Cont meds.  Counseled on diet and exercise. Doing suprisingly well for what his hip looked like. F/u in 6 mo or prn. The patient voiced understanding and agreement to the plan.  [place], DO 01/23/19  11:17 AM

## 2019-01-23 NOTE — Patient Instructions (Addendum)
Keep the diet clean and stay active.  Let me know if you need a 30 d or 90 d supply when you request a refill.  Because your blood pressure is well-controlled, you no longer have to check your blood pressure at home anymore unless you wish. Some people check it twice daily every day and some people stop altogether. Either or anything in between is fine. Strong work!  Let us know if you need anything.

## 2019-01-28 ENCOUNTER — Ambulatory Visit (HOSPITAL_BASED_OUTPATIENT_CLINIC_OR_DEPARTMENT_OTHER)
Admission: RE | Admit: 2019-01-28 | Discharge: 2019-01-28 | Disposition: A | Discharge status: Home / Self Care | Payer: Medicare Other | Source: Ambulatory Visit | Attending: Specialist | Admitting: Specialist

## 2019-01-28 ENCOUNTER — Encounter (HOSPITAL_COMMUNITY): Payer: Self-pay | Admitting: Emergency Medicine

## 2019-01-28 ENCOUNTER — Other Ambulatory Visit (HOSPITAL_COMMUNITY): Payer: Self-pay | Admitting: Specialist

## 2019-01-28 ENCOUNTER — Emergency Department (HOSPITAL_COMMUNITY)
Admission: EM | Admit: 2019-01-28 | Discharge: 2019-01-28 | Disposition: A | Discharge status: Home / Self Care | Payer: Medicare Other | Attending: Emergency Medicine | Admitting: Emergency Medicine

## 2019-01-28 ENCOUNTER — Other Ambulatory Visit: Payer: Self-pay

## 2019-01-28 DIAGNOSIS — I10 Essential (primary) hypertension: Secondary | ICD-10-CM | POA: Insufficient documentation

## 2019-01-28 DIAGNOSIS — M7989 Other specified soft tissue disorders: Secondary | ICD-10-CM | POA: Insufficient documentation

## 2019-01-28 DIAGNOSIS — I743 Embolism and thrombosis of arteries of the lower extremities: Secondary | ICD-10-CM

## 2019-01-28 DIAGNOSIS — M79605 Pain in left leg: Secondary | ICD-10-CM

## 2019-01-28 DIAGNOSIS — Z79899 Other long term (current) drug therapy: Secondary | ICD-10-CM | POA: Insufficient documentation

## 2019-01-28 DIAGNOSIS — I70202 Unspecified atherosclerosis of native arteries of extremities, left leg: Secondary | ICD-10-CM | POA: Insufficient documentation

## 2019-01-28 DIAGNOSIS — Z8546 Personal history of malignant neoplasm of prostate: Secondary | ICD-10-CM | POA: Insufficient documentation

## 2019-01-28 LAB — COMPREHENSIVE METABOLIC PANEL
ALBUMIN: 3.7 g/dL (ref 3.5–5.0)
ALK PHOS: 65 U/L (ref 38–126)
ALT: 43 U/L (ref 0–44)
ANION GAP: 9 (ref 5–15)
AST: 23 U/L (ref 15–41)
BILIRUBIN TOTAL: 0.3 mg/dL (ref 0.3–1.2)
BUN: 31 mg/dL — ABNORMAL HIGH (ref 8–23)
CALCIUM: 9.3 mg/dL (ref 8.9–10.3)
CO2: 25 mmol/L (ref 22–32)
Chloride: 104 mmol/L (ref 98–111)
Creatinine, Ser: 1.23 mg/dL (ref 0.61–1.24)
GFR calc Af Amer: >60 mL/min (ref >60)
GFR calc non Af Amer: 57 mL/min — ABNORMAL LOW (ref >60)
GLUCOSE: 108 mg/dL — AB (ref 70–99)
Potassium: 3.9 mmol/L (ref 3.5–5.1)
SODIUM: 138 mmol/L (ref 135–145)
TOTAL PROTEIN: 6.8 g/dL (ref 6.5–8.1)

## 2019-01-28 LAB — CBC WITH DIFFERENTIAL/PLATELET
Abs Immature Granulocytes: 0.04 10*3/uL (ref 0.00–0.07)
BASOS PCT: 0 %
Basophils Absolute: 0 10*3/uL (ref 0.0–0.1)
EOS ABS: 0.1 10*3/uL (ref 0.0–0.5)
Eosinophils Relative: 1 %
HCT: 44.2 % (ref 39.0–52.0)
Hemoglobin: 14.5 g/dL (ref 13.0–17.0)
Immature Granulocytes: 0 %
Lymphocytes Relative: 20 %
Lymphs Abs: 1.7 10*3/uL (ref 0.7–4.0)
MCH: 28.4 pg (ref 26.0–34.0)
MCHC: 32.8 g/dL (ref 30.0–36.0)
MCV: 86.5 fL (ref 80.0–100.0)
MONO ABS: 0.9 10*3/uL (ref 0.1–1.0)
MONOS PCT: 10 %
NEUTROS PCT: 69 %
Neutro Abs: 6.1 10*3/uL (ref 1.7–7.7)
PLATELETS: 216 10*3/uL (ref 150–400)
RBC: 5.11 MIL/uL (ref 4.22–5.81)
RDW: 13.2 % (ref 11.5–15.5)
WBC: 8.9 10*3/uL (ref 4.0–10.5)
nRBC: 0 % (ref 0.0–0.2)

## 2019-01-28 NOTE — Discharge Instructions (Signed)
Take a daily ASA.

## 2019-01-28 NOTE — H&P (View-Only) (Signed)
REASON FOR CONSULT:    Thrombosed left popliteal artery aneurysm.  The consult is requested by Dr. Tonita Cong.  ASSESSMENT & PLAN:   THROMBOSED LEFT POPLITEAL ARTERY ANEURYSM: This patient most likely thrombosed his left popliteal artery aneurysm approximately 2 weeks ago.  Currently he denies claudication or rest pain in the left leg.  He does admit to some coolness in the foot and paresthesias which improves when his foot is dependent.  He also has a popliteal artery aneurysm on the right side.  He does have a monophasic Doppler signal in the left dorsalis pedis artery without critical limb ischemia.  I have recommended arteriography to evaluate his options for revascularization on the left and to further assess the right popliteal artery aneurysm also.  I have given him the option of staying tonight and trying to get the arteriogram done tomorrow she feels strongly against.  Therefore we will let him go home and I will arrange for aortogram and bilateral lower extremity arteriogram on Friday.  When he returns to the office after his arteriogram for follow-up he will need a duplex of his abdominal aorta to rule out an abdominal aortic aneurysm given that there is an association of abdominal aortic aneurysms and popliteal artery aneurysms.  In addition he will need vein mapping of the left great saphenous vein.  His home number is [phone].  His cell phone number is [phone].   [name], MD, FACS Beeper [phone] Office: [phone]   HPI:   Bryan Rivers is a pleasant 78 y.o. male, who 2 weeks ago, noted the gradual onset of pain in the left calf.  He noticed this when playing golf.  Currently he does not describe significant claudication in the calf and denies rest pain.  He does admit that the foot gets cooler when he is lying flat at night and he also developed some paresthesias.  This improves when he hangs his foot down.  He has no symptoms on the right side.  Patient was seen  by Dr. Tonita Cong today and given the history of calf pain he was sent for venous duplex scan to rule out DVT.  There was no evidence of DVT.  However, an incidental finding was a thrombosed left popliteal artery aneurysm that measured 2 cm in maximum diameter.  This reason he sent to the emergency department for further evaluation.  He has no history of abdominal aortic aneurysms in the family.   He is also been having some left hip pain and apparently has some arthritis of the left hip.  He is being considered for surgery.  Past Medical History:  Diagnosis Date  . Hypertension   . Morbid obesity ([place]) 02/11/2013  . Prostate cancer [facility])     Family History  Problem Relation Age of Onset  . Heart disease Mother   . Diabetes Mother     SOCIAL HISTORY: Social History   Socioeconomic History  . Marital status: Married    Spouse name: Not on file  . Number of children: Not on file  . Years of education: Not on file  . Highest education level: Not on file  Occupational History  . Not on file  Social Needs  . Financial resource strain: Not on file  . Food insecurity:    Worry: Not on file    Inability: Not on file  . Transportation needs:    Medical: Not on file    Non-medical: Not on file  Tobacco Use  . Smoking status:  Never Smoker  . Smokeless tobacco: Never Used  Substance and Sexual Activity  . Alcohol use: No    Alcohol/week: 0.0 standard drinks  . Drug use: No  . Sexual activity: Not on file  Lifestyle  . Physical activity:    Days per week: Not on file    Minutes per session: Not on file  . Stress: Not on file  Relationships  . Social connections:    Talks on phone: Not on file    Gets together: Not on file    Attends religious service: Not on file    Active member of club or organization: Not on file    Attends meetings of clubs or organizations: Not on file    Relationship status: Not on file  . Intimate partner violence:    Fear of current or ex partner: Not  on file    Emotionally abused: Not on file    Physically abused: Not on file    Forced sexual activity: Not on file  Other Topics Concern  . Not on file  Social History Narrative   Married 31 years   1 daughter age 27   Retired    No Known Allergies  No current facility-administered medications for this encounter.    Current Outpatient Medications  Medication Sig Dispense Refill  . amLODipine (NORVASC) 10 MG tablet Take 1 tablet (10 mg total) by mouth daily. 30 tablet 3  . atorvastatin (LIPITOR) 40 MG tablet Take 40 mg by mouth daily.    . metoprolol succinate (TOPROL-XL) 100 MG 24 hr tablet TAKE 1 TABLET BY MOUTH  DAILY    . olmesartan-hydrochlorothiazide (BENICAR HCT) 40-25 MG tablet Take 1 tablet by mouth daily. 30 tablet 3    REVIEW OF SYSTEMS:  [X]  denotes positive finding, [ ]  denotes negative finding Cardiac  Comments:  Chest pain or chest pressure:    Shortness of breath upon exertion:    Short of breath when lying flat:    Irregular heart rhythm:        Vascular    Pain in calf, thigh, or hip brought on by ambulation:    Pain in feet at night that wakes you up from your sleep:     Blood clot in your veins:    Leg swelling:         Pulmonary    Oxygen at home:    Productive cough:     Wheezing:         Neurologic    Sudden weakness in arms or legs:     Sudden numbness in arms or legs:  x  paresthesias in the left foot sometimes when he is in bed at night.  Sudden onset of difficulty speaking or slurred speech:    Temporary loss of vision in one eye:     Problems with dizziness:         Gastrointestinal    Blood in stool:     Vomited blood:         Genitourinary    Burning when urinating:     Blood in urine:        Psychiatric    Major depression:         Hematologic    Bleeding problems:    Problems with blood clotting too easily:        Skin    Rashes or ulcers:        Constitutional    Fever or chills:  PHYSICAL EXAM:   Vitals:    01/28/19 1722  BP: 122/82  Pulse: 74  Resp: 16  Temp: 99 F (37.2 C)  TempSrc: Oral  SpO2: 95%    GENERAL: The patient is a well-nourished male, in no acute distress. The vital signs are documented above. CARDIAC: There is a regular rate and rhythm.  VASCULAR: I do not detect carotid bruits. On the left side, which is the symptomatic side, he has a palpable femoral pulse.  I cannot palpate a popliteal or pedal pulses.  He does have a monophasic left dorsalis pedis signal with the Doppler.  I cannot obtain a posterior tibial or peroneal signal. On the right side, he has a palpable femoral, popliteal, dorsalis pedis, posterior tibial pulse. He has some hyperpigmentation bilaterally consistent with chronic venous insufficiency. He has mild bilateral lower extremity swelling. PULMONARY: There is good air exchange bilaterally without wheezing or rales. ABDOMEN: Soft and non-tender with normal pitched bowel sounds.  I do not palpate an abdominal aortic aneurysm although his abdomen is large and it is difficult to assess for this. MUSCULOSKELETAL: There are no major deformities or cyanosis. NEUROLOGIC: No focal weakness or paresthesias are detected. SKIN: There are no ulcers or rashes noted. PSYCHIATRIC: The patient has a normal affect.  DATA:    ARTERIAL DOPPLER STUDY: I have independently interpreted his arterial Doppler study.  On the left side, which is the symptomatic side, a Doppler signal cannot be obtained in the left foot.  (On my exam I am unable to obtain a dorsalis pedis signal on the left which is reasonable)  On the right side he has a triphasic posterior tibial and dorsalis pedis signal with the Doppler and an ABI of 100%.  Toe pressure on the right is 86 mmHg.  VENOUS DUPLEX: I have independently interpreted his venous duplex scan today.  There is no evidence of DVT.  However, an incidental finding was a thrombosed left popliteal artery aneurysm that measured 2 cm in  diameter.  The right popliteal artery was also looked at and was felt to be aneurysmal also.  This was not thrombosed.

## 2019-01-28 NOTE — Progress Notes (Signed)
Left lower extremity venous and ABI completed. Refer to "CV Proc" under chart review to view preliminary results.  Critical results discussed with Dr. Tonita Cong.  01/28/2019 4:49 PM Maudry Mayhew, MHA, RVT, RDCS, RDMS

## 2019-01-28 NOTE — Consult Note (Signed)
REASON FOR CONSULT:    Thrombosed left popliteal artery aneurysm.  The consult is requested by Dr. Tonita Cong.  ASSESSMENT & PLAN:   THROMBOSED LEFT POPLITEAL ARTERY ANEURYSM: This patient most likely thrombosed his left popliteal artery aneurysm approximately 2 weeks ago.  Currently he denies claudication or rest pain in the left leg.  He does admit to some coolness in the foot and paresthesias which improves when his foot is dependent.  He also has a popliteal artery aneurysm on the right side.  He does have a monophasic Doppler signal in the left dorsalis pedis artery without critical limb ischemia.  I have recommended arteriography to evaluate his options for revascularization on the left and to further assess the right popliteal artery aneurysm also.  I have given him the option of staying tonight and trying to get the arteriogram done tomorrow she feels strongly against.  Therefore we will let him go home and I will arrange for aortogram and bilateral lower extremity arteriogram on Friday.  When he returns to the office after his arteriogram for follow-up he will need a duplex of his abdominal aorta to rule out an abdominal aortic aneurysm given that there is an association of abdominal aortic aneurysms and popliteal artery aneurysms.  In addition he will need vein mapping of the left great saphenous vein.  His home number is [phone].  His cell phone number is [phone].   [name], MD, FACS Beeper [phone] Office: [phone]   HPI:   Bryan Rivers is a pleasant 78 y.o. male, who 2 weeks ago, noted the gradual onset of pain in the left calf.  He noticed this when playing golf.  Currently he does not describe significant claudication in the calf and denies rest pain.  He does admit that the foot gets cooler when he is lying flat at night and he also developed some paresthesias.  This improves when he hangs his foot down.  He has no symptoms on the right side.  Patient was seen  by Dr. Tonita Cong today and given the history of calf pain he was sent for venous duplex scan to rule out DVT.  There was no evidence of DVT.  However, an incidental finding was a thrombosed left popliteal artery aneurysm that measured 2 cm in maximum diameter.  This reason he sent to the emergency department for further evaluation.  He has no history of abdominal aortic aneurysms in the family.   He is also been having some left hip pain and apparently has some arthritis of the left hip.  He is being considered for surgery.  Past Medical History:  Diagnosis Date  . Hypertension   . Morbid obesity ([place]) 02/11/2013  . Prostate cancer [facility])     Family History  Problem Relation Age of Onset  . Heart disease Mother   . Diabetes Mother     SOCIAL HISTORY: Social History   Socioeconomic History  . Marital status: Married    Spouse name: Not on file  . Number of children: Not on file  . Years of education: Not on file  . Highest education level: Not on file  Occupational History  . Not on file  Social Needs  . Financial resource strain: Not on file  . Food insecurity:    Worry: Not on file    Inability: Not on file  . Transportation needs:    Medical: Not on file    Non-medical: Not on file  Tobacco Use  . Smoking status:  Never Smoker  . Smokeless tobacco: Never Used  Substance and Sexual Activity  . Alcohol use: No    Alcohol/week: 0.0 standard drinks  . Drug use: No  . Sexual activity: Not on file  Lifestyle  . Physical activity:    Days per week: Not on file    Minutes per session: Not on file  . Stress: Not on file  Relationships  . Social connections:    Talks on phone: Not on file    Gets together: Not on file    Attends religious service: Not on file    Active member of club or organization: Not on file    Attends meetings of clubs or organizations: Not on file    Relationship status: Not on file  . Intimate partner violence:    Fear of current or ex partner: Not  on file    Emotionally abused: Not on file    Physically abused: Not on file    Forced sexual activity: Not on file  Other Topics Concern  . Not on file  Social History Narrative   Married 24 years   1 daughter age 22   Retired    No Known Allergies  No current facility-administered medications for this encounter.    Current Outpatient Medications  Medication Sig Dispense Refill  . amLODipine (NORVASC) 10 MG tablet Take 1 tablet (10 mg total) by mouth daily. 30 tablet 3  . atorvastatin (LIPITOR) 40 MG tablet Take 40 mg by mouth daily.    . metoprolol succinate (TOPROL-XL) 100 MG 24 hr tablet TAKE 1 TABLET BY MOUTH  DAILY    . olmesartan-hydrochlorothiazide (BENICAR HCT) 40-25 MG tablet Take 1 tablet by mouth daily. 30 tablet 3    REVIEW OF SYSTEMS:  [X]  denotes positive finding, [ ]  denotes negative finding Cardiac  Comments:  Chest pain or chest pressure:    Shortness of breath upon exertion:    Short of breath when lying flat:    Irregular heart rhythm:        Vascular    Pain in calf, thigh, or hip brought on by ambulation:    Pain in feet at night that wakes you up from your sleep:     Blood clot in your veins:    Leg swelling:         Pulmonary    Oxygen at home:    Productive cough:     Wheezing:         Neurologic    Sudden weakness in arms or legs:     Sudden numbness in arms or legs:  x  paresthesias in the left foot sometimes when he is in bed at night.  Sudden onset of difficulty speaking or slurred speech:    Temporary loss of vision in one eye:     Problems with dizziness:         Gastrointestinal    Blood in stool:     Vomited blood:         Genitourinary    Burning when urinating:     Blood in urine:        Psychiatric    Major depression:         Hematologic    Bleeding problems:    Problems with blood clotting too easily:        Skin    Rashes or ulcers:        Constitutional    Fever or chills:  PHYSICAL EXAM:   Vitals:    01/28/19 1722  BP: 122/82  Pulse: 74  Resp: 16  Temp: 99 F (37.2 C)  TempSrc: Oral  SpO2: 95%    GENERAL: The patient is a well-nourished male, in no acute distress. The vital signs are documented above. CARDIAC: There is a regular rate and rhythm.  VASCULAR: I do not detect carotid bruits. On the left side, which is the symptomatic side, he has a palpable femoral pulse.  I cannot palpate a popliteal or pedal pulses.  He does have a monophasic left dorsalis pedis signal with the Doppler.  I cannot obtain a posterior tibial or peroneal signal. On the right side, he has a palpable femoral, popliteal, dorsalis pedis, posterior tibial pulse. He has some hyperpigmentation bilaterally consistent with chronic venous insufficiency. He has mild bilateral lower extremity swelling. PULMONARY: There is good air exchange bilaterally without wheezing or rales. ABDOMEN: Soft and non-tender with normal pitched bowel sounds.  I do not palpate an abdominal aortic aneurysm although his abdomen is large and it is difficult to assess for this. MUSCULOSKELETAL: There are no major deformities or cyanosis. NEUROLOGIC: No focal weakness or paresthesias are detected. SKIN: There are no ulcers or rashes noted. PSYCHIATRIC: The patient has a normal affect.  DATA:    ARTERIAL DOPPLER STUDY: I have independently interpreted his arterial Doppler study.  On the left side, which is the symptomatic side, a Doppler signal cannot be obtained in the left foot.  (On my exam I am unable to obtain a dorsalis pedis signal on the left which is reasonable)  On the right side he has a triphasic posterior tibial and dorsalis pedis signal with the Doppler and an ABI of 100%.  Toe pressure on the right is 86 mmHg.  VENOUS DUPLEX: I have independently interpreted his venous duplex scan today.  There is no evidence of DVT.  However, an incidental finding was a thrombosed left popliteal artery aneurysm that measured 2 cm in  diameter.  The right popliteal artery was also looked at and was felt to be aneurysmal also.  This was not thrombosed.

## 2019-01-28 NOTE — ED Triage Notes (Signed)
Pt sent from [place] for +DVT in left leg. States they sent him here to see the vascular surgeon on call

## 2019-01-28 NOTE — ED Provider Notes (Signed)
[facility] EMERGENCY DEPARTMENT Provider Note   CSN: [id number] Arrival date & time: 01/28/19  1716    History   Chief Complaint Chief Complaint  Patient presents with  . DVT    HPI Bryan Rivers is a 78 y.o. male.     Pt presents to the ED today with left leg pain.  He has had pain in his hip and in his leg for the past 2 weeks.  His doctor did xrays of his hip and ordered ABI and [place] LLE.  A popliteal artery occlusion was seen on the US done today for the leg.  The pt was sent here to see vascular.  Pt feels some tingling in his leg and some pain.  Dr. Scot Dock from vascular here to see pt in ED.       Past Medical History:  Diagnosis Date  . Hypertension   . Morbid obesity ([place]) 02/11/2013  . Prostate cancer [facility])     Patient Active Problem List   Diagnosis Date Noted  . Arthritis of left hip 01/23/2019  . Chronic left shoulder pain 12/26/2018  . Chronic pain of left knee 12/26/2018  . Lower back injury, initial encounter 01/02/2017  . Left wrist injury 11/09/2015  . Left rib fracture 11/09/2015  . Morbid obesity ([place]) 02/11/2013  . Hyperglycemia 11/04/2012  . Essential hypertension 09/05/2011  . Hyperlipidemia 09/05/2011  . Malignant neoplasm of prostate ([place]) 09/05/2011    Past Surgical History:  Procedure Laterality Date  . APPENDECTOMY  1950   age 32        Home Medications    Prior to Admission medications   Medication Sig Start Date End Date Taking? Authorizing Provider  amLODipine (NORVASC) 10 MG tablet Take 1 tablet (10 mg total) by mouth daily. 12/27/18   Shelda Pal, DO  atorvastatin (LIPITOR) 40 MG tablet Take 40 mg by mouth daily.    [provider]  metoprolol succinate (TOPROL-XL) 100 MG 24 hr tablet TAKE 1 TABLET BY MOUTH  DAILY 04/30/18   [provider]  olmesartan-hydrochlorothiazide (BENICAR HCT) 40-25 MG tablet Take 1 tablet by mouth daily. 12/27/18   Shelda Pal, DO    Family  History Family History  Problem Relation Age of Onset  . Heart disease Mother   . Diabetes Mother     Social History Social History   Tobacco Use  . Smoking status: Never Smoker  . Smokeless tobacco: Never Used  Substance Use Topics  . Alcohol use: No    Alcohol/week: 0.0 standard drinks  . Drug use: No     Allergies   Patient has no known allergies.   Review of Systems Review of Systems  Musculoskeletal:       Left leg pain  All other systems reviewed and are negative.    Physical Exam Updated Vital Signs BP 122/82 (BP Location: Right Arm)   Pulse 74   Temp 99 F (37.2 C) (Oral)   Resp 16   SpO2 95%   Physical Exam Vitals signs and nursing note reviewed.  Constitutional:      Appearance: Normal appearance.  HENT:     Head: Normocephalic and atraumatic.     Right Ear: External ear normal.     Left Ear: External ear normal.     Nose: Nose normal.     Mouth/Throat:     Mouth: Mucous membranes are moist.  Eyes:     Extraocular Movements: Extraocular movements intact.  Pupils: Pupils are equal, round, and reactive to light.  Neck:     Musculoskeletal: Normal range of motion.  Cardiovascular:     Rate and Rhythm: Normal rate and regular rhythm.     Heart sounds: Normal heart sounds.  Pulmonary:     Effort: Pulmonary effort is normal.     Breath sounds: Normal breath sounds.  Abdominal:     General: Abdomen is flat.  Musculoskeletal: Normal range of motion.     Comments: Left lower leg mildly discolored and cooler than right. +dopplerable pulses in DP  Skin:    Capillary Refill: Capillary refill takes less than 2 seconds.  Neurological:     General: No focal deficit present.     Mental Status: He is alert and oriented to person, place, and time.  Psychiatric:        Mood and Affect: Mood normal.        Behavior: Behavior normal.      ED Treatments / Results  Labs (all labs ordered are listed, but only abnormal results are displayed) Labs  Reviewed  CBC WITH DIFFERENTIAL/PLATELET  COMPREHENSIVE METABOLIC PANEL    EKG None  Radiology Abi With/wo Tbi  Result Date: 01/28/2019 LOWER EXTREMITY DOPPLER STUDY Indications: Rest pain.  Performing Technologist: Maudry Mayhew MHA, RVT, RDCS, RDMS  Examination Guidelines: A complete evaluation includes at minimum, Doppler waveform signals and systolic blood pressure reading at the level of bilateral brachial, anterior tibial, and posterior tibial arteries, when vessel segments are accessible. Bilateral testing is considered an integral part of a complete examination. Photoelectric Plethysmograph (PPG) waveforms and toe systolic pressure readings are included as required and additional duplex testing as needed. Limited examinations for reoccurring indications may be performed as noted.  ABI Findings: +---------+------------------+-----+---------+--------+ Right    Rt Pressure (mmHg)IndexWaveform Comment  +---------+------------------+-----+---------+--------+ Brachial 127                    triphasic         +---------+------------------+-----+---------+--------+ PTA      141               1.10 triphasic         +---------+------------------+-----+---------+--------+ DP       155               1.21 triphasic         +---------+------------------+-----+---------+--------+ Great Toe86                0.67                   +---------+------------------+-----+---------+--------+ +---------+------------------+-----+---------+-------+ Left     Lt Pressure (mmHg)IndexWaveform Comment +---------+------------------+-----+---------+-------+ Brachial 128                    triphasic        +---------+------------------+-----+---------+-------+ PTA                             absent           +---------+------------------+-----+---------+-------+ DP                              absent           +---------+------------------+-----+---------+-------+ Great  Toe                       absent           +---------+------------------+-----+---------+-------+ +-------+-----------+-----------+------------+------------+  ABI/TBIToday's ABIToday's TBIPrevious ABIPrevious TBI +-------+-----------+-----------+------------+------------+ Right  1.21       0.67                                +-------+-----------+-----------+------------+------------+ Left   0.00       0.00                                +-------+-----------+-----------+------------+------------+  Summary: Right: Resting right ankle-brachial index is within normal range. No evidence of significant right lower extremity arterial disease. The right toe-brachial index is abnormal. Left: Resting left ankle-brachial index indicates critical left limb ischemia.  *See table(s) above for measurements and observations.  Electronically signed by [name] MD on 01/28/2019 at 5:56:37 PM.   Final    Vas [place] Lower Extremity Venous (dvt)  Result Date: 01/28/2019  Lower Venous Study Indications: Pain.  Performing Technologist: Maudry Mayhew MHA, RDMS, RVT, RDCS  Examination Guidelines: A complete evaluation includes B-mode imaging, spectral Doppler, color Doppler, and power Doppler as needed of all accessible portions of each vessel. Bilateral testing is considered an integral part of a complete examination. Limited examinations for reoccurring indications may be performed as noted.  Left Venous Findings: +---------+---------------+---------+-----------+----------+-------+          CompressibilityPhasicitySpontaneityPropertiesSummary +---------+---------------+---------+-----------+----------+-------+ CFV      Full           Yes      Yes                          +---------+---------------+---------+-----------+----------+-------+ SFJ      Full                                                 +---------+---------------+---------+-----------+----------+-------+ FV Prox  Full                                                  +---------+---------------+---------+-----------+----------+-------+ FV Mid   Full                                                 +---------+---------------+---------+-----------+----------+-------+ FV DistalFull                                                 +---------+---------------+---------+-----------+----------+-------+ PFV      Full                                                 +---------+---------------+---------+-----------+----------+-------+ POP      Full           Yes      Yes                          +---------+---------------+---------+-----------+----------+-------+  PTV      Full                                                 +---------+---------------+---------+-----------+----------+-------+ PERO     Full                                                 +---------+---------------+---------+-----------+----------+-------+ Incidental finding: there is 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 For completeness, the right popliteal artery was also evaluated and found to have aneurysmal characteristics as well.    Summary: Left: There is no evidence of deep vein thrombosis in the lower extremity. No cystic structure found in the popliteal fossa.  *See table(s) above for measurements and observations. Electronically signed by [name] MD on 01/28/2019 at 5:57:39 PM.    Final     Procedures Procedures (including critical care time)  Medications Ordered in ED Medications - No data to display   Initial Impression / Assessment and Plan / ED Course  I have reviewed the triage vital signs and the nursing notes.  Pertinent labs & imaging results that were available during my care of the patient were reviewed by me and considered in my medical decision making (see chart for details).   Dr. Scot Dock offered him admission to get arteriography tomorrow to  evaluate options for surgery.  Pt does not want to be admitted.  Dr. Nicole Cella office will call him to set him up for an arteriogram for Friday the 6th.  Dr. Scot Dock thinks event occurred 2 weeks ago when the initial pain started.  Pt to be d/c home on ASA.  Return if worse.  Final Clinical Impressions(s) / ED Diagnoses   Final diagnoses:  Left popliteal artery occlusion [facility])    ED Discharge Orders    None       Isla Pence, MD 01/28/19 [phone]

## 2019-01-28 NOTE — ED Notes (Signed)
Patient verbalizes understanding of discharge instructions. Opportunity for questioning and answers were provided. Armband removed by staff, pt discharged from ED ambulatory to home.  

## 2019-01-29 ENCOUNTER — Other Ambulatory Visit: Payer: Self-pay | Admitting: *Deleted

## 2019-01-29 DIAGNOSIS — I70202 Unspecified atherosclerosis of native arteries of extremities, left leg: Secondary | ICD-10-CM

## 2019-01-29 DIAGNOSIS — M79605 Pain in left leg: Secondary | ICD-10-CM

## 2019-01-29 DIAGNOSIS — M7989 Other specified soft tissue disorders: Principal | ICD-10-CM

## 2019-01-31 ENCOUNTER — Encounter (HOSPITAL_COMMUNITY)
Admission: RE | Disposition: A | Discharge status: Home / Self Care | Payer: Self-pay | Source: Home / Self Care | Attending: Vascular Surgery

## 2019-01-31 ENCOUNTER — Ambulatory Visit (HOSPITAL_COMMUNITY)
Admission: RE | Admit: 2019-01-31 | Discharge: 2019-01-31 | Disposition: A | Discharge status: Home / Self Care | Payer: Medicare Other | Attending: Vascular Surgery | Admitting: Vascular Surgery

## 2019-01-31 ENCOUNTER — Other Ambulatory Visit: Payer: Self-pay

## 2019-01-31 ENCOUNTER — Encounter (HOSPITAL_COMMUNITY): Payer: Self-pay | Admitting: Vascular Surgery

## 2019-01-31 DIAGNOSIS — Z8249 Family history of ischemic heart disease and other diseases of the circulatory system: Secondary | ICD-10-CM | POA: Insufficient documentation

## 2019-01-31 DIAGNOSIS — I10 Essential (primary) hypertension: Secondary | ICD-10-CM | POA: Insufficient documentation

## 2019-01-31 DIAGNOSIS — M1612 Unilateral primary osteoarthritis, left hip: Secondary | ICD-10-CM | POA: Diagnosis not present

## 2019-01-31 DIAGNOSIS — I724 Aneurysm of artery of lower extremity: Secondary | ICD-10-CM | POA: Insufficient documentation

## 2019-01-31 DIAGNOSIS — Z79899 Other long term (current) drug therapy: Secondary | ICD-10-CM | POA: Insufficient documentation

## 2019-01-31 DIAGNOSIS — R202 Paresthesia of skin: Secondary | ICD-10-CM | POA: Insufficient documentation

## 2019-01-31 DIAGNOSIS — I743 Embolism and thrombosis of arteries of the lower extremities: Secondary | ICD-10-CM | POA: Diagnosis not present

## 2019-01-31 HISTORY — PX: ABDOMINAL AORTOGRAM W/LOWER EXTREMITY: CATH118223

## 2019-01-31 HISTORY — DX: Aneurysm of artery of lower extremity: I72.4

## 2019-01-31 SURGERY — ABDOMINAL AORTOGRAM W/LOWER EXTREMITY
Anesthesia: LOCAL | Laterality: Bilateral

## 2019-01-31 MED ORDER — HEPARIN (PORCINE) IN NACL 1000-0.9 UT/500ML-% IV SOLN
INTRAVENOUS | Status: AC
  Filled 2019-01-31: qty 1000

## 2019-01-31 MED ORDER — SODIUM CHLORIDE 0.9 % IV SOLN
INTRAVENOUS | Status: DC
  Administered 2019-01-31: 07:00:00 via INTRAVENOUS

## 2019-01-31 MED ORDER — SODIUM CHLORIDE 0.9 % IV SOLN: 250.0000 mL | INTRAVENOUS | Status: DC | PRN

## 2019-01-31 MED ORDER — FENTANYL CITRATE (PF) 100 MCG/2ML IJ SOLN
INTRAMUSCULAR | Status: DC | PRN
  Administered 2019-01-31: 50 ug via INTRAVENOUS

## 2019-01-31 MED ORDER — FENTANYL CITRATE (PF) 100 MCG/2ML IJ SOLN
INTRAMUSCULAR | Status: AC
  Filled 2019-01-31: qty 2

## 2019-01-31 MED ORDER — HEPARIN (PORCINE) IN NACL 1000-0.9 UT/500ML-% IV SOLN
INTRAVENOUS | Status: DC | PRN
  Administered 2019-01-31 (×2): 500 mL

## 2019-01-31 MED ORDER — LIDOCAINE HCL (PF) 1 % IJ SOLN
INTRAMUSCULAR | Status: AC
  Filled 2019-01-31: qty 30

## 2019-01-31 MED ORDER — ONDANSETRON HCL 4 MG/2ML IJ SOLN: 4.0000 mg | Freq: Four times a day (QID) | INTRAMUSCULAR | Status: DC | PRN

## 2019-01-31 MED ORDER — ACETAMINOPHEN 325 MG PO TABS: 650.0000 mg | ORAL_TABLET | ORAL | Status: DC | PRN

## 2019-01-31 MED ORDER — MIDAZOLAM HCL 2 MG/2ML IJ SOLN
INTRAMUSCULAR | Status: DC | PRN
  Administered 2019-01-31 (×2): 1 mg via INTRAVENOUS

## 2019-01-31 MED ORDER — LIDOCAINE HCL (PF) 1 % IJ SOLN
INTRAMUSCULAR | Status: DC | PRN
  Administered 2019-01-31: 18 mL via INTRADERMAL

## 2019-01-31 MED ORDER — IODIXANOL 320 MG/ML IV SOLN
INTRAVENOUS | Status: DC | PRN
  Administered 2019-01-31: 119 mL via INTRA_ARTERIAL

## 2019-01-31 MED ORDER — SODIUM CHLORIDE 0.9 % WEIGHT BASED INFUSION: 1.0000 mL/kg/h | INTRAVENOUS | Status: DC

## 2019-01-31 MED ORDER — LABETALOL HCL 5 MG/ML IV SOLN: 10.0000 mg | INTRAVENOUS | Status: DC | PRN

## 2019-01-31 MED ORDER — SODIUM CHLORIDE 0.9% FLUSH: 3.0000 mL | INTRAVENOUS | Status: DC | PRN

## 2019-01-31 MED ORDER — HYDRALAZINE HCL 20 MG/ML IJ SOLN: 5.0000 mg | INTRAMUSCULAR | Status: DC | PRN

## 2019-01-31 MED ORDER — MIDAZOLAM HCL 2 MG/2ML IJ SOLN
INTRAMUSCULAR | Status: AC
  Filled 2019-01-31: qty 2

## 2019-01-31 MED ORDER — SODIUM CHLORIDE 0.9% FLUSH: 3.0000 mL | Freq: Two times a day (BID) | INTRAVENOUS | Status: DC

## 2019-01-31 SURGICAL SUPPLY — 11 items
CATH ANGIO 5F PIGTAIL 65CM (CATHETERS) ×2 IMPLANT
CATH BEACON 5 .035 65 RIM TIP (CATHETERS) ×2 IMPLANT
CATH STRAIGHT 5FR 65CM (CATHETERS) ×2 IMPLANT
GUIDEWIRE ANGLED .035X150CM (WIRE) ×2 IMPLANT
KIT MICROPUNCTURE NIT STIFF (SHEATH) ×2 IMPLANT
KIT PV (KITS) ×2 IMPLANT
SHEATH PINNACLE 5F 10CM (SHEATH) ×2 IMPLANT
SYR MEDRAD MARK 7 150ML (SYRINGE) ×2 IMPLANT
TRANSDUCER W/STOPCOCK (MISCELLANEOUS) ×2 IMPLANT
TRAY PV CATH (CUSTOM PROCEDURE TRAY) ×2 IMPLANT
WIRE HITORQ VERSACORE ST 145CM (WIRE) ×2 IMPLANT

## 2019-01-31 NOTE — Progress Notes (Signed)
Dr Scot Dock called and informed unable to get pulses in left foot with doppler. State he will come by and check it. Pt and family informed

## 2019-01-31 NOTE — Interval H&P Note (Signed)
History and Physical Interval Note:  01/31/2019 7:53 AM  Bryan Rivers  has presented today for surgery, with the diagnosis of poor flow  The various methods of treatment have been discussed with the patient and family. After consideration of risks, benefits and other options for treatment, the patient has consented to  Procedure(s): ABDOMINAL AORTOGRAM W/LOWER EXTREMITY (Bilateral) as a surgical intervention .  The patient's history has been reviewed, patient examined, no change in status, stable for surgery.  I have reviewed the patient's chart and labs.  Questions were answered to the patient's satisfaction.     [name]

## 2019-01-31 NOTE — Discharge Instructions (Signed)
Femoral Site Care °This sheet gives you information about how to care for yourself after your procedure. Your health care provider may also give you more specific instructions. If you have problems or questions, contact your health care provider. °What can I expect after the procedure? °After the procedure, it is common to have: °· Bruising that usually fades within 1-2 weeks. °· Tenderness at the site. °Follow these instructions at home: °Wound care °· Follow instructions from your health care provider about how to take care of your insertion site. Make sure you: °? Wash your hands with soap and water before you change your bandage (dressing). If soap and water are not available, use hand sanitizer. °? Change your dressing as told by your health care provider. °? Leave stitches (sutures), skin glue, or adhesive strips in place. These skin closures may need to stay in place for 2 weeks or longer. If adhesive strip edges start to loosen and curl up, you may trim the loose edges. Do not remove adhesive strips completely unless your health care provider tells you to do that. °· Do not take baths, swim, or use a hot tub until your health care provider approves. °· You may shower 24-48 hours after the procedure or as told by your health care provider. °? Gently wash the site with plain soap and water. °? Pat the area dry with a clean towel. °? Do not rub the site. This may cause bleeding. °· Do not apply powder or lotion to the site. Keep the site clean and dry. °· Check your femoral site every day for signs of infection. Check for: °? Redness, swelling, or pain. °? Fluid or blood. °? Warmth. °? Pus or a bad smell. °Activity °· For the first 2-3 days after your procedure, or as long as directed: °? Avoid climbing stairs as much as possible. °? Do not squat. °· Do not lift anything that is heavier than 10 lb (4.5 kg), or the limit that you are told, until your health care provider says that it is safe. °· Rest as  directed. °? Avoid sitting for a long time without moving. Get up to take short walks every 1-2 hours. °· Do not drive for 24 hours if you were given a medicine to help you relax (sedative). °General instructions °· Take over-the-counter and prescription medicines only as told by your health care provider. °· Keep all follow-up visits as told by your health care provider. This is important. °Contact a health care provider if you have: °· A fever or chills. °· You have redness, swelling, or pain around your insertion site. °Get help right away if: °· The catheter insertion area swells very fast. °· You pass out. °· You suddenly start to sweat or your skin gets clammy. °· The catheter insertion area is bleeding, and the bleeding does not stop when you hold steady pressure on the area. °· The area near or just beyond the catheter insertion site becomes pale, cool, tingly, or numb. °These symptoms may represent a serious problem that is an emergency. Do not wait to see if the symptoms will go away. Get medical help right away. Call your local emergency services (911 in the [place] Do not drive yourself to the hospital. °Summary °· After the procedure, it is common to have bruising that usually fades within 1-2 weeks. °· Check your femoral site every day for signs of infection. °· Do not lift anything that is heavier than 10 lb (4.5 kg), or the   limit that you are told, until your health care provider says that it is safe. °This information is not intended to replace advice given to you by your health care provider. Make sure you discuss any questions you have with your health care provider. °Document Released: 07/17/2014 Document Revised: 11/26/2017 Document Reviewed: 11/26/2017 °Elsevier Interactive Patient Education © 2019 [employer] ° °

## 2019-01-31 NOTE — Op Note (Signed)
PATIENT: Bryan Rivers      MRN: [id number] DOB: 1941-10-02    DATE OF PROCEDURE: 01/31/2019  INDICATIONS:    Bryan Rivers is a 78 y.o. male who presented with a two-week history of left leg pain and an incidental finding on a venous duplex scan with a thrombosed left popliteal artery aneurysm.  He presents for arteriography to plan repair of his thrombosed popliteal artery aneurysm.  PROCEDURE:    1.  Ultrasound-guided access to the right common femoral artery 2.  Aortogram with bilateral iliac arteriogram 3.  Selective catheterization of the left external iliac artery with left lower extremity runoff 4.  Retrograde right femoral arteriogram with right lower extremity runoff 5.  Conscious sedation  SURGEON: Judeth Cornfield. Scot Dock, MD, FACS  ANESTHESIA: Local with sedation  EBL: Minimal  TECHNIQUE: The patient was brought to the peripheral vascular lab and was sedated.  The period of conscious sedation was 52 minutes.  During that time period, I was present face-to-face 100% of the time.  The patient was administered 1 mg of Versed and 50 mcg of fentanyl. The patient's heart rate, blood pressure, and oxygen saturation were monitored by the nurse continuously during the procedure.  Both groins were prepped and draped in the usual sterile fashion.  Under ultrasound guidance, after the skin was anesthetized, I cannulated the common femoral artery with a micropuncture needle and a micropuncture sheath was introduced over a wire.  This was exchanged for a 5 [place] sheath over a Bentson wire.  By ultrasound the femoral artery was patent. A real-time image was obtained and sent to the server.  A pigtail catheter was positioned at the L1 vertebral body and flush aortogram obtained.  An oblique iliac projection was then obtained after the catheter was positioned above the bifurcation.  I then exchanged the pigtail catheter for a rim catheter which was positioned into the left common iliac artery.  I  advanced a Glidewire into the external iliac artery and then exchanged the rim catheter for straight catheter.  Selective left external iliac arteriogram was obtained with left lower extremity runoff.  This catheter was then removed and a retrograde right femoral arteriogram obtained with right lower extremity runoff.  FINDINGS:   1.  There are 2 renal arteries on the right and 2 renal arteries on the left.  There is no significant aortic stenosis.  I do not identify an aneurysm in the infrarenal aorta however he will need a duplex scan to rule this out given that he may have laminated thrombus. 2.  The common iliac arteries, external iliac arteries, and hypogastric arteries are patent.  These arteries are enlarged and ectatic.  They are not aneurysmal however. 3.  On the left side, which is the symptomatic side, the common femoral, proximal superficial femoral, and deep femoral arteries are patent.  The superficial femoral artery is occluded above the adductor canal.  The distal superficial femoral artery, popliteal artery, anterior tibial artery, tibial peroneal trunk, and and posterior tibial arteries are occluded.  There is reconstitution of the peroneal artery on the left and then reconstitution of the distal anterior tibial artery in the left leg. 4.  On the right side, the common femoral, deep femoral, superficial femoral, and popliteal arteries are patent.  There is some dilatation of the popliteal artery behind the knee which is slightly enlarged compared to his native vessels which are large.  Below that the anterior tibial and posterior tibial arteries are occluded.  There is  single-vessel runoff via the peroneal artery and there is reconstitution of the distal anterior tibial artery.  CLINICAL NOTE: He will come to the office for duplex of his abdominal aorta and also vein map of the left great saphenous vein.  He will be scheduled for elective left superficial femoral artery to peroneal artery  bypass.  Ultimately he will need a duplex of his right popliteal artery and if he has significant enlargement here he could be considered for popliteal stenting versus bypass.  [name], MD, FACS [facility]  DATE OF DICTATION:   01/31/2019

## 2019-02-03 ENCOUNTER — Ambulatory Visit (INDEPENDENT_AMBULATORY_CARE_PROVIDER_SITE_OTHER)
Admission: RE | Admit: 2019-02-03 | Discharge: 2019-02-03 | Disposition: A | Discharge status: Home / Self Care | Payer: Medicare Other | Source: Ambulatory Visit | Attending: Surgery | Admitting: Surgery

## 2019-02-03 ENCOUNTER — Ambulatory Visit (HOSPITAL_COMMUNITY)
Admission: RE | Admit: 2019-02-03 | Discharge: 2019-02-03 | Disposition: A | Discharge status: Home / Self Care | Payer: Medicare Other | Source: Ambulatory Visit | Attending: Surgery | Admitting: Surgery

## 2019-02-03 DIAGNOSIS — M7989 Other specified soft tissue disorders: Secondary | ICD-10-CM | POA: Diagnosis present

## 2019-02-03 DIAGNOSIS — M79605 Pain in left leg: Secondary | ICD-10-CM | POA: Insufficient documentation

## 2019-02-03 DIAGNOSIS — I70202 Unspecified atherosclerosis of native arteries of extremities, left leg: Secondary | ICD-10-CM | POA: Insufficient documentation

## 2019-02-05 ENCOUNTER — Other Ambulatory Visit: Payer: Self-pay | Admitting: *Deleted

## 2019-02-05 ENCOUNTER — Encounter: Payer: Self-pay | Admitting: *Deleted

## 2019-02-05 ENCOUNTER — Other Ambulatory Visit: Payer: Self-pay

## 2019-02-05 ENCOUNTER — Encounter: Payer: Self-pay | Admitting: Vascular Surgery

## 2019-02-05 ENCOUNTER — Ambulatory Visit (INDEPENDENT_AMBULATORY_CARE_PROVIDER_SITE_OTHER): Payer: Medicare Other | Admitting: Vascular Surgery

## 2019-02-05 VITALS — BP 127/81 | HR 68 | Temp 98.4°F | Resp 16 | Ht 71.0 in | Wt 225.0 lb

## 2019-02-05 DIAGNOSIS — I70202 Unspecified atherosclerosis of native arteries of extremities, left leg: Secondary | ICD-10-CM | POA: Diagnosis not present

## 2019-02-05 NOTE — Progress Notes (Signed)
REASON FOR VISIT:    To discuss left lower extremity bypass.  ASSESSMENT & PLAN:   THROMBOSED LEFT POPLITEAL ARTERY ANEURYSM: The patient appears to be a good candidate for a left superficial femoral artery to peroneal artery bypass with a vein graft.  The vein becomes small in the mid calf however I think if we take the vein from the saphenofemoral junction down, given that this is a bypass from the mid SFA we should have enough vein of good quality.  I have discussed the indications for the procedure and the potential complications, including, but not limited to bleeding, wound healing problems, infection, and graft thrombosis.  I explained that we will need to follow his graft closely.  With respect to his right popliteal artery aneurysm this is only slightly dilated and I suspect we will follow this with duplex and only consider repair if it enlarges.  All of his questions were answered and he is agreeable to proceed.  Her surgery has been scheduled for 02/21/2019.  He is on aspirin and is on a statin.  Bryan Mayo, MD, FACS Beeper [phone] Office: [phone]   HPI:   Bryan Rivers is a pleasant 78 y.o. male, who I saw in consultation on 01/28/2019 with a thrombosed left popliteal artery aneurysm.  2 weeks prior to that he developed a gradual onset of pain in his left calf that he noticed when playing golf.  He noted that the foot was slightly cooler when he was lying flat at night he had developed some paresthesias in the left foot.  He was sent for a DVT study and an incidental finding was a thrombosed left popliteal artery aneurysm.  On exam I was able to obtain a dorsalis pedis signal with the Doppler only.  This was dampened and monophasic.  I set him up for an arteriogram given the ischemia to his left lower extremity secondary to the thrombosed popliteal artery aneurysm.  He underwent an arteriogram on 01/31/2019.  On the left side, which is the symptomatic side, he had occlusion of  the superficial femoral artery above the adductor canal.  There was reconstitution of the peroneal artery in the distal anterior tibial artery on the left.  On the right side there was some mild dilatation of the popliteal artery behind the knee which was slightly enlarged compared to his native vessels which are quite large.  Below that the anterior tibial and posterior tibial arteries were occluded.  There is single-vessel runoff on the right via the peroneal artery.  Since I saw him last, he continues to have some occasional paresthesias in the left foot.  He denies any significant rest pain.  He has claudication on the left.  This involves his calf only.  Past Medical History:  Diagnosis Date  . Hypertension   . Morbid obesity ([place]) 02/11/2013  . Prostate cancer [facility])     Family History  Problem Relation Age of Onset  . Heart disease Mother   . Diabetes Mother     SOCIAL HISTORY: Social History   Socioeconomic History  . Marital status: Married    Spouse name: Not on file  . Number of children: Not on file  . Years of education: Not on file  . Highest education level: Not on file  Occupational History  . Not on file  Social Needs  . Financial resource strain: Not on file  . Food insecurity:    Worry: Not on file    Inability: Not on  file  . Transportation needs:    Medical: Not on file    Non-medical: Not on file  Tobacco Use  . Smoking status: Never Smoker  . Smokeless tobacco: Never Used  Substance and Sexual Activity  . Alcohol use: No    Alcohol/week: 0.0 standard drinks  . Drug use: No  . Sexual activity: Not on file  Lifestyle  . Physical activity:    Days per week: Not on file    Minutes per session: Not on file  . Stress: Not on file  Relationships  . Social connections:    Talks on phone: Not on file    Gets together: Not on file    Attends religious service: Not on file    Active member of club or organization: Not on file    Attends meetings of  clubs or organizations: Not on file    Relationship status: Not on file  . Intimate partner violence:    Fear of current or ex partner: Not on file    Emotionally abused: Not on file    Physically abused: Not on file    Forced sexual activity: Not on file  Other Topics Concern  . Not on file  Social History Narrative   Married 73 years   1 daughter age 48   Retired    No Known Allergies  Current Outpatient Medications  Medication Sig Dispense Refill  . olmesartan-hydrochlorothiazide (BENICAR HCT) 40-25 MG tablet Take 1 tablet by mouth daily. (Patient taking differently: Take 1 tablet by mouth every evening. ) 30 tablet 3  . Polyethyl Glycol-Propyl Glycol (LUBRICANT EYE DROPS) 0.4-0.3 % SOLN Place 1 drop into both eyes 3 (three) times daily as needed (dry/irritated eyes.).     No current facility-administered medications for this visit.     REVIEW OF SYSTEMS:  [X]  denotes positive finding, [ ]  denotes negative finding Cardiac  Comments:  Chest pain or chest pressure:    Shortness of breath upon exertion:    Short of breath when lying flat:    Irregular heart rhythm:        Vascular    Pain in calf, thigh, or hip brought on by ambulation:    Pain in feet at night that wakes you up from your sleep:     Blood clot in your veins:    Leg swelling:         Pulmonary    Oxygen at home:    Productive cough:     Wheezing:         Neurologic    Sudden weakness in arms or legs:     Sudden numbness in arms or legs:     Sudden onset of difficulty speaking or slurred speech:    Temporary loss of vision in one eye:     Problems with dizziness:         Gastrointestinal    Blood in stool:     Vomited blood:         Genitourinary    Burning when urinating:     Blood in urine:        Psychiatric    Major depression:         Hematologic    Bleeding problems:    Problems with blood clotting too easily:        Skin    Rashes or ulcers:        Constitutional    Fever or  chills:     PHYSICAL  EXAM:   Vitals:   02/05/19 1350  BP: 127/81  Pulse: 68  Resp: 16  Temp: 98.4 F (36.9 C)  TempSrc: Oral  SpO2: 97%  Weight: 225 lb (102.1 kg)  Height: 5\' 11"  (1.803 m)   GENERAL: The patient is a well-nourished male, in no acute distress. The vital signs are documented above. CARDIAC: There is a regular rate and rhythm.  VASCULAR: I do not detect carotid bruits. On the left side he has a palpable femoral pulse.  He has a peroneal and anterior tibial signal with the Doppler on the left.  There is no posterior tibial signal. On the right side he has a palpable femoral and popliteal pulse.  He has an anterior tibial, dorsalis pedis, peroneal, and posterior tibial signal on the right. PULMONARY: There is good air exchange bilaterally without wheezing or rales. ABDOMEN: Soft and non-tender with normal pitched bowel sounds.  MUSCULOSKELETAL: There are no major deformities or cyanosis. NEUROLOGIC: No focal weakness or paresthesias are detected. SKIN: There are no ulcers or rashes noted. PSYCHIATRIC: The patient has a normal affect.  DATA:    ARTERIOGRAM: The results of his arteriogram are described above.  DUPLEX ABDOMINAL AORTA: I reviewed the duplex of his abdominal aorta that was done on 02/03/2019.  This shows that the maximum diameter of his infrarenal aorta is 2.6 cm.  The maximum diameter of the right common iliac artery is 1.4 cm.  The maximum diameter of the left common iliac artery is 1.7 cm.  SAPHENOUS VEIN MAP: I have reviewed the results of his vein map that was done on 02/03/2019.  This shows that the vein appears to be usable although it does get small in the mid calf where it is 0.26 cm in diameter.

## 2019-02-05 NOTE — H&P (View-Only) (Signed)
REASON FOR VISIT:    To discuss left lower extremity bypass.  ASSESSMENT & PLAN:   THROMBOSED LEFT POPLITEAL ARTERY ANEURYSM: The patient appears to be a good candidate for a left superficial femoral artery to peroneal artery bypass with a vein graft.  The vein becomes small in the mid calf however I think if we take the vein from the saphenofemoral junction down, given that this is a bypass from the mid SFA we should have enough vein of good quality.  I have discussed the indications for the procedure and the potential complications, including, but not limited to bleeding, wound healing problems, infection, and graft thrombosis.  I explained that we will need to follow his graft closely.  With respect to his right popliteal artery aneurysm this is only slightly dilated and I suspect we will follow this with duplex and only consider repair if it enlarges.  All of his questions were answered and he is agreeable to proceed.  Her surgery has been scheduled for 02/21/2019.  He is on aspirin and is on a statin.  [name], MD, FACS Beeper [phone] Office: [phone]   HPI:   Bryan Rivers is a pleasant 78 y.o. male, who I saw in consultation on 01/28/2019 with a thrombosed left popliteal artery aneurysm.  2 weeks prior to that he developed a gradual onset of pain in his left calf that he noticed when playing golf.  He noted that the foot was slightly cooler when he was lying flat at night he had developed some paresthesias in the left foot.  He was sent for a DVT study and an incidental finding was a thrombosed left popliteal artery aneurysm.  On exam I was able to obtain a dorsalis pedis signal with the Doppler only.  This was dampened and monophasic.  I set him up for an arteriogram given the ischemia to his left lower extremity secondary to the thrombosed popliteal artery aneurysm.  He underwent an arteriogram on 01/31/2019.  On the left side, which is the symptomatic side, he had occlusion of  the superficial femoral artery above the adductor canal.  There was reconstitution of the peroneal artery in the distal anterior tibial artery on the left.  On the right side there was some mild dilatation of the popliteal artery behind the knee which was slightly enlarged compared to his native vessels which are quite large.  Below that the anterior tibial and posterior tibial arteries were occluded.  There is single-vessel runoff on the right via the peroneal artery.  Since I saw him last, he continues to have some occasional paresthesias in the left foot.  He denies any significant rest pain.  He has claudication on the left.  This involves his calf only.  Past Medical History:  Diagnosis Date  . Hypertension   . Morbid obesity ([place]) 02/11/2013  . Prostate cancer [facility])     Family History  Problem Relation Age of Onset  . Heart disease Mother   . Diabetes Mother     SOCIAL HISTORY: Social History   Socioeconomic History  . Marital status: Married    Spouse name: Not on file  . Number of children: Not on file  . Years of education: Not on file  . Highest education level: Not on file  Occupational History  . Not on file  Social Needs  . Financial resource strain: Not on file  . Food insecurity:    Worry: Not on file    Inability: Not on  file  . Transportation needs:    Medical: Not on file    Non-medical: Not on file  Tobacco Use  . Smoking status: Never Smoker  . Smokeless tobacco: Never Used  Substance and Sexual Activity  . Alcohol use: No    Alcohol/week: 0.0 standard drinks  . Drug use: No  . Sexual activity: Not on file  Lifestyle  . Physical activity:    Days per week: Not on file    Minutes per session: Not on file  . Stress: Not on file  Relationships  . Social connections:    Talks on phone: Not on file    Gets together: Not on file    Attends religious service: Not on file    Active member of club or organization: Not on file    Attends meetings of  clubs or organizations: Not on file    Relationship status: Not on file  . Intimate partner violence:    Fear of current or ex partner: Not on file    Emotionally abused: Not on file    Physically abused: Not on file    Forced sexual activity: Not on file  Other Topics Concern  . Not on file  Social History Narrative   Married 19 years   1 daughter age 75   Retired    No Known Allergies  Current Outpatient Medications  Medication Sig Dispense Refill  . olmesartan-hydrochlorothiazide (BENICAR HCT) 40-25 MG tablet Take 1 tablet by mouth daily. (Patient taking differently: Take 1 tablet by mouth every evening. ) 30 tablet 3  . Polyethyl Glycol-Propyl Glycol (LUBRICANT EYE DROPS) 0.4-0.3 % SOLN Place 1 drop into both eyes 3 (three) times daily as needed (dry/irritated eyes.).     No current facility-administered medications for this visit.     REVIEW OF SYSTEMS:  [X]  denotes positive finding, [ ]  denotes negative finding Cardiac  Comments:  Chest pain or chest pressure:    Shortness of breath upon exertion:    Short of breath when lying flat:    Irregular heart rhythm:        Vascular    Pain in calf, thigh, or hip brought on by ambulation:    Pain in feet at night that wakes you up from your sleep:     Blood clot in your veins:    Leg swelling:         Pulmonary    Oxygen at home:    Productive cough:     Wheezing:         Neurologic    Sudden weakness in arms or legs:     Sudden numbness in arms or legs:     Sudden onset of difficulty speaking or slurred speech:    Temporary loss of vision in one eye:     Problems with dizziness:         Gastrointestinal    Blood in stool:     Vomited blood:         Genitourinary    Burning when urinating:     Blood in urine:        Psychiatric    Major depression:         Hematologic    Bleeding problems:    Problems with blood clotting too easily:        Skin    Rashes or ulcers:        Constitutional    Fever or  chills:     PHYSICAL  EXAM:   Vitals:   02/05/19 1350  BP: 127/81  Pulse: 68  Resp: 16  Temp: 98.4 F (36.9 C)  TempSrc: Oral  SpO2: 97%  Weight: 225 lb (102.1 kg)  Height: 5\' 11"  (1.803 m)   GENERAL: The patient is a well-nourished male, in no acute distress. The vital signs are documented above. CARDIAC: There is a regular rate and rhythm.  VASCULAR: I do not detect carotid bruits. On the left side he has a palpable femoral pulse.  He has a peroneal and anterior tibial signal with the Doppler on the left.  There is no posterior tibial signal. On the right side he has a palpable femoral and popliteal pulse.  He has an anterior tibial, dorsalis pedis, peroneal, and posterior tibial signal on the right. PULMONARY: There is good air exchange bilaterally without wheezing or rales. ABDOMEN: Soft and non-tender with normal pitched bowel sounds.  MUSCULOSKELETAL: There are no major deformities or cyanosis. NEUROLOGIC: No focal weakness or paresthesias are detected. SKIN: There are no ulcers or rashes noted. PSYCHIATRIC: The patient has a normal affect.  DATA:    ARTERIOGRAM: The results of his arteriogram are described above.  DUPLEX ABDOMINAL AORTA: I reviewed the duplex of his abdominal aorta that was done on 02/03/2019.  This shows that the maximum diameter of his infrarenal aorta is 2.6 cm.  The maximum diameter of the right common iliac artery is 1.4 cm.  The maximum diameter of the left common iliac artery is 1.7 cm.  SAPHENOUS VEIN MAP: I have reviewed the results of his vein map that was done on 02/03/2019.  This shows that the vein appears to be usable although it does get small in the mid calf where it is 0.26 cm in diameter.

## 2019-02-06 ENCOUNTER — Encounter: Payer: Self-pay | Admitting: Family Medicine

## 2019-02-07 ENCOUNTER — Other Ambulatory Visit: Payer: Self-pay | Admitting: Family Medicine

## 2019-02-07 MED ORDER — ATORVASTATIN CALCIUM 40 MG PO TABS: 40.0000 mg | ORAL_TABLET | Freq: Every day | ORAL | 3 refills | Status: DC

## 2019-02-10 ENCOUNTER — Other Ambulatory Visit: Payer: Self-pay | Admitting: Family Medicine

## 2019-02-10 MED ORDER — ATORVASTATIN CALCIUM 40 MG PO TABS: 40.0000 mg | ORAL_TABLET | Freq: Every day | ORAL | 3 refills | Status: DC

## 2019-02-10 MED ORDER — ROSUVASTATIN CALCIUM 20 MG PO TABS: 20.0000 mg | ORAL_TABLET | Freq: Every day | ORAL | 3 refills | Status: DC

## 2019-02-13 NOTE — Pre-Procedure Instructions (Signed)
Bryan Rivers  02/13/2019      [facility] [place], [address] [place] [address] Phone: [phone] Fax: [phone]    Your procedure is scheduled on Friday March 27th.  Report to [facility] Admitting at 10:15 A.M.  Call this number if you have problems the morning of surgery:  [phone]   Remember:  Do not eat or drink after midnight.     Take these medicines the morning of surgery with A SIP OF WATER -NONE  7 days prior to surgery STOP taking any Aspirin(unless otherwise instructed by your surgeon), diclofenac (VOLTAREN), Aleve, Naproxen, Ibuprofen, Motrin, Advil, Goody's, BC's, all herbal medications, fish oil, and all vitamins     Do not wear jewelry  Do not wear lotions, powders, or colognes, or deodorant.  Men may shave face and neck.  Do not bring valuables to the hospital.  [facility] is not responsible for any belongings or valuables.  Contacts, dentures or bridgework may not be worn into surgery.  Leave your suitcase in the car.  After surgery it may be brought to your room.  For patients admitted to the hospital, discharge time will be determined by your treatment team.  Patients discharged the day of surgery will not be allowed to drive home.   [place]- Preparing For Surgery  Before surgery, you can play an important role. Because skin is not sterile, your skin needs to be as free of germs as possible. You can reduce the number of germs on your skin by washing with CHG (chlorahexidine gluconate) Soap before surgery.  CHG is an antiseptic cleaner which kills germs and bonds with the skin to continue killing germs even after washing.    Oral Hygiene is also important to reduce your risk of infection.  Remember - BRUSH YOUR TEETH THE MORNING OF SURGERY WITH YOUR REGULAR TOOTHPASTE  Please do not use if you have an allergy to CHG or antibacterial soaps. If your skin becomes  reddened/irritated stop using the CHG.  Do not shave (including legs and underarms) for at least 48 hours prior to first CHG shower. It is OK to shave your face.  Please follow these instructions carefully.   1. Shower the NIGHT BEFORE SURGERY and the MORNING OF SURGERY with CHG.   2. If you chose to wash your hair, wash your hair first as usual with your normal shampoo.  3. After you shampoo, rinse your hair and body thoroughly to remove the shampoo.  4. Use CHG as you would any other liquid soap. You can apply CHG directly to the skin and wash gently with a scrungie or a clean washcloth.   5. Apply the CHG Soap to your body ONLY FROM THE NECK DOWN.  Do not use on open wounds or open sores. Avoid contact with your eyes, ears, mouth and genitals (private parts). Wash Face and genitals (private parts)  with your normal soap.  6. Wash thoroughly, paying special attention to the area where your surgery will be performed.  7. Thoroughly rinse your body with warm water from the neck down.  8. DO NOT shower/wash with your normal soap after using and rinsing off the CHG Soap.  9. Pat yourself dry with a CLEAN TOWEL.  10. Wear CLEAN PAJAMAS to bed the night before surgery, wear comfortable clothes the morning of surgery  11. Place CLEAN SHEETS on your bed the  night of your first shower and DO NOT SLEEP WITH PETS.    Day of Surgery: Shower as stated above. Do not apply any deodorants/lotions.  Please wear clean clothes to the hospital/surgery center.   Remember to brush your teeth WITH YOUR REGULAR TOOTHPASTE.

## 2019-02-14 ENCOUNTER — Other Ambulatory Visit: Payer: Self-pay

## 2019-02-14 ENCOUNTER — Encounter (HOSPITAL_COMMUNITY): Payer: Self-pay

## 2019-02-14 ENCOUNTER — Encounter (HOSPITAL_COMMUNITY)
Admission: RE | Admit: 2019-02-14 | Discharge: 2019-02-14 | Disposition: A | Discharge status: Home / Self Care | Payer: Medicare Other | Source: Ambulatory Visit | Attending: Vascular Surgery | Admitting: Vascular Surgery

## 2019-02-14 DIAGNOSIS — Z01812 Encounter for preprocedural laboratory examination: Secondary | ICD-10-CM | POA: Diagnosis not present

## 2019-02-14 LAB — SURGICAL PCR SCREEN
MRSA, PCR: NEGATIVE
Staphylococcus aureus: NEGATIVE

## 2019-02-14 LAB — URINALYSIS, ROUTINE W REFLEX MICROSCOPIC
Bilirubin Urine: NEGATIVE
Glucose, UA: NEGATIVE mg/dL
Hgb urine dipstick: NEGATIVE
Ketones, ur: NEGATIVE mg/dL
Leukocytes,Ua: NEGATIVE
Nitrite: NEGATIVE
Protein, ur: NEGATIVE mg/dL
Specific Gravity, Urine: 1.018 (ref 1.005–1.030)
pH: 6 (ref 5.0–8.0)

## 2019-02-14 LAB — CBC
HCT: 41.5 % (ref 39.0–52.0)
Hemoglobin: 13.4 g/dL (ref 13.0–17.0)
MCH: 28.2 pg (ref 26.0–34.0)
MCHC: 32.3 g/dL (ref 30.0–36.0)
MCV: 87.2 fL (ref 80.0–100.0)
PLATELETS: 317 10*3/uL (ref 150–400)
RBC: 4.76 MIL/uL (ref 4.22–5.81)
RDW: 12.1 % (ref 11.5–15.5)
WBC: 6.8 10*3/uL (ref 4.0–10.5)
nRBC: 0 % (ref 0.0–0.2)

## 2019-02-14 LAB — COMPREHENSIVE METABOLIC PANEL
ALT: 96 U/L — ABNORMAL HIGH (ref 0–44)
AST: 46 U/L — ABNORMAL HIGH (ref 15–41)
Albumin: 3.5 g/dL (ref 3.5–5.0)
Alkaline Phosphatase: 71 U/L (ref 38–126)
Anion gap: 10 (ref 5–15)
BUN: 28 mg/dL — ABNORMAL HIGH (ref 8–23)
CHLORIDE: 101 mmol/L (ref 98–111)
CO2: 27 mmol/L (ref 22–32)
Calcium: 9.7 mg/dL (ref 8.9–10.3)
Creatinine, Ser: 1.15 mg/dL (ref 0.61–1.24)
GFR calc Af Amer: >60 mL/min (ref >60)
GFR calc non Af Amer: >60 mL/min (ref >60)
Glucose, Bld: 93 mg/dL (ref 70–99)
Potassium: 4.7 mmol/L (ref 3.5–5.1)
Sodium: 138 mmol/L (ref 135–145)
Total Bilirubin: 0.4 mg/dL (ref 0.3–1.2)
Total Protein: 7.2 g/dL (ref 6.5–8.1)

## 2019-02-14 LAB — TYPE AND SCREEN
ABO/RH(D): A POS
Antibody Screen: NEGATIVE

## 2019-02-14 LAB — PROTIME-INR
INR: 1.1 (ref 0.8–1.2)
Prothrombin Time: 14 seconds (ref 11.4–15.2)

## 2019-02-14 LAB — APTT: aPTT: 32 seconds (ref 24–36)

## 2019-02-14 LAB — ABO/RH: ABO/RH(D): A POS

## 2019-02-14 NOTE — Progress Notes (Signed)
PCP - Riki Sheer Cardiologist - denies  Chest x-ray - N/A EKG - 01/31/19 Stress Test - "15-20 years ago in [place]" ECHO - denies Cardiac Cath - denies  Sleep Study - denies  Aspirin Instructions: Patient instructed to hold all Aspirin, NSAID's, herbal medications, fish oil and vitamins 7 days prior to surgery.   Anesthesia review:   Patient denies shortness of breath, fever, cough and chest pain at PAT appointment   Patient verbalized understanding of instructions that were given to them at the PAT appointment. Patient was also instructed that they will need to review over the PAT instructions again at home before surgery.

## 2019-02-21 ENCOUNTER — Inpatient Hospital Stay (HOSPITAL_COMMUNITY): Payer: Medicare Other | Admitting: Registered Nurse

## 2019-02-21 ENCOUNTER — Encounter (HOSPITAL_COMMUNITY): Payer: Self-pay

## 2019-02-21 ENCOUNTER — Inpatient Hospital Stay (HOSPITAL_COMMUNITY): Payer: Medicare Other

## 2019-02-21 ENCOUNTER — Inpatient Hospital Stay (HOSPITAL_COMMUNITY)
Admission: RE | Admit: 2019-02-21 | Discharge: 2019-02-23 | DRG: 254 | Disposition: A | Discharge status: Home / Self Care | Payer: Medicare Other | Attending: Vascular Surgery | Admitting: Vascular Surgery

## 2019-02-21 ENCOUNTER — Other Ambulatory Visit: Payer: Self-pay

## 2019-02-21 ENCOUNTER — Encounter (HOSPITAL_COMMUNITY)
Admission: RE | Disposition: A | Discharge status: Home / Self Care | Payer: Self-pay | Source: Home / Self Care | Attending: Vascular Surgery

## 2019-02-21 ENCOUNTER — Encounter (HOSPITAL_COMMUNITY): Payer: Medicare Other

## 2019-02-21 DIAGNOSIS — I724 Aneurysm of artery of lower extremity: Principal | ICD-10-CM | POA: Diagnosis present

## 2019-02-21 DIAGNOSIS — M1990 Unspecified osteoarthritis, unspecified site: Secondary | ICD-10-CM | POA: Diagnosis present

## 2019-02-21 DIAGNOSIS — Z833 Family history of diabetes mellitus: Secondary | ICD-10-CM | POA: Diagnosis not present

## 2019-02-21 DIAGNOSIS — Z8249 Family history of ischemic heart disease and other diseases of the circulatory system: Secondary | ICD-10-CM

## 2019-02-21 DIAGNOSIS — Z7982 Long term (current) use of aspirin: Secondary | ICD-10-CM | POA: Diagnosis not present

## 2019-02-21 DIAGNOSIS — Z8546 Personal history of malignant neoplasm of prostate: Secondary | ICD-10-CM

## 2019-02-21 DIAGNOSIS — Z79899 Other long term (current) drug therapy: Secondary | ICD-10-CM | POA: Diagnosis not present

## 2019-02-21 DIAGNOSIS — Z419 Encounter for procedure for purposes other than remedying health state, unspecified: Secondary | ICD-10-CM

## 2019-02-21 DIAGNOSIS — I743 Embolism and thrombosis of arteries of the lower extremities: Secondary | ICD-10-CM

## 2019-02-21 DIAGNOSIS — I739 Peripheral vascular disease, unspecified: Secondary | ICD-10-CM | POA: Diagnosis present

## 2019-02-21 DIAGNOSIS — I998 Other disorder of circulatory system: Secondary | ICD-10-CM | POA: Diagnosis present

## 2019-02-21 DIAGNOSIS — I10 Essential (primary) hypertension: Secondary | ICD-10-CM | POA: Diagnosis present

## 2019-02-21 HISTORY — PX: BYPASS GRAFT FEMORAL-PERONEAL: SHX5762

## 2019-02-21 LAB — CBC
HCT: 36.5 % — ABNORMAL LOW (ref 39.0–52.0)
Hemoglobin: 12.4 g/dL — ABNORMAL LOW (ref 13.0–17.0)
MCH: 28.4 pg (ref 26.0–34.0)
MCHC: 34 g/dL (ref 30.0–36.0)
MCV: 83.7 fL (ref 80.0–100.0)
Platelets: 286 10*3/uL (ref 150–400)
RBC: 4.36 MIL/uL (ref 4.22–5.81)
RDW: 12.2 % (ref 11.5–15.5)
WBC: 10.8 10*3/uL — ABNORMAL HIGH (ref 4.0–10.5)
nRBC: 0 % (ref 0.0–0.2)

## 2019-02-21 LAB — CREATININE, SERUM
Creatinine, Ser: 1.02 mg/dL (ref 0.61–1.24)
GFR calc Af Amer: >60 mL/min (ref >60)
GFR calc non Af Amer: >60 mL/min (ref >60)

## 2019-02-21 SURGERY — CREATION, BYPASS, ARTERIAL, FEMORAL TO PERONEAL, USING GRAFT
Anesthesia: General | Site: Leg Upper | Laterality: Left

## 2019-02-21 MED ORDER — LABETALOL HCL 5 MG/ML IV SOLN: 10.0000 mg | INTRAVENOUS | Status: DC | PRN

## 2019-02-21 MED ORDER — PAPAVERINE HCL 30 MG/ML IJ SOLN
INTRAMUSCULAR | Status: DC | PRN
  Administered 2019-02-21: 60 mg via INTRAVENOUS

## 2019-02-21 MED ORDER — SODIUM CHLORIDE 0.9 % IV SOLN: INTRAVENOUS | Status: DC

## 2019-02-21 MED ORDER — MORPHINE SULFATE (PF) 2 MG/ML IV SOLN: 2.0000 mg | INTRAVENOUS | Status: DC | PRN

## 2019-02-21 MED ORDER — SODIUM CHLORIDE 0.9 % IV SOLN
INTRAVENOUS | Status: DC | PRN
  Administered 2019-02-21: 20 ug/min via INTRAVENOUS

## 2019-02-21 MED ORDER — FENTANYL CITRATE (PF) 250 MCG/5ML IJ SOLN
INTRAMUSCULAR | Status: AC
  Filled 2019-02-21: qty 5

## 2019-02-21 MED ORDER — CHLORHEXIDINE GLUCONATE CLOTH 2 % EX PADS: 6.0000 | MEDICATED_PAD | Freq: Once | CUTANEOUS | Status: DC

## 2019-02-21 MED ORDER — POLYETHYLENE GLYCOL 3350 17 G PO PACK: 17.0000 g | PACK | Freq: Every day | ORAL | Status: DC | PRN

## 2019-02-21 MED ORDER — HYDROCHLOROTHIAZIDE 25 MG PO TABS
25.0000 mg | ORAL_TABLET | Freq: Every day | ORAL | Status: DC
  Administered 2019-02-22 – 2019-02-23 (×2): 25 mg via ORAL
  Filled 2019-02-21 (×2): qty 1

## 2019-02-21 MED ORDER — SODIUM CHLORIDE 0.9 % IV SOLN: 500.0000 mL | Freq: Once | INTRAVENOUS | Status: DC | PRN

## 2019-02-21 MED ORDER — ONDANSETRON HCL 4 MG/2ML IJ SOLN: 4.0000 mg | Freq: Four times a day (QID) | INTRAMUSCULAR | Status: DC | PRN

## 2019-02-21 MED ORDER — METOPROLOL TARTRATE 5 MG/5ML IV SOLN: 2.0000 mg | INTRAVENOUS | Status: DC | PRN

## 2019-02-21 MED ORDER — FENTANYL CITRATE (PF) 250 MCG/5ML IJ SOLN
INTRAMUSCULAR | Status: DC | PRN
  Administered 2019-02-21 (×2): 50 ug via INTRAVENOUS
  Administered 2019-02-21 (×5): 25 ug via INTRAVENOUS
  Administered 2019-02-21 (×3): 50 ug via INTRAVENOUS
  Administered 2019-02-21: 25 ug via INTRAVENOUS
  Administered 2019-02-21 (×5): 50 ug via INTRAVENOUS

## 2019-02-21 MED ORDER — PROPOFOL 10 MG/ML IV BOLUS
INTRAVENOUS | Status: AC
  Filled 2019-02-21: qty 20

## 2019-02-21 MED ORDER — ROCURONIUM BROMIDE 50 MG/5ML IV SOSY
PREFILLED_SYRINGE | INTRAVENOUS | Status: AC
  Filled 2019-02-21: qty 5

## 2019-02-21 MED ORDER — SODIUM CHLORIDE 0.9 % IV SOLN
INTRAVENOUS | Status: AC
  Filled 2019-02-21: qty 1.2

## 2019-02-21 MED ORDER — OXYCODONE HCL 5 MG/5ML PO SOLN: 5.0000 mg | Freq: Once | ORAL | Status: DC | PRN

## 2019-02-21 MED ORDER — LACTATED RINGERS IV SOLN
INTRAVENOUS | Status: DC | PRN
  Administered 2019-02-21: 07:00:00 via INTRAVENOUS

## 2019-02-21 MED ORDER — PROTAMINE SULFATE 10 MG/ML IV SOLN
INTRAVENOUS | Status: AC
  Filled 2019-02-21: qty 5

## 2019-02-21 MED ORDER — ONDANSETRON HCL 4 MG/2ML IJ SOLN
INTRAMUSCULAR | Status: DC | PRN
  Administered 2019-02-21: 4 mg via INTRAVENOUS

## 2019-02-21 MED ORDER — EPHEDRINE SULFATE-NACL 50-0.9 MG/10ML-% IV SOSY
PREFILLED_SYRINGE | INTRAVENOUS | Status: DC | PRN
  Administered 2019-02-21 (×4): 5 mg via INTRAVENOUS
  Administered 2019-02-21: 10 mg via INTRAVENOUS

## 2019-02-21 MED ORDER — HEPARIN SODIUM (PORCINE) 1000 UNIT/ML IJ SOLN
INTRAMUSCULAR | Status: DC | PRN
  Administered 2019-02-21: 10000 [IU] via INTRAVENOUS

## 2019-02-21 MED ORDER — LACTATED RINGERS IV SOLN
INTRAVENOUS | Status: DC | PRN
  Administered 2019-02-21 (×3): via INTRAVENOUS

## 2019-02-21 MED ORDER — CEFAZOLIN SODIUM 1 G IJ SOLR
INTRAMUSCULAR | Status: AC
  Filled 2019-02-21: qty 40

## 2019-02-21 MED ORDER — ENOXAPARIN SODIUM 40 MG/0.4ML ~~LOC~~ SOLN
40.0000 mg | SUBCUTANEOUS | Status: DC
  Administered 2019-02-22 – 2019-02-23 (×2): 40 mg via SUBCUTANEOUS
  Filled 2019-02-21 (×2): qty 0.4

## 2019-02-21 MED ORDER — GUAIFENESIN-DM 100-10 MG/5ML PO SYRP: 15.0000 mL | ORAL_SOLUTION | ORAL | Status: DC | PRN

## 2019-02-21 MED ORDER — ALUM & MAG HYDROXIDE-SIMETH 200-200-20 MG/5ML PO SUSP: 15.0000 mL | ORAL | Status: DC | PRN

## 2019-02-21 MED ORDER — OLMESARTAN MEDOXOMIL-HCTZ 40-25 MG PO TABS: 1.0000 | ORAL_TABLET | Freq: Every evening | ORAL | Status: DC

## 2019-02-21 MED ORDER — OXYCODONE-ACETAMINOPHEN 5-325 MG PO TABS
1.0000 | ORAL_TABLET | ORAL | Status: DC | PRN
  Administered 2019-02-21 – 2019-02-23 (×4): 2 via ORAL
  Filled 2019-02-21 (×4): qty 2

## 2019-02-21 MED ORDER — SUCCINYLCHOLINE CHLORIDE 200 MG/10ML IV SOSY
PREFILLED_SYRINGE | INTRAVENOUS | Status: DC | PRN
  Administered 2019-02-21: 120 mg via INTRAVENOUS

## 2019-02-21 MED ORDER — PAPAVERINE HCL 30 MG/ML IJ SOLN
INTRAMUSCULAR | Status: AC
  Filled 2019-02-21: qty 2

## 2019-02-21 MED ORDER — ACETAMINOPHEN 325 MG RE SUPP: 325.0000 mg | RECTAL | Status: DC | PRN

## 2019-02-21 MED ORDER — MAGNESIUM SULFATE 2 GM/50ML IV SOLN: 2.0000 g | Freq: Every day | INTRAVENOUS | Status: DC | PRN

## 2019-02-21 MED ORDER — IRBESARTAN 300 MG PO TABS
300.0000 mg | ORAL_TABLET | Freq: Every day | ORAL | Status: DC
  Administered 2019-02-22 – 2019-02-23 (×2): 300 mg via ORAL
  Filled 2019-02-21 (×2): qty 1

## 2019-02-21 MED ORDER — PANTOPRAZOLE SODIUM 40 MG PO TBEC
40.0000 mg | DELAYED_RELEASE_TABLET | Freq: Every day | ORAL | Status: DC
  Filled 2019-02-21 (×2): qty 1

## 2019-02-21 MED ORDER — ROCURONIUM BROMIDE 50 MG/5ML IV SOSY
PREFILLED_SYRINGE | INTRAVENOUS | Status: AC
  Filled 2019-02-21: qty 10

## 2019-02-21 MED ORDER — PROTAMINE SULFATE 10 MG/ML IV SOLN
INTRAVENOUS | Status: DC | PRN
  Administered 2019-02-21 (×2): 20 mg via INTRAVENOUS

## 2019-02-21 MED ORDER — PROPOFOL 10 MG/ML IV BOLUS
INTRAVENOUS | Status: DC | PRN
  Administered 2019-02-21: 150 mg via INTRAVENOUS

## 2019-02-21 MED ORDER — ROCURONIUM BROMIDE 10 MG/ML (PF) SYRINGE
PREFILLED_SYRINGE | INTRAVENOUS | Status: DC | PRN
  Administered 2019-02-21: 30 mg via INTRAVENOUS
  Administered 2019-02-21: 50 mg via INTRAVENOUS
  Administered 2019-02-21 (×2): 20 mg via INTRAVENOUS

## 2019-02-21 MED ORDER — DOCUSATE SODIUM 100 MG PO CAPS
100.0000 mg | ORAL_CAPSULE | Freq: Every day | ORAL | Status: DC
  Administered 2019-02-22 – 2019-02-23 (×2): 100 mg via ORAL
  Filled 2019-02-21 (×2): qty 1

## 2019-02-21 MED ORDER — SODIUM CHLORIDE 0.9 % IV SOLN
INTRAVENOUS | Status: DC | PRN
  Administered 2019-02-21: 500 mL

## 2019-02-21 MED ORDER — SUGAMMADEX SODIUM 200 MG/2ML IV SOLN
INTRAVENOUS | Status: DC | PRN
  Administered 2019-02-21 (×2): 50 mg via INTRAVENOUS
  Administered 2019-02-21: 100 mg via INTRAVENOUS

## 2019-02-21 MED ORDER — CEFAZOLIN SODIUM-DEXTROSE 2-4 GM/100ML-% IV SOLN
2.0000 g | Freq: Three times a day (TID) | INTRAVENOUS | Status: AC
  Administered 2019-02-21 – 2019-02-22 (×2): 2 g via INTRAVENOUS
  Filled 2019-02-21 (×2): qty 100

## 2019-02-21 MED ORDER — PHENOL 1.4 % MT LIQD: 1.0000 | OROMUCOSAL | Status: DC | PRN

## 2019-02-21 MED ORDER — OXYCODONE HCL 5 MG PO TABS: 5.0000 mg | ORAL_TABLET | Freq: Once | ORAL | Status: DC | PRN

## 2019-02-21 MED ORDER — IODIXANOL 320 MG/ML IV SOLN
INTRAVENOUS | Status: DC | PRN
  Administered 2019-02-21: 50 mL

## 2019-02-21 MED ORDER — DEXAMETHASONE SODIUM PHOSPHATE 10 MG/ML IJ SOLN
INTRAMUSCULAR | Status: DC | PRN
  Administered 2019-02-21: 10 mg via INTRAVENOUS

## 2019-02-21 MED ORDER — ACETAMINOPHEN 325 MG PO TABS: 325.0000 mg | ORAL_TABLET | ORAL | Status: DC | PRN

## 2019-02-21 MED ORDER — 0.9 % SODIUM CHLORIDE (POUR BTL) OPTIME
TOPICAL | Status: DC | PRN
  Administered 2019-02-21: 1000 mL

## 2019-02-21 MED ORDER — FENTANYL CITRATE (PF) 100 MCG/2ML IJ SOLN: 25.0000 ug | INTRAMUSCULAR | Status: DC | PRN

## 2019-02-21 MED ORDER — POTASSIUM CHLORIDE CRYS ER 20 MEQ PO TBCR: 20.0000 meq | EXTENDED_RELEASE_TABLET | Freq: Every day | ORAL | Status: DC | PRN

## 2019-02-21 MED ORDER — CEFAZOLIN SODIUM-DEXTROSE 2-4 GM/100ML-% IV SOLN
2.0000 g | INTRAVENOUS | Status: AC
  Administered 2019-02-21 (×2): 2 g via INTRAVENOUS
  Filled 2019-02-21: qty 100

## 2019-02-21 MED ORDER — HYDRALAZINE HCL 20 MG/ML IJ SOLN: 5.0000 mg | INTRAMUSCULAR | Status: DC | PRN

## 2019-02-21 MED ORDER — LIDOCAINE 2% (20 MG/ML) 5 ML SYRINGE
INTRAMUSCULAR | Status: DC | PRN
  Administered 2019-02-21: 60 mg via INTRAVENOUS

## 2019-02-21 MED ORDER — ROSUVASTATIN CALCIUM 20 MG PO TABS
20.0000 mg | ORAL_TABLET | Freq: Every day | ORAL | Status: DC
  Administered 2019-02-22: 20 mg via ORAL
  Filled 2019-02-21 (×2): qty 1

## 2019-02-21 SURGICAL SUPPLY — 57 items
BANDAGE ELASTIC 4 VELCRO ST LF (GAUZE/BANDAGES/DRESSINGS) ×3 IMPLANT
BANDAGE ESMARK 6X9 LF (GAUZE/BANDAGES/DRESSINGS) ×1 IMPLANT
BNDG ESMARK 6X9 LF (GAUZE/BANDAGES/DRESSINGS) ×3
CANISTER SUCT 3000ML PPV (MISCELLANEOUS) ×3 IMPLANT
CANNULA VESSEL 3MM 2 BLNT TIP (CANNULA) ×6 IMPLANT
CLIP VESOCCLUDE MED 24/CT (CLIP) ×3 IMPLANT
CLIP VESOCCLUDE SM WIDE 24/CT (CLIP) ×6 IMPLANT
CLIP VESOCCLUDE SM WIDE 6/CT (CLIP) ×9 IMPLANT
COVER WAND RF STERILE (DRAPES) IMPLANT
CUFF TOURN SGL QUICK 34 (TOURNIQUET CUFF) ×2
CUFF TRNQT CYL 34X4.125X (TOURNIQUET CUFF) ×1 IMPLANT
DERMABOND ADVANCED (GAUZE/BANDAGES/DRESSINGS) ×8
DERMABOND ADVANCED .7 DNX12 (GAUZE/BANDAGES/DRESSINGS) ×4 IMPLANT
DRAIN CHANNEL 15F RND FF W/TCR (WOUND CARE) IMPLANT
DRAPE HALF SHEET 40X57 (DRAPES) IMPLANT
DRAPE X-RAY CASS 24X20 (DRAPES) ×3 IMPLANT
ELECT REM PT RETURN 9FT ADLT (ELECTROSURGICAL) ×3
ELECTRODE REM PT RTRN 9FT ADLT (ELECTROSURGICAL) ×1 IMPLANT
EVACUATOR SILICONE 100CC (DRAIN) IMPLANT
GAUZE 4X4 16PLY RFD (DISPOSABLE) ×3 IMPLANT
GAUZE SPONGE 4X4 12PLY STRL (GAUZE/BANDAGES/DRESSINGS) ×3 IMPLANT
GLOVE BIO SURGEON STRL SZ 6.5 (GLOVE) ×4 IMPLANT
GLOVE BIO SURGEON STRL SZ7.5 (GLOVE) ×6 IMPLANT
GLOVE BIO SURGEONS STRL SZ 6.5 (GLOVE) ×2
GLOVE BIOGEL PI IND STRL 6.5 (GLOVE) ×2 IMPLANT
GLOVE BIOGEL PI IND STRL 8 (GLOVE) ×1 IMPLANT
GLOVE BIOGEL PI INDICATOR 6.5 (GLOVE) ×4
GLOVE BIOGEL PI INDICATOR 8 (GLOVE) ×2
GOWN STRL REUS W/ TWL LRG LVL3 (GOWN DISPOSABLE) ×3 IMPLANT
GOWN STRL REUS W/TWL LRG LVL3 (GOWN DISPOSABLE) ×6
KIT BASIN OR (CUSTOM PROCEDURE TRAY) ×3 IMPLANT
KIT TURNOVER KIT B (KITS) ×3 IMPLANT
MARKER GRAFT CORONARY BYPASS (MISCELLANEOUS) ×3 IMPLANT
NS IRRIG 1000ML POUR BTL (IV SOLUTION) ×6 IMPLANT
PACK PERIPHERAL VASCULAR (CUSTOM PROCEDURE TRAY) ×3 IMPLANT
PAD ARMBOARD 7.5X6 YLW CONV (MISCELLANEOUS) ×6 IMPLANT
SET COLLECT BLD 21X3/4 12 (NEEDLE) ×3 IMPLANT
SPONGE LAP 18X18 RF (DISPOSABLE) ×3 IMPLANT
SPONGE SURGIFOAM ABS GEL 100 (HEMOSTASIS) IMPLANT
STOPCOCK 4 WAY LG BORE MALE ST (IV SETS) ×3 IMPLANT
SUT PROLENE 5 0 C 1 24 (SUTURE) ×3 IMPLANT
SUT PROLENE 6 0 BV (SUTURE) ×12 IMPLANT
SUT PROLENE 7 0 BV 1 (SUTURE) ×3 IMPLANT
SUT SILK 2 0 SH (SUTURE) ×3 IMPLANT
SUT SILK 3 0 (SUTURE) ×6
SUT SILK 3-0 18XBRD TIE 12 (SUTURE) ×3 IMPLANT
SUT VIC AB 2-0 CTB1 (SUTURE) ×6 IMPLANT
SUT VIC AB 3-0 SH 27 (SUTURE) ×10
SUT VIC AB 3-0 SH 27X BRD (SUTURE) ×5 IMPLANT
SUT VICRYL 4-0 PS2 18IN ABS (SUTURE) ×12 IMPLANT
SYRINGE 20CC LL (MISCELLANEOUS) ×3 IMPLANT
TAPE UMBILICAL COTTON 1/8X30 (MISCELLANEOUS) ×3 IMPLANT
TOWEL GREEN STERILE (TOWEL DISPOSABLE) ×6 IMPLANT
TRAY FOLEY MTR SLVR 16FR STAT (SET/KITS/TRAYS/PACK) ×3 IMPLANT
TUBING EXTENTION W/L.L. (IV SETS) ×3 IMPLANT
UNDERPAD 30X30 (UNDERPADS AND DIAPERS) ×3 IMPLANT
WATER STERILE IRR 1000ML POUR (IV SOLUTION) ×3 IMPLANT

## 2019-02-21 NOTE — Progress Notes (Signed)
Notified wife Elfe of PACY status and transfer to 4E room 21 [phone]

## 2019-02-21 NOTE — Anesthesia Procedure Notes (Addendum)
Procedure Name: Intubation Date/Time: 02/21/2019 7:44 AM Performed by: Jearld Pies, CRNA Pre-anesthesia Checklist: Patient identified, Emergency Drugs available, Suction available and Patient being monitored Patient Re-evaluated:Patient Re-evaluated prior to induction Oxygen Delivery Method: Circle System Utilized Preoxygenation: Pre-oxygenation with 100% oxygen Induction Type: IV induction, Rapid sequence and Cricoid Pressure applied Laryngoscope Size: Mac and 4 Grade View: Grade I Tube type: Oral Tube size: 7.5 mm Number of attempts: 1 Airway Equipment and Method: Stylet and Oral airway Placement Confirmation: ETT inserted through vocal cords under direct vision,  positive ETCO2 and breath sounds checked- equal and bilateral Secured at: 22 cm Tube secured with: Tape Dental Injury: Teeth and Oropharynx as per pre-operative assessment

## 2019-02-21 NOTE — Op Note (Signed)
NAME: Tobyn Osgood    MRN: [id number] DOB: 01-26-1941    DATE OF OPERATION: 02/21/2019  PREOP DIAGNOSIS:    Critical limb ischemia left lower extremity secondary to thrombosed left popliteal artery aneurysm  POSTOP DIAGNOSIS:    Same  PROCEDURE:    1.  Left superficial femoral artery to peroneal artery bypass with non-reversed translocated saphenous vein graft 2.  Ligation of the left superficial femoral artery distal to the proximal anastomosis of the bypass graft 3.  Intraoperative arteriogram  SURGEON: Judeth Cornfield. Scot Dock, MD, FACS  ASSIST: Arlee Muslim, PA  ANESTHESIA: General  EBL: 100 cc  INDICATIONS:    Antario Yasuda is a 78 y.o. 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  He had single-vessel runoff via the peroneal artery.  He had rest pain and critical limb ischemia and femoral peroneal bypass was felt to be his best option for limb salvage.  FINDINGS:   The saphenous vein was reasonable in size for the 4-1/2 mm.  The peroneal artery was small.  Intraoperative arteriogram showed no technical problems.  TECHNIQUE:   The patient was taken to the operating room and received a general anesthetic.  The entire left lower extremity was prepped and draped in usual sterile fashion.  An oblique incision was made just below the inguinal crease and here the saphenous vein was dissected free up to the saphenofemoral junction.  Branches were divided between clips and 3-0 silk ties.  Using 3 additional incisions along the medial aspect of the left thigh and proximal calf the great saphenous vein was harvested to the mid calf.  Again branches were divided between clips and 3-0 silk ties.  Through the distal thigh incision I expose the superficial femoral artery above the area of occlusion.  I did minimal dissection knowing that I planned on using the tourniquet.  There was a good pulse at this level.  To the distal incision  the soleus muscle was divided and the dissection carried down to the posterior tibial bundle which was retracted posteriorly.  This allowed access to the peroneal artery which was covered in a plexus of veins which I ligated to expose the peroneal artery.  This was calcified and small.  The saphenous vein was ligated distally and then irrigated up with heparinized saline.  The smaller segment of the vein was small but I thought I had enough vein to excise this segment.  Patient was heparinized.  Tourniquet was placed on the upper thigh.  The leg was exsanguinated with an Esmarch bandage and the tourniquet inflated to 300 mmHg.  Under tourniquet control a longitudinal arteriotomy was made in the superficial femoral artery in the mid to distal thigh.  Given the size mismatch I did not think an end to end anastomosis would be advisable.  The vein was spatulated and sewn in a non-reversed fashion into side to the artery using continuous 5-0 Prolene suture.  The tourniquet was then released and there was an excellent pulse in the proximal vein.  I then ligated the superficial femoral artery distal to the anastomosis with two 2-0 silk ties.  I then used a retrograde Mills valvulotome to lyse the valves in the saphenous vein.  This was passed twice until there was no further patent valves.  There was excellent flow through the vein which was then marked to prevent twisting.  Was flushed with heparinized saline and clipped.  He was then brought in a  superficial tunnel for anastomosis to the peroneal artery.  The tourniquet had been released and this was now reinflated for the distal anastomosis.  A longitudinal arteriotomy was made in the peroneal artery.  The vein was spatulated and cut the appropriate length and sewn end-to-side to the peroneal artery using continuous 6-0 Prolene suture.  Prior to completing this anastomosis the artery was backbled and flushed after the tourniquet was released.  The anastomosis was  completed.  Flow was reestablished to the peroneal artery and there was an excellent peroneal signal at the completion.  An intraoperative arteriogram was then obtained by cannulating the graft around the level of the knee.  This showed no technical problems with single-vessel runoff via a small peroneal artery.  Hemostasis was obtained in the wounds and the heparin was partially reversed with protamine.  The groin incision in the thigh incision was closed with a deep layer of 2-0 Vicryl, a subcutaneous layer with 3-0 Vicryl and the skin closed with 4-0 Vicryl.  The distal incisions were closed with a deep layer of 3-0 Vicryl and the skin closed with 4-0 Vicryl.  At the completion was an anterior tibial and peroneal signal with the Doppler.  Patient tolerated the procedure well was transferred to recovery room in stable condition.  All needle and sponge counts were correct.  [name], MD, FACS [facility]  DATE OF DICTATION:   02/21/2019

## 2019-02-21 NOTE — Anesthesia Preprocedure Evaluation (Signed)
Anesthesia Evaluation  Patient identified by MRN, date of birth, ID band Patient awake    Reviewed: Allergy & Precautions, H&P , NPO status , Patient's Chart, lab work & pertinent test results  Airway Mallampati: II   Neck ROM: full    Dental   Pulmonary neg pulmonary ROS,    breath sounds clear to auscultation       Cardiovascular hypertension, + Peripheral Vascular Disease   Rhythm:regular Rate:Normal     Neuro/Psych    GI/Hepatic   Endo/Other    Renal/GU      Musculoskeletal  (+) Arthritis ,   Abdominal   Peds  Hematology   Anesthesia Other Findings   Reproductive/Obstetrics                             Anesthesia Physical Anesthesia Plan  ASA: III  Anesthesia Plan: General   Post-op Pain Management:    Induction: Intravenous  PONV Risk Score and Plan: 2 and Ondansetron, Dexamethasone and Treatment may vary due to age or medical condition  Airway Management Planned: Oral ETT  Additional Equipment:   Intra-op Plan:   Post-operative Plan: Extubation in OR  Informed Consent: I have reviewed the patients History and Physical, chart, labs and discussed the procedure including the risks, benefits and alternatives for the proposed anesthesia with the patient or authorized representative who has indicated his/her understanding and acceptance.       Plan Discussed with: CRNA, Anesthesiologist and Surgeon  Anesthesia Plan Comments:         Anesthesia Quick Evaluation

## 2019-02-21 NOTE — Transfer of Care (Signed)
Immediate Anesthesia Transfer of Care Note  Patient: Bryan Rivers  Procedure(s) Performed: BYPASS GRAFT FEMORAL-PERONEAL WITH VEIN GRAFT (Left Leg Upper)  Patient Location: PACU  Anesthesia Type:General  Level of Consciousness: drowsy  Airway & Oxygen Therapy: Patient Spontanous Breathing and Patient connected to face mask oxygen  Post-op Assessment: Report given to RN and Post -op Vital signs reviewed and stable  Post vital signs: Reviewed and stable  Last Vitals:  Vitals Value Taken Time  BP 141/63 02/21/2019 12:46 PM  Temp    Pulse 78 02/21/2019 12:47 PM  Resp 11 02/21/2019 12:47 PM  SpO2 94 % 02/21/2019 12:47 PM  Vitals shown include unvalidated device data.  Last Pain:  Vitals:   02/21/19 1246  TempSrc:   PainSc: (P) 0-No pain         Complications: No apparent anesthesia complications

## 2019-02-21 NOTE — Interval H&P Note (Signed)
History and Physical Interval Note:  02/21/2019 7:04 AM  Bryan Rivers  has presented today for surgery, with the diagnosis of CLAUDICATION.  The various methods of treatment have been discussed with the patient and family. After consideration of risks, benefits and other options for treatment, the patient has consented to  Procedure(s): BYPASS GRAFT FEMORAL-PERONEAL WITH VEIN GRAFT (Left) as a surgical intervention.  The patient's history has been reviewed, patient examined, no change in status, stable for surgery.  I have reviewed the patient's chart and labs.  Questions were answered to the patient's satisfaction.     [name]

## 2019-02-22 LAB — CBC
HCT: 35.1 % — ABNORMAL LOW (ref 39.0–52.0)
Hemoglobin: 11.4 g/dL — ABNORMAL LOW (ref 13.0–17.0)
MCH: 27.5 pg (ref 26.0–34.0)
MCHC: 32.5 g/dL (ref 30.0–36.0)
MCV: 84.6 fL (ref 80.0–100.0)
Platelets: 275 10*3/uL (ref 150–400)
RBC: 4.15 MIL/uL — ABNORMAL LOW (ref 4.22–5.81)
RDW: 12.1 % (ref 11.5–15.5)
WBC: 11.3 10*3/uL — ABNORMAL HIGH (ref 4.0–10.5)
nRBC: 0 % (ref 0.0–0.2)

## 2019-02-22 LAB — BASIC METABOLIC PANEL
Anion gap: 9 (ref 5–15)
BUN: 13 mg/dL (ref 8–23)
CO2: 26 mmol/L (ref 22–32)
CREATININE: 0.86 mg/dL (ref 0.61–1.24)
Calcium: 8.8 mg/dL — ABNORMAL LOW (ref 8.9–10.3)
Chloride: 101 mmol/L (ref 98–111)
GFR calc Af Amer: >60 mL/min (ref >60)
GFR calc non Af Amer: >60 mL/min (ref >60)
Glucose, Bld: 130 mg/dL — ABNORMAL HIGH (ref 70–99)
Potassium: 4.2 mmol/L (ref 3.5–5.1)
Sodium: 136 mmol/L (ref 135–145)

## 2019-02-22 MED ORDER — ASPIRIN EC 81 MG PO TBEC
81.0000 mg | DELAYED_RELEASE_TABLET | Freq: Every day | ORAL | Status: DC
  Administered 2019-02-22 – 2019-02-23 (×2): 81 mg via ORAL
  Filled 2019-02-22 (×2): qty 1

## 2019-02-22 NOTE — Progress Notes (Signed)
   VASCULAR SURGERY ASSESSMENT & PLAN:   POD 1 FEMORAL PERONEAL BYPASS: The patient has brisk Doppler signals in the left foot and the peroneal, dorsalis pedis, and posterior tibial positions.  He is on a statin and I have added aspirin.  DVT PROPHYLAXIS: He is on Lovenox.  Anticipate discharge tomorrow depending upon how he does with ambulating today.   SUBJECTIVE:   No complaints.  PHYSICAL EXAM:   Vitals:   02/21/19 1342 02/21/19 1400 02/21/19 2036 02/22/19 0458  BP:  (!) 143/80 124/80 140/71  Pulse:  82 88 67  Resp:   17 15  Temp: 97.7 F (36.5 C) 97.8 F (36.6 C) 97.6 F (36.4 C) 97.7 F (36.5 C)  TempSrc:  Oral Oral Oral  SpO2:  91% 96% 97%  Weight:      Height:       Brisk Doppler signals in the dorsalis pedis, posterior tibial, and peroneal positions left foot. His incisions look fine.  LABS:   Lab Results  Component Value Date   WBC 11.3 (H) 02/22/2019   HGB 11.4 (L) 02/22/2019   HCT 35.1 (L) 02/22/2019   MCV 84.6 02/22/2019   PLT 275 02/22/2019   Lab Results  Component Value Date   CREATININE 0.86 02/22/2019   PROBLEM LIST:    Active Problems:   PAD (peripheral artery disease) (HCC)  CURRENT MEDS:   . docusate sodium  100 mg Oral Daily  . enoxaparin (LOVENOX) injection  40 mg Subcutaneous Q24H  . irbesartan  300 mg Oral Daily   And  . hydrochlorothiazide  25 mg Oral Daily  . pantoprazole  40 mg Oral Daily  . rosuvastatin  20 mg Oral q1800   [place]: [phone] Office: [phone] 02/22/2019

## 2019-02-22 NOTE — Anesthesia Postprocedure Evaluation (Signed)
Anesthesia Post Note  Patient: Bryan Rivers  Procedure(s) Performed: BYPASS GRAFT FEMORAL-PERONEAL WITH VEIN GRAFT (Left Leg Upper)     Patient location during evaluation: PACU Anesthesia Type: General Level of consciousness: awake and alert Pain management: pain level controlled Vital Signs Assessment: post-procedure vital signs reviewed and stable Respiratory status: spontaneous breathing, nonlabored ventilation, respiratory function stable and patient connected to nasal cannula oxygen Cardiovascular status: blood pressure returned to baseline and stable Postop Assessment: no apparent nausea or vomiting Anesthetic complications: no    Last Vitals:  Vitals:   02/22/19 0458 02/22/19 0955  BP: 140/71 (!) 152/78  Pulse: 67 77  Resp: 15 18  Temp: 36.5 C 37.1 C  SpO2: 97% 98%    Last Pain:  Vitals:   02/22/19 0955  TempSrc: Oral  PainSc:                  Anaija Wissink S

## 2019-02-22 NOTE — Progress Notes (Signed)
Physical Therapy Treatment Patient Details Name: Bryan Rivers MRN: [id number] DOB: [birth year] Today's Date: 02/22/2019    History of Present Illness Pt is a 78 y.o. M with significant PMH of chronic left shoulder pain who presents with thrombosed left popliteal artery aneurysm now s/p left superficial femoral artery to peronal artery bypass graft.    PT Comments    Pt admitted with above. On PT evaluation, pt reports good pain control and is eager to participate in therapy. Pt presents with decreased functional mobility secondary to gait abnormalities. Ambulating 350 feet without physical assistance or assistive device. Educated pt on generalized walking program and activity progression. Will follow for gait training.    Follow Up Recommendations  No PT follow up     Equipment Recommendations  None recommended by PT    Recommendations for Other Services       Precautions / Restrictions Precautions Precautions: None Restrictions Weight Bearing Restrictions: No    Mobility  Bed Mobility Overal bed mobility: Modified Independent             General bed mobility comments: Use of bed rail   Transfers Overall transfer level: Modified independent Equipment used: None                Ambulation/Gait Ambulation/Gait assistance: Modified independent (Device/Increase time) Gait Distance (Feet): 350 Feet Assistive device: None Gait Pattern/deviations: Step-to pattern;Step-through pattern;Decreased step length - right;Antalgic;Decreased weight shift to left Gait velocity: decreased Gait velocity interpretation: 1.31 - 2.62 ft/sec, indicative of limited community ambulator General Gait Details: Pt modI with ambulation, with noted gait abnormalities. Cues for pointing left toe forward as pt tends to externally rotate for compensation. Progressing to step through pattern towards end of ambulation   Stairs             Wheelchair Mobility    Modified Rankin  (Stroke Patients Only)       Balance Overall balance assessment: No apparent balance deficits (not formally assessed)                                          Cognition Arousal/Alertness: Awake/alert Behavior During Therapy: WFL for tasks assessed/performed Overall Cognitive Status: Within Functional Limits for tasks assessed                                 General Comments: Very pleasant      Exercises      General Comments  VSS      Pertinent Vitals/Pain Pain Assessment: Faces Faces Pain Scale: Hurts a little bit Pain Location: left leg Pain Descriptors / Indicators: Operative site guarding Pain Intervention(s): Monitored during session    Home Living Family/patient expects to be discharged to:: Private residence Living Arrangements: Spouse/significant other Available Help at Discharge: Family Type of Home: House Home Access: Stairs to enter   Home Layout: One level Home Equipment: Environmental consultant - 4 wheels      Prior Function Level of Independence: Independent      Comments: Works as Chief Financial Officer, enjoys golf   PT Goals (current goals can now be found in the care plan section) Acute Rehab PT Goals Patient Stated Goal: "feel good." PT Goal Formulation: With patient Time For Goal Achievement: 02/27/19 Potential to Achieve Goals: Good    Frequency    Min 3X/week  PT Plan      Co-evaluation              AM-PAC PT "6 Clicks" Mobility   Outcome Measure  Help needed turning from your back to your side while in a flat bed without using bedrails?: None Help needed moving from lying on your back to sitting on the side of a flat bed without using bedrails?: None Help needed moving to and from a bed to a chair (including a wheelchair)?: None Help needed standing up from a chair using your arms (e.g., wheelchair or bedside chair)?: None Help needed to walk in hospital room?: None Help needed climbing 3-5 steps with a  railing? : A Little 6 Click Score: 23    End of Session   Activity Tolerance: Patient tolerated treatment well Patient left: in chair;with call bell/phone within reach   PT Visit Diagnosis: Other abnormalities of gait and mobility (R26.89);Difficulty in walking, not elsewhere classified (R26.2)     Time: 4967-5916 PT Time Calculation (min) (ACUTE ONLY): 26 min  Charges:  $Gait Training: 8-22 mins                     Ellamae Sia, [place], DPT Acute Rehabilitation Services Pager [phone] Office [phone]    Willy Eddy 02/22/2019, 8:56 AM

## 2019-02-22 NOTE — Evaluation (Signed)
Occupational Therapy Evaluation Patient Details Name: Bryan Rivers MRN: [id number] DOB: Apr 10, 1941 Today's Date: 02/22/2019    History of Present Illness 78 y.o. M with significant PMH of chronic left shoulder pain who presents with thrombosed left popliteal artery aneurysm now s/p left superficial femoral artery to peronal artery bypass graft.   Clinical Impression   PTA, pt was living with her wife and was independent. Currently, pt performing ADLs and functional mobility at supervision-Mod I level. Provided education on LB ADLs, toileting, and shower transfer; pt demonstrated understanding. Answered all pt questions. Recommend dc home once medically stable per physician. All acute OT needs met and will sign off. Thank you.     Follow Up Recommendations  No OT follow up    Equipment Recommendations  None recommended by OT    Recommendations for Other Services PT consult     Precautions / Restrictions Precautions Precautions: None Restrictions Weight Bearing Restrictions: No      Mobility Bed Mobility Overal bed mobility: Modified Independent             General bed mobility comments: Use of bed rail   Transfers Overall transfer level: Modified independent Equipment used: None                  Balance Overall balance assessment: No apparent balance deficits (not formally assessed)                                         ADL either performed or assessed with clinical judgement   ADL Overall ADL's : Needs assistance/impaired                                       General ADL Comments: Pt performing ADLs and functional mobility at supervision level. Providing education on safe LB dressing techniques and shower transfer. Pt verbalized and demonstrated understanding.      Vision Baseline Vision/History: Wears glasses Wears Glasses: At all times Patient Visual Report: No change from baseline       Perception      Praxis      Pertinent Vitals/Pain Pain Assessment: Faces Faces Pain Scale: Hurts a little bit Pain Location: left leg Pain Descriptors / Indicators: Operative site guarding Pain Intervention(s): Monitored during session;Limited activity within patient's tolerance;Repositioned     Hand Dominance Right   Extremity/Trunk Assessment Upper Extremity Assessment Upper Extremity Assessment: Overall WFL for tasks assessed   Lower Extremity Assessment Lower Extremity Assessment: LLE deficits/detail LLE Deficits / Details: Expected post op deficits   Cervical / Trunk Assessment Cervical / Trunk Assessment: Normal   Communication Communication Communication: No difficulties   Cognition Arousal/Alertness: Awake/alert Behavior During Therapy: WFL for tasks assessed/performed Overall Cognitive Status: Within Functional Limits for tasks assessed                                 General Comments: Very pleasant   General Comments  VSS    Exercises     Shoulder Instructions      Home Living Family/patient expects to be discharged to:: Private residence Living Arrangements: Spouse/significant other Available Help at Discharge: Family Type of Home: House Home Access: Stairs to enter Technical brewer of Steps: 1(threshold)   Home Layout: One level  Bathroom Shower/Tub: Occupational psychologist: Handicapped height     Home Equipment: Environmental consultant - 4 wheels          Prior Functioning/Environment Level of Independence: Independent        Comments: Works as Chief Financial Officer, enjoys Geophysicist/field seismologist Problem List: Decreased range of motion;Impaired balance (sitting and/or standing);Decreased knowledge of use of DME or AE;Pain      OT Treatment/Interventions:      OT Goals(Current goals can be found in the care plan section) Acute Rehab OT Goals Patient Stated Goal: "feel good." OT Goal Formulation: All assessment and education complete, DC therapy   OT Frequency:     Barriers to D/C:            Co-evaluation              AM-PAC OT "6 Clicks" Daily Activity     Outcome Measure Help from another person eating meals?: None Help from another person taking care of personal grooming?: None Help from another person toileting, which includes using toliet, bedpan, or urinal?: None Help from another person bathing (including washing, rinsing, drying)?: None Help from another person to put on and taking off regular upper body clothing?: None Help from another person to put on and taking off regular lower body clothing?: None 6 Click Score: 24   End of Session Nurse Communication: Mobility status  Activity Tolerance: Patient tolerated treatment well Patient left: in bed;with call bell/phone within reach  OT Visit Diagnosis: Unsteadiness on feet (R26.81);Muscle weakness (generalized) (M62.81);Pain Pain - Right/Left: Left Pain - part of body: Leg                Time: 4650-3546 OT Time Calculation (min): 45 min Charges:  OT General Charges $OT Visit: 1 Visit OT Evaluation $OT Eval Low Complexity: 1 Low OT Treatments $Self Care/Home Management : 23-37 mins  Evah Rashid MSOT, OTR/L Acute Rehab Pager: [phone] Office: Day 02/22/2019, 3:43 PM

## 2019-02-23 ENCOUNTER — Encounter (HOSPITAL_COMMUNITY): Payer: Self-pay | Admitting: Vascular Surgery

## 2019-02-23 MED ORDER — ASPIRIN 81 MG PO TBEC: 81.0000 mg | DELAYED_RELEASE_TABLET | Freq: Every day | ORAL | 1 refills | Status: DC

## 2019-02-23 MED ORDER — OXYCODONE-ACETAMINOPHEN 5-325 MG PO TABS: 1.0000 | ORAL_TABLET | Freq: Four times a day (QID) | ORAL | 0 refills | Status: AC | PRN

## 2019-02-23 NOTE — Discharge Instructions (Signed)
Gradually resume activity.  Elevate leg above heart as much as possible during the day.  The back should be flat, the knee slightly bent in the ankle higher than the knee.  Wash the incisions with soap and water.  It is okay to shower.  My office will call to arrange a follow-up visit in approximately 3 weeks.  Call sooner if any problems. [phone]

## 2019-02-23 NOTE — Progress Notes (Signed)
D/C instructions given to pt. Medications and wound care reviewed. All questions answered. IV removed, clean and intact. Wife to escort pt home.  Kariss Longmire, RN  

## 2019-02-23 NOTE — Discharge Summary (Addendum)
Patient ID: Jorryn Casagrande MRN: [id number] DOB/AGE: 1941/09/15 78 y.o.  Admit date: 02/21/2019 Discharge date: 02/23/2019  Admission Diagnosis: Thrombosed left popliteal artery aneurysm with rest pain  Discharge Diagnoses:  Same  Secondary Diagnoses: Past Medical History:  Diagnosis Date  . Hypertension   . Morbid obesity ([place]) 02/11/2013  . Prostate cancer [facility])    Procedures: 02/21/2019 Surgeon(s): Angelia Mould, MD Procedure(s): BYPASS GRAFT [place] Stall WITH VEIN GRAFT Left  Discharged Condition: good  HPI: I saw this patient in consultation on 01/28/2019 with a thrombosed popliteal artery aneurysm which likely occurred 2 weeks prior to admission.  He underwent preoperative work-up and was felt to be a good candidate for femoral peroneal bypass.  This was his only patent runoff vessel.  Hospital Course: The patient was admitted on 01/31/2019.  He was taken to the operating room and underwent a left femoral to peroneal artery bypass with vein graft from the left leg.  The superficial femoral artery was the site of the proximal anastomosis and the artery was ligated just distal to the proximal anastomosis given his popliteal artery aneurysm.  The patient was admitted to the floor postoperatively and did very well.  By postoperative day #2 he was ambulating without difficulty and was felt to be ready for discharge.  Consults:    Significant Diagnostic Studies:  Dg Ang/ext/uni/or Left  Result Date: 02/21/2019 CLINICAL DATA:  Left SFA to peroneal bypass graft placement secondary to occluded popliteal artery with critical limb ischemia. EXAM: LEFT ANG/EXT/UNI/ OR COMPARISON:  None. FINDINGS: Intraoperative image demonstrates patent venous bypass graft with anastomosis to the peroneal artery at roughly the mid calf level. There is visible runoff into the foot with medial plantar supply and reconstitution of the distal anterior tibial and dorsalis pedis arteries. IMPRESSION:  Patent left lower extremity SFA to peroneal bypass graft with patent runoff into the foot. Electronically Signed   By: Aletta Edouard M.D.   On: 02/21/2019 13:45    CBC    Component Value Date/Time   WBC 11.3 (H) 02/22/2019 0249   RBC 4.15 (L) 02/22/2019 0249   HGB 11.4 (L) 02/22/2019 0249   HCT 35.1 (L) 02/22/2019 0249   PLT 275 02/22/2019 0249   MCV 84.6 02/22/2019 0249   MCH 27.5 02/22/2019 0249   MCHC 32.5 02/22/2019 0249   RDW 12.1 02/22/2019 0249   LYMPHSABS 1.7 01/28/2019 1814   MONOABS 0.9 01/28/2019 1814   EOSABS 0.1 01/28/2019 1814   BASOSABS 0.0 01/28/2019 1814    BMET    Component Value Date/Time   NA 136 02/22/2019 0249   K 4.2 02/22/2019 0249   CL 101 02/22/2019 0249   CO2 26 02/22/2019 0249   GLUCOSE 130 (H) 02/22/2019 0249   BUN 13 02/22/2019 0249   CREATININE 0.86 02/22/2019 0249   CREATININE 0.92 02/05/2013 0832   CALCIUM 8.8 (L) 02/22/2019 0249   GFRNONAA >60 02/22/2019 0249   GFRAA >60 02/22/2019 0249    COAG Lab Results  Component Value Date   INR 1.1 02/14/2019   No results found for: PTT  Disposition: Discharge disposition: 01-Home or Self Care          Signed: [name] 02/23/2019, 8:31 AM

## 2019-02-24 ENCOUNTER — Encounter: Payer: Self-pay | Admitting: Vascular Surgery

## 2019-02-24 ENCOUNTER — Telehealth: Payer: Self-pay

## 2019-02-24 NOTE — Telephone Encounter (Signed)
Called patient to schedule hospital follow up visit. Patient states he will see Vascular surgeon and call PCP if he feels he needs to see him.

## 2019-03-03 ENCOUNTER — Other Ambulatory Visit: Payer: Self-pay | Admitting: *Deleted

## 2019-03-03 MED ORDER — OXYCODONE-ACETAMINOPHEN 5-325 MG PO TABS: 1.0000 | ORAL_TABLET | Freq: Four times a day (QID) | ORAL | 0 refills | Status: AC | PRN

## 2019-03-11 ENCOUNTER — Other Ambulatory Visit: Payer: Self-pay

## 2019-03-11 DIAGNOSIS — I70202 Unspecified atherosclerosis of native arteries of extremities, left leg: Secondary | ICD-10-CM

## 2019-03-11 DIAGNOSIS — I739 Peripheral vascular disease, unspecified: Secondary | ICD-10-CM

## 2019-03-18 ENCOUNTER — Telehealth (HOSPITAL_COMMUNITY): Payer: Self-pay | Admitting: Rehabilitation

## 2019-03-18 NOTE — Telephone Encounter (Signed)
The above patient or their representative was contacted and gave the following answers to these questions:         Do you have any of the following symptoms? No  Fever                    Cough                   Shortness of breath  Do  you have any of the following other symptoms? No   muscle pain         vomiting,        diarrhea        rash         weakness        red eye        abdominal pain         bruising          bruising or bleeding              joint pain           severe headache    Have you been in contact with someone who was or has been sick in the past 2 weeks? No  Yes                 Unsure                         Unable to assess   Does the person that you were in contact with have any of the following symptoms?   Cough         shortness of breath           muscle pain         vomiting,            diarrhea            rash            weakness           fever            red eye           abdominal pain           bruising  or  bleeding                joint pain                severe headache               Have you  or someone you have been in contact with traveled internationally in th last month? No        If yes, which countries?   Have you  or someone you have been in contact with traveled outside [place] in th last month? No         If yes, which state and city?   COMMENTS OR ACTION PLAN FOR THIS PATIENT:          

## 2019-03-19 ENCOUNTER — Ambulatory Visit (HOSPITAL_COMMUNITY)
Admission: RE | Admit: 2019-03-19 | Discharge: 2019-03-19 | Disposition: A | Discharge status: Home / Self Care | Payer: Medicare Other | Source: Ambulatory Visit | Attending: Vascular Surgery | Admitting: Vascular Surgery

## 2019-03-19 ENCOUNTER — Other Ambulatory Visit: Payer: Self-pay

## 2019-03-19 ENCOUNTER — Ambulatory Visit (INDEPENDENT_AMBULATORY_CARE_PROVIDER_SITE_OTHER): Payer: Self-pay | Admitting: Vascular Surgery

## 2019-03-19 ENCOUNTER — Ambulatory Visit (INDEPENDENT_AMBULATORY_CARE_PROVIDER_SITE_OTHER)
Admission: RE | Admit: 2019-03-19 | Discharge: 2019-03-19 | Disposition: A | Discharge status: Home / Self Care | Payer: Medicare Other | Source: Ambulatory Visit | Attending: Vascular Surgery | Admitting: Vascular Surgery

## 2019-03-19 ENCOUNTER — Encounter: Payer: Self-pay | Admitting: Vascular Surgery

## 2019-03-19 VITALS — BP 141/80 | HR 75 | Temp 98.2°F | Resp 20 | Ht 71.0 in | Wt 216.0 lb

## 2019-03-19 DIAGNOSIS — I70202 Unspecified atherosclerosis of native arteries of extremities, left leg: Secondary | ICD-10-CM | POA: Diagnosis present

## 2019-03-19 DIAGNOSIS — I739 Peripheral vascular disease, unspecified: Secondary | ICD-10-CM | POA: Diagnosis present

## 2019-03-19 DIAGNOSIS — Z48812 Encounter for surgical aftercare following surgery on the circulatory system: Secondary | ICD-10-CM

## 2019-03-19 NOTE — Progress Notes (Signed)
Patient name: Bryan Rivers MRN: [id number] DOB: [birth year] Sex: male  REASON FOR VISIT:   Follow-up after left superficial femoral artery to peroneal artery bypass  HPI:   Bryan Rivers is a pleasant 78 y.o. 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  He had single-vessel runoff via the peroneal artery.  He presented with critical limb ischemia.  On 02/21/2019, he underwent a left superficial femoral artery to peroneal artery bypass with a vein graft and ligation of the superficial femoral artery distal to the proximal anastomosis.  Comes in for routine follow-up visit.  Patient is doing excellent and has no specific complaints.  He has been ambulating and doing his daily exercise routine.  Current Outpatient Medications  Medication Sig Dispense Refill  . aspirin EC 81 MG EC tablet Take 1 tablet (81 mg total) by mouth daily. 30 tablet 1  . diclofenac (VOLTAREN) 75 MG EC tablet Take 75 mg by mouth 2 (two) times daily.    . Multiple Vitamins-Minerals (CENTRUM SILVER PO) Take 1 tablet by mouth daily.    Marland Kitchen olmesartan-hydrochlorothiazide (BENICAR HCT) 40-25 MG tablet Take 1 tablet by mouth daily. (Patient taking differently: Take 1 tablet by mouth every evening. ) 30 tablet 3  . Polyethyl Glycol-Propyl Glycol (LUBRICANT EYE DROPS) 0.4-0.3 % SOLN Place 1 drop into both eyes 3 (three) times daily as needed (dry/irritated eyes.).    Marland Kitchen rosuvastatin (CRESTOR) 20 MG tablet Take 1 tablet (20 mg total) by mouth daily. 90 tablet 3   No current facility-administered medications for this visit.     REVIEW OF SYSTEMS:  [X]  denotes positive finding, [ ]  denotes negative finding Vascular    Leg swelling    Cardiac    Chest pain or chest pressure:    Shortness of breath upon exertion:    Short of breath when lying flat:    Irregular heart rhythm:    Constitutional    Fever or chills:     PHYSICAL EXAM:   Vitals:   03/19/19 1314  BP: (!)  141/80  Pulse: 75  Resp: 20  Temp: 98.2 F (36.8 C)  SpO2: 96%  Weight: 216 lb (98 kg)  Height: 5\' 11"  (1.803 m)    GENERAL: The patient is a well-nourished male, in no acute distress. The vital signs are documented above. CARDIOVASCULAR: There is a regular rate and rhythm. PULMONARY: There is good air exchange bilaterally without wheezing or rales. Patient's incisions are all healing nicely.  He has brisk Doppler signals in the left foot.  He has no significant left lower extremity swelling.  DATA:   ARTERIAL DOPPLER STUDY: I have independently interpreted his arterial Doppler study today.  On the left side, which is the size of the bypass, he has a triphasic dorsalis pedis and posterior tibial signal.  ABI is 100%.  Toe pressure is 87 mmHg.  On the right side he has a triphasic dorsalis pedis and posterior tibial signal.  ABIs 95%.  Toe pressure is 86 mmHg.  GRAFT DUPLEX: I have independently interpreted his graft duplex.  His left superficial femoral artery to peroneal artery bypass graft is widely patent with triphasic flow throughout.  There are no areas of stenosis identified.  MEDICAL ISSUES:   STATUS POST LEFT SUPERFICIAL FEMORAL ARTERY TO PERONEAL ARTERY BYPASS: Patient's bypass graft is patent with excellent Doppler flow in the left foot and an ABI of 100%.  He has peroneal runoff only on the left.  I ordered a follow-up graft duplex and ABIs in 6 months and I will see him back at that time.  In addition we will duplex his right popliteal artery as he has some slight ectasia in the right side based on his previous arteriogram.  He is on aspirin and is on a statin.  He is not a smoker.  He knows to call sooner if he has problems.  Bryan Rivers [facility] [phone]

## 2019-03-25 ENCOUNTER — Encounter: Payer: Self-pay | Admitting: Family Medicine

## 2019-03-26 ENCOUNTER — Other Ambulatory Visit: Payer: Self-pay | Admitting: Family Medicine

## 2019-03-26 DIAGNOSIS — E785 Hyperlipidemia, unspecified: Secondary | ICD-10-CM

## 2019-03-27 ENCOUNTER — Other Ambulatory Visit: Payer: Self-pay

## 2019-03-27 ENCOUNTER — Other Ambulatory Visit (INDEPENDENT_AMBULATORY_CARE_PROVIDER_SITE_OTHER): Payer: Medicare Other

## 2019-03-27 ENCOUNTER — Encounter: Payer: Self-pay | Admitting: Family Medicine

## 2019-03-27 DIAGNOSIS — E785 Hyperlipidemia, unspecified: Secondary | ICD-10-CM

## 2019-03-27 LAB — HEPATIC FUNCTION PANEL
ALT: 31 U/L (ref 0–53)
AST: 20 U/L (ref 0–37)
Albumin: 3.9 g/dL (ref 3.5–5.2)
Alkaline Phosphatase: 77 U/L (ref 39–117)
Bilirubin, Direct: 0.1 mg/dL (ref 0.0–0.3)
Total Bilirubin: 0.6 mg/dL (ref 0.2–1.2)
Total Protein: 6.4 g/dL (ref 6.0–8.3)

## 2019-03-27 LAB — LIPID PANEL
Cholesterol: 146 mg/dL (ref 0–200)
HDL: 39.9 mg/dL (ref >39.00)
LDL Cholesterol: 88 mg/dL (ref 0–99)
NonHDL: 106.19
Total CHOL/HDL Ratio: 4
Triglycerides: 93 mg/dL (ref 0.0–149.0)
VLDL: 18.6 mg/dL (ref 0.0–40.0)

## 2019-03-28 ENCOUNTER — Telehealth: Payer: Self-pay | Admitting: Vascular Surgery

## 2019-03-28 MED ORDER — OLMESARTAN MEDOXOMIL-HCTZ 40-25 MG PO TABS: 1.0000 | ORAL_TABLET | Freq: Every evening | ORAL | 1 refills | Status: DC

## 2019-03-28 NOTE — Telephone Encounter (Signed)
Bryan Rivers with Bryan Rivers called and left a voice message saying patient is refusing services due to COVID 19.  He would like to be discharged from [facility] services because "he is fine".  If there are any questions we can call her at [phone].    Thurston Hole., LPN

## 2019-04-24 ENCOUNTER — Encounter: Payer: Self-pay | Admitting: Family Medicine

## 2019-04-25 NOTE — Telephone Encounter (Signed)
AWV scheduled 

## 2019-04-30 NOTE — Progress Notes (Signed)
Virtual Visit via Video Note  I connected with patient on 05/01/19 at 11:00 AM EDT by audio enabled telemedicine application and verified that I am speaking with the correct person using two identifiers.   THIS ENCOUNTER IS A VIRTUAL VISIT DUE TO COVID-19 - PATIENT WAS NOT SEEN IN THE OFFICE. PATIENT HAS CONSENTED TO VIRTUAL VISIT / TELEMEDICINE VISIT   Location of patient: home  Location of provider: office  I discussed the limitations of evaluation and management by telemedicine and the availability of in person appointments. The patient expressed understanding and agreed to proceed.   Subjective:   Bryan Rivers is a 78 y.o. male who presents for an Medicare Annual Wellness Visit.  Enjoys reading.  The Patient was informed that the wellness visit is to identify future health risk and educate and initiate measures that can reduce risk for increased disease through the lifespan.   Review of Systems No ROS.  Medicare Wellness Virtual Visit.  Visual/audio telehealth visit, UTA vital signs.   See social history for additional risk factors. Cardiac Risk Factors include: advanced age (>107men, >16 women);dyslipidemia;hypertension;male gender Sleep patterns: no issues Home Safety/Smoke Alarms: Feels safe in home. Smoke alarms in place.  Lives with wife. 1 story.   Male:   [facility]-  05/27/12. normal PSA-  Lab Results  Component Value Date   PSA 7.29 (H) 02/05/2013   PSA 6.32 (H) 07/02/2012   PSA TEST NOT PERFORMED 02/27/2012      Objective:    Today's Vitals   05/01/19 1117  BP: 140/77  Pulse: 69    Advanced Directives 05/01/2019 03/19/2019 02/22/2019 02/14/2019 02/05/2019 01/31/2019 01/28/2019  Does Patient Have a Medical Advance Directive? Yes No No No No No No  Type of Paramedic of [place];Living will - - - - - -  Does patient want to make changes to medical advance directive? No - Patient declined - - - - - -  Copy of [place] in Chart? No  - copy requested - - - - - -  Would patient like information on creating a medical advance directive? - No - Patient declined No - Patient declined No - Patient declined - Yes (MAU/Ambulatory/Procedural Areas - Information given) No - Patient declined    Current Medications (verified) Outpatient Encounter Medications as of 05/01/2019  Medication Sig  . aspirin EC 81 MG EC tablet Take 1 tablet (81 mg total) by mouth daily.  . diclofenac (VOLTAREN) 75 MG EC tablet Take 75 mg by mouth 2 (two) times daily.  . Multiple Vitamins-Minerals (CENTRUM SILVER PO) Take 1 tablet by mouth daily.  Marland Kitchen olmesartan-hydrochlorothiazide (BENICAR HCT) 40-25 MG tablet Take 1 tablet by mouth every evening.  Vladimir Faster Glycol-Propyl Glycol (LUBRICANT EYE DROPS) 0.4-0.3 % SOLN Place 1 drop into both eyes 3 (three) times daily as needed (dry/irritated eyes.).  Marland Kitchen rosuvastatin (CRESTOR) 20 MG tablet Take 1 tablet (20 mg total) by mouth daily.   No facility-administered encounter medications on file as of 05/01/2019.     Allergies (verified) Patient has no known allergies.   History: Past Medical History:  Diagnosis Date  . Hypertension   . Morbid obesity ([place]) 02/11/2013  . Prostate cancer [facility])    Past Surgical History:  Procedure Laterality Date  . ABDOMINAL AORTOGRAM W/LOWER EXTREMITY Bilateral 01/31/2019   Procedure: ABDOMINAL AORTOGRAM W/LOWER EXTREMITY;  Surgeon: Angelia Mould, MD;  Location: [facility];  Service: Cardiovascular;  Laterality: Bilateral;  . APPENDECTOMY  1950  age 82  . BYPASS GRAFT FEMORAL-PERONEAL Left 02/21/2019   Procedure: BYPASS GRAFT FEMORAL-PERONEAL WITH VEIN GRAFT;  Surgeon: Angelia Mould, MD;  Location: [facility];  Service: Vascular;  Laterality: Left;  . WRIST SURGERY Left 2015   Family History  Problem Relation Age of Onset  . Heart disease Mother   . Diabetes Mother    Social History   Socioeconomic History  . Marital status: Married    Spouse name:  Not on file  . Number of children: Not on file  . Years of education: Not on file  . Highest education level: Not on file  Occupational History  . Not on file  Social Needs  . Financial resource strain: Not on file  . Food insecurity:    Worry: Not on file    Inability: Not on file  . Transportation needs:    Medical: Not on file    Non-medical: Not on file  Tobacco Use  . Smoking status: Never Smoker  . Smokeless tobacco: Never Used  Substance and Sexual Activity  . Alcohol use: No    Alcohol/week: 0.0 standard drinks  . Drug use: No  . Sexual activity: Not on file  Lifestyle  . Physical activity:    Days per week: Not on file    Minutes per session: Not on file  . Stress: Not on file  Relationships  . Social connections:    Talks on phone: Not on file    Gets together: Not on file    Attends religious service: Not on file    Active member of club or organization: Not on file    Attends meetings of clubs or organizations: Not on file    Relationship status: Not on file  Other Topics Concern  . Not on file  Social History Narrative   Married 65 years   1 daughter age 66   Retired   Tobacco Counseling Counseling given: Not Answered   Clinical Intake Pain : No/denies pain    Activities of Daily Living In your present state of health, do you have any difficulty performing the following activities: 05/01/2019 02/22/2019  Hearing? N N  Vision? N N  Comment wears bifocals -  Difficulty concentrating or making decisions? N N  Walking or climbing stairs? N Y  Dressing or bathing? N N  Doing errands, shopping? N N  Preparing Food and eating ? N -  Using the Toilet? N -  In the past six months, have you accidently leaked urine? N -  Do you have problems with loss of bowel control? N -  Managing your Medications? N -  Managing your Finances? N -  Housekeeping or managing your Housekeeping? N -  Some recent data might be hidden     Immunizations and Health  Maintenance Immunization History  Administered Date(s) Administered  . Influenza-Unspecified 08/30/2017  . Pneumococcal Conjugate-13 04/05/2017  . Pneumococcal Polysaccharide-23 01/13/2016, 12/26/2018   There are no preventive care reminders to display for this patient.  Patient Care Team: Shelda Pal, DO as PCP - General (Family Medicine)  Indicate any recent Medical Services you may have received from other than [facility] providers in the past year (date may be approximate).    Assessment:   This is a routine wellness examination for [place]. Physical assessment deferred to PCP.   Hearing/Vision screen Unable to assess. This visit is enabled though telemedicine due to Covid 19.   Dietary issues and exercise activities discussed: Current Exercise Habits:  Home exercise routine, Type of exercise: walking, Time (Minutes): 20, Intensity: Mild, Exercise limited by: None identified Diet (meal preparation, eat out, water intake, caffeinated beverages, dairy products, fruits and vegetables): in general, a "healthy" diet  , well balanced, on average, 3 meals per day     Goals    . Get back to the gym and golf.      Depression Screen PHQ 2/9 Scores 05/01/2019 04/25/2017  PHQ - 2 Score 0 0    Fall Risk Fall Risk  05/01/2019 04/25/2017 12/11/2016  Falls in the past year? 0 No No     Cognitive Function: Ad8 score reviewed for issues:  Issues making decisions:no  Less interest in hobbies / activities:no  Repeats questions, stories (family complaining):no  Trouble using ordinary gadgets (microwave, computer, phone):no  Forgets the month or year: no  Mismanaging finances: no  Remembering appts:no  Daily problems with thinking and/or memory:no Ad8 score is=0         Screening Tests Health Maintenance  Topic Date Due  . TETANUS/TDAP  12/27/2019 (Originally 06/19/1960)  . INFLUENZA VACCINE  06/28/2019  . COLONOSCOPY  05/27/2022  . PNA vac Low Risk Adult  Completed      Plan:   See you next year!  Continue to eat heart healthy diet (full of fruits, vegetables, whole grains, lean protein, water--limit salt, fat, and sugar intake) and increase physical activity as tolerated.  Continue doing brain stimulating activities (puzzles, reading, adult coloring books, staying active) to keep memory sharp.   Bring a copy of your living will and/or healthcare power of attorney to your next office visit.    I have personally reviewed and noted the following in the patient's chart:   . Medical and social history . Use of alcohol, tobacco or illicit drugs  . Current medications and supplements . Functional ability and status . Nutritional status . Physical activity . Advanced directives . List of other physicians . Hospitalizations, surgeries, and ER visits in previous 12 months . Vitals . Screenings to include cognitive, depression, and falls . Referrals and appointments  In addition, I have reviewed and discussed with patient certain preventive protocols, quality metrics, and best practice recommendations. A written personalized care plan for preventive services as well as general preventive health recommendations were provided to patient.     Shela Nevin, [place]   05/01/2019

## 2019-05-01 ENCOUNTER — Ambulatory Visit (INDEPENDENT_AMBULATORY_CARE_PROVIDER_SITE_OTHER): Payer: Medicare Other | Admitting: *Deleted

## 2019-05-01 ENCOUNTER — Encounter: Payer: Self-pay | Admitting: *Deleted

## 2019-05-01 ENCOUNTER — Other Ambulatory Visit: Payer: Self-pay

## 2019-05-01 VITALS — BP 140/77 | HR 69

## 2019-05-01 DIAGNOSIS — Z0000 Encounter for general adult medical examination without abnormal findings: Secondary | ICD-10-CM | POA: Diagnosis not present

## 2019-05-01 NOTE — Patient Instructions (Signed)
See you next year!  Continue to eat heart healthy diet (full of fruits, vegetables, whole grains, lean protein, water--limit salt, fat, and sugar intake) and increase physical activity as tolerated.  Continue doing brain stimulating activities (puzzles, reading, adult coloring books, staying active) to keep memory sharp.   Bring a copy of your living will and/or healthcare power of attorney to your next office visit.   Bryan Rivers , Thank you for taking time to come for your Medicare Wellness Visit. I appreciate your ongoing commitment to your health goals. Please review the following plan we discussed and let me know if I can assist you in the future.   These are the goals we discussed: Goals    . Get back to the gym and golf.       This is a list of the screening recommended for you and due dates:  Health Maintenance  Topic Date Due  . Tetanus Vaccine  12/27/2019*  . Flu Shot  06/28/2019  . Colon Cancer Screening  05/27/2022  . Pneumonia vaccines  Completed  *Topic was postponed. The date shown is not the original due date.     Health Maintenance After Age 66 After age 3, you are at a higher risk for certain long-term diseases and infections as well as injuries from falls. Falls are a major cause of broken bones and head injuries in people who are older than age 82. Getting regular preventive care can help to keep you healthy and well. Preventive care includes getting regular testing and making lifestyle changes as recommended by your health care provider. Talk with your health care provider about:  Which screenings and tests you should have. A screening is a test that checks for a disease when you have no symptoms.  A diet and exercise plan that is right for you. What should I know about screenings and tests to prevent falls? Screening and testing are the best ways to find a health problem early. Early diagnosis and treatment give you the best chance of managing medical  conditions that are common after age 46. Certain conditions and lifestyle choices may make you more likely to have a fall. Your health care provider may recommend:  Regular vision checks. Poor vision and conditions such as cataracts can make you more likely to have a fall. If you wear glasses, make sure to get your prescription updated if your vision changes.  Medicine review. Work with your health care provider to regularly review all of the medicines you are taking, including over-the-counter medicines. Ask your health care provider about any side effects that may make you more likely to have a fall. Tell your health care provider if any medicines that you take make you feel dizzy or sleepy.  Osteoporosis screening. Osteoporosis is a condition that causes the bones to get weaker. This can make the bones weak and cause them to break more easily.  Blood pressure screening. Blood pressure changes and medicines to control blood pressure can make you feel dizzy.  Strength and balance checks. Your health care provider may recommend certain tests to check your strength and balance while standing, walking, or changing positions.  Foot health exam. Foot pain and numbness, as well as not wearing proper footwear, can make you more likely to have a fall.  Depression screening. You may be more likely to have a fall if you have a fear of falling, feel emotionally low, or feel unable to do activities that you used to do.  Alcohol use screening. Using too much alcohol can affect your balance and may make you more likely to have a fall. What actions can I take to lower my risk of falls? General instructions  Talk with your health care provider about your risks for falling. Tell your health care provider if: ? You fall. Be sure to tell your health care provider about all falls, even ones that seem minor. ? You feel dizzy, sleepy, or off-balance.  Take over-the-counter and prescription medicines only as told  by your health care provider. These include any supplements.  Eat a healthy diet and maintain a healthy weight. A healthy diet includes low-fat dairy products, low-fat (lean) meats, and fiber from whole grains, beans, and lots of fruits and vegetables. Home safety  Remove any tripping hazards, such as rugs, cords, and clutter.  Install safety equipment such as grab bars in bathrooms and safety rails on stairs.  Keep rooms and walkways well-lit. Activity   Follow a regular exercise program to stay fit. This will help you maintain your balance. Ask your health care provider what types of exercise are appropriate for you.  If you need a cane or walker, use it as recommended by your health care provider.  Wear supportive shoes that have nonskid soles. Lifestyle  Do not drink alcohol if your health care provider tells you not to drink.  If you drink alcohol, limit how much you have: ? 0-1 drink a day for women. ? 0-2 drinks a day for men.  Be aware of how much alcohol is in your drink. In the [place] one drink equals one typical bottle of beer (12 oz), one-half glass of wine (5 oz), or one shot of hard liquor (1 oz).  Do not use any products that contain nicotine or tobacco, such as cigarettes and e-cigarettes. If you need help quitting, ask your health care provider. Summary  Having a healthy lifestyle and getting preventive care can help to protect your health and wellness after age 76.  Screening and testing are the best way to find a health problem early and help you avoid having a fall. Early diagnosis and treatment give you the best chance for managing medical conditions that are more common for people who are older than age 90.  Falls are a major cause of broken bones and head injuries in people who are older than age 66. Take precautions to prevent a fall at home.  Work with your health care provider to learn what changes you can make to improve your health and wellness and to  prevent falls. This information is not intended to replace advice given to you by your health care provider. Make sure you discuss any questions you have with your health care provider. Document Released: 09/26/2017 Document Revised: 09/26/2017 Document Reviewed: 09/26/2017 Elsevier Interactive Patient Education  2019 [employer].

## 2019-05-02 NOTE — Progress Notes (Signed)
Noted. Agree with above.  [place], DO 05/02/19 7:23 AM

## 2019-07-02 ENCOUNTER — Encounter: Payer: Self-pay | Admitting: Family Medicine

## 2019-07-02 ENCOUNTER — Telehealth: Payer: Self-pay

## 2019-07-02 NOTE — Telephone Encounter (Signed)
Wife called requesting an appt saying he cannot wait until October to be seen because he is having a lot of discomfort and numbness at the area where he had his surgery.   Appt made for pt to be seen in 2 weeks with our PA. Was advised if symptoms get worse to go to the ER.   York Cerise, CMA

## 2019-07-22 ENCOUNTER — Ambulatory Visit (INDEPENDENT_AMBULATORY_CARE_PROVIDER_SITE_OTHER): Payer: Medicare Other | Admitting: Physician Assistant

## 2019-07-22 ENCOUNTER — Other Ambulatory Visit: Payer: Self-pay

## 2019-07-22 VITALS — BP 181/98 | Temp 98.0°F | Resp 14 | Ht 72.0 in | Wt 231.0 lb

## 2019-07-22 DIAGNOSIS — I739 Peripheral vascular disease, unspecified: Secondary | ICD-10-CM

## 2019-07-22 DIAGNOSIS — I70202 Unspecified atherosclerosis of native arteries of extremities, left leg: Secondary | ICD-10-CM

## 2019-07-22 NOTE — Progress Notes (Signed)
    Established Previous Bypass   History of Present Illness   Bryan Rivers is a 78 y.o. (January 13, 1941) male who presents with chief complaint of numbness and occasional pins-and-needles feeling near incisions of left leg status post left superficial femoral artery to peroneal artery bypass with vein by Dr. Scot Dock on 02/21/2019.  He denies any claudication, rest pain, or active tissue ischemia.  Patient also states he has left hip pain and clicking.  Patient states he is nearly "bone on bone" and may need hip replacement however wanted to make sure his hip pain is not related to his bypass surgery.  He is taking an aspirin daily.  He is taking a statin daily.  Current Outpatient Medications  Medication Sig Dispense Refill  . aspirin EC 81 MG EC tablet Take 1 tablet (81 mg total) by mouth daily. 30 tablet 1  . diclofenac (VOLTAREN) 75 MG EC tablet Take 75 mg by mouth 2 (two) times daily.    . Multiple Vitamins-Minerals (CENTRUM SILVER PO) Take 1 tablet by mouth daily.    Marland Kitchen olmesartan-hydrochlorothiazide (BENICAR HCT) 40-25 MG tablet Take 1 tablet by mouth every evening. 90 tablet 1  . Polyethyl Glycol-Propyl Glycol (LUBRICANT EYE DROPS) 0.4-0.3 % SOLN Place 1 drop into both eyes 3 (three) times daily as needed (dry/irritated eyes.).    Marland Kitchen rosuvastatin (CRESTOR) 20 MG tablet Take 1 tablet (20 mg total) by mouth daily. 90 tablet 3   No current facility-administered medications for this visit.     On ROS today: 10 system ROS is negative unless otherwise noted in HPI   Physical Examination   Vitals:   07/22/19 1441  BP: (!) 181/98  Resp: 14  Temp: 98 F (36.7 C)  TempSrc: Temporal  SpO2: 100%  Weight: 231 lb (104.8 kg)  Height: 6' (1.829 m)   Body mass index is 31.33 kg/m.  General Alert, O x 3, WD, NAD  Pulmonary Sym exp, good B air movt  Cardiac RRR, Nl S1, S2  Vascular Vessel Right Left  Radial Palpable Palpable  Popliteal Not palpable Not palpable  PT Not palpable  soft  signal by Doppler  DP Not palpable Brisk by Doppler    Gastro- intestinal soft, non-distended, non-tender to palpation,   Musculo- skeletal M/S 5/5 throughout  , Extremities without ischemic changes  , No edema present, No visible varicosities , No Lipodermatosclerosis present  Neurologic Pain and light touch intact in extremities , Motor exam as listed above     Medical Decision Making   Bryan Rivers is a 78 y.o. male with numbness and pins-and-needles feeling in area of incisions of left lower extremity status post SFA to peroneal bypass with vein 5 months postop   Patent bypass with peripheral Doppler signals intact  Assured patient that numbness around areas of incision and distal to popliteal incision are very common after this type of surgery and is not a cause for concern  Also discussed that pain and clicking in hip associated with walking is likely unrelated to bypass surgery especially if there is documented moderate to severe osteoarthritis of left hip joint  Continue aspirin and statin daily  Follow-up as scheduled with Dr. Scot Dock in October for graft surveillance and ABIs  Dagoberto Ligas PA-C [facility] Office: [phone]  Clinic MD: Dr. Carlis Abbott

## 2019-09-11 ENCOUNTER — Ambulatory Visit: Payer: Medicare Other | Admitting: Vascular Surgery

## 2019-09-15 ENCOUNTER — Other Ambulatory Visit: Payer: Self-pay

## 2019-09-15 DIAGNOSIS — I739 Peripheral vascular disease, unspecified: Secondary | ICD-10-CM

## 2019-09-16 ENCOUNTER — Ambulatory Visit (HOSPITAL_COMMUNITY)
Admission: RE | Admit: 2019-09-16 | Discharge: 2019-09-16 | Disposition: A | Discharge status: Home / Self Care | Payer: Medicare Other | Source: Ambulatory Visit | Attending: Family | Admitting: Family

## 2019-09-16 ENCOUNTER — Other Ambulatory Visit: Payer: Self-pay

## 2019-09-16 ENCOUNTER — Ambulatory Visit (INDEPENDENT_AMBULATORY_CARE_PROVIDER_SITE_OTHER)
Admission: RE | Admit: 2019-09-16 | Discharge: 2019-09-16 | Disposition: A | Discharge status: Home / Self Care | Payer: Medicare Other | Source: Ambulatory Visit | Attending: Family | Admitting: Family

## 2019-09-16 DIAGNOSIS — I739 Peripheral vascular disease, unspecified: Secondary | ICD-10-CM | POA: Insufficient documentation

## 2019-09-17 ENCOUNTER — Ambulatory Visit (INDEPENDENT_AMBULATORY_CARE_PROVIDER_SITE_OTHER): Payer: Medicare Other | Admitting: Vascular Surgery

## 2019-09-17 ENCOUNTER — Other Ambulatory Visit: Payer: Self-pay

## 2019-09-17 ENCOUNTER — Encounter: Payer: Self-pay | Admitting: Vascular Surgery

## 2019-09-17 VITALS — BP 151/79 | HR 71 | Temp 97.6°F | Resp 20 | Ht 72.0 in | Wt 221.0 lb

## 2019-09-17 DIAGNOSIS — I70202 Unspecified atherosclerosis of native arteries of extremities, left leg: Secondary | ICD-10-CM

## 2019-09-17 DIAGNOSIS — Z48812 Encounter for surgical aftercare following surgery on the circulatory system: Secondary | ICD-10-CM | POA: Diagnosis not present

## 2019-09-17 NOTE — Progress Notes (Signed)
Patient name: Bryan Rivers MRN: [id number] DOB: 1941/11/15 Sex: male  REASON FOR VISIT:   Follow-up after repair of left popliteal artery aneurysm  HPI:   Bryan Rivers is a pleasant 77 y.o. male who presented with critical limb ischemia secondary to a thrombosed left popliteal artery aneurysm.  On 02/21/2019 he underwent left superficial femoral artery to peroneal artery bypass with a vein graft and ligation of the left superficial femoral artery distal to the proximal anastomosis of the bypass graft.  I last saw him on 03/19/2019.  At that time graft duplex showed that his bypass graft was patent with triphasic flow throughout and no areas of stenosis noted.  ABI on the left was 100%.  ABI on the right was 95%.  I recommended a follow-up duplex and ABIs in 6 months.  There was some slight ectasia to the right popliteal artery and therefore I also wanted a follow-up duplex of the right popliteal artery.  Of note he was on aspirin and a statin.  He was not a smoker.  Since I saw him last he is having a lot of pain from his left hip which is limiting his activity.  He denies claudication or rest pain.  He has had no symptoms in the right leg.  Past Medical History:  Diagnosis Date  . Hypertension   . Morbid obesity ([place]) 02/11/2013  . Prostate cancer [facility])     Family History  Problem Relation Age of Onset  . Heart disease Mother   . Diabetes Mother     SOCIAL HISTORY: Social History   Tobacco Use  . Smoking status: Never Smoker  . Smokeless tobacco: Never Used  Substance Use Topics  . Alcohol use: No    Alcohol/week: 0.0 standard drinks    No Known Allergies  Current Outpatient Medications  Medication Sig Dispense Refill  . aspirin EC 81 MG EC tablet Take 1 tablet (81 mg total) by mouth daily. 30 tablet 1  . Multiple Vitamins-Minerals (CENTRUM SILVER PO) Take 1 tablet by mouth daily.    Marland Kitchen olmesartan-hydrochlorothiazide (BENICAR HCT) 40-25 MG tablet Take 1 tablet by mouth  every evening. 90 tablet 1  . Polyethyl Glycol-Propyl Glycol (LUBRICANT EYE DROPS) 0.4-0.3 % SOLN Place 1 drop into both eyes 3 (three) times daily as needed (dry/irritated eyes.).    Marland Kitchen predniSONE (DELTASONE) 5 MG tablet prednisone 5 mg tablets in a dose pack  Take as directed on package    . rosuvastatin (CRESTOR) 20 MG tablet Take 1 tablet (20 mg total) by mouth daily. 90 tablet 3  . diclofenac (VOLTAREN) 75 MG EC tablet Take 75 mg by mouth 2 (two) times daily.     No current facility-administered medications for this visit.     REVIEW OF SYSTEMS:  [X]  denotes positive finding, [ ]  denotes negative finding Cardiac  Comments:  Chest pain or chest pressure:    Shortness of breath upon exertion:    Short of breath when lying flat:    Irregular heart rhythm:        Vascular    Pain in calf, thigh, or hip brought on by ambulation:    Pain in feet at night that wakes you up from your sleep:     Blood clot in your veins:    Leg swelling:         Pulmonary    Oxygen at home:    Productive cough:     Wheezing:  Neurologic    Sudden weakness in arms or legs:     Sudden numbness in arms or legs:     Sudden onset of difficulty speaking or slurred speech:    Temporary loss of vision in one eye:     Problems with dizziness:         Gastrointestinal    Blood in stool:     Vomited blood:         Genitourinary    Burning when urinating:     Blood in urine:        Psychiatric    Major depression:         Hematologic    Bleeding problems:    Problems with blood clotting too easily:        Skin    Rashes or ulcers:        Constitutional    Fever or chills:     PHYSICAL EXAM:   Vitals:   09/17/19 0903  BP: (!) 151/79  Pulse: 71  Resp: 20  Temp: 97.6 F (36.4 C)  SpO2: 94%  Weight: 221 lb (100.2 kg)  Height: 6' (1.829 m)    GENERAL: The patient is a well-nourished male, in no acute distress. The vital signs are documented above. CARDIAC: There is a regular  rate and rhythm.  VASCULAR: I do not detect carotid bruits. He has palpable pedal pulses bilaterally. He has mild left lower extremity swelling. PULMONARY: There is good air exchange bilaterally without wheezing or rales. ABDOMEN: Soft and non-tender with normal pitched bowel sounds.  MUSCULOSKELETAL: There are no major deformities or cyanosis. NEUROLOGIC: No focal weakness or paresthesias are detected. SKIN: There are no ulcers or rashes noted. PSYCHIATRIC: The patient has a normal affect.  DATA:    DUPLEX RIGHT LOWER EXTREMITY: I have independently interpreted his duplex of the right popliteal artery.  The maximum diameter is 2.15 cm.  He has large arteries in general based on his arteriogram.   GRAFT DUPLEX: I have independently interpreted his graft duplex.  His left superficial femoral artery to peroneal artery bypass graft is widely patent.  He has biphasic flow throughout.  ARTERIAL DOPPLER STUDY: I have independently interpreted his arterial Doppler study today.   On the right side he has a triphasic dorsalis pedis and posterior tibial signal with an ABI of 100%.  Toe pressure is 86 mmHg.  On the left side he has a triphasic peroneal signal.  ABI is 100%.  Toe pressures 145 mmHg.  MEDICAL ISSUES:   STATUS POST REPAIR OF RIGHT POPLITEAL ARTERY ANEURYSM: His bypass graft is patent with a normal ABI and triphasic signals in the peroneal artery which is his only runoff.  He is having terrible pain with his left hip and is being considered for left hip replacement.  Given that the bypass originates well below the hip I do not see any contraindications to hip surgery.  If his aspirin needs to be stopped prior to surgery certainly this is reasonable and should simply be restarted as soon as possible.  I have ordered a follow-up graft duplex and ABIs in 6 months and I will see him back at that time.  RIGHT POPLITEAL ARTERY ANEURYSM: The patient has a small right popliteal artery aneurysm.   He has ectasia in general on the right based on his arteriogram and given that the artery looks fairly smooth I would not recommend addressing the right popliteal artery aneurysm unless this enlarges significantly.  I ordered  a follow-up duplex scan in 6 months and I will see him back at that time.  VASCULAR QUALITY INITIATIVE: He is on aspirin and is on a statin.   [name] [facility] [phone]

## 2019-09-19 NOTE — Telephone Encounter (Signed)
Patient was given an appt and seen on 09/17/2019 in office.

## 2019-09-22 ENCOUNTER — Encounter: Payer: Self-pay | Admitting: Family Medicine

## 2019-09-23 ENCOUNTER — Ambulatory Visit (INDEPENDENT_AMBULATORY_CARE_PROVIDER_SITE_OTHER): Payer: Medicare Other | Admitting: Family Medicine

## 2019-09-23 ENCOUNTER — Encounter: Payer: Self-pay | Admitting: Family Medicine

## 2019-09-23 ENCOUNTER — Other Ambulatory Visit: Payer: Self-pay

## 2019-09-23 VITALS — BP 138/80 | HR 83 | Temp 97.3°F | Ht 71.0 in | Wt 221.1 lb

## 2019-09-23 DIAGNOSIS — Z01818 Encounter for other preprocedural examination: Secondary | ICD-10-CM

## 2019-09-23 LAB — CBC
HCT: 40.9 % (ref 39.0–52.0)
Hemoglobin: 13.9 g/dL (ref 13.0–17.0)
MCHC: 33.9 g/dL (ref 30.0–36.0)
MCV: 85.1 fl (ref 78.0–100.0)
Platelets: 230 10*3/uL (ref 150.0–400.0)
RBC: 4.81 Mil/uL (ref 4.22–5.81)
RDW: 13.5 % (ref 11.5–15.5)
WBC: 6.7 10*3/uL (ref 4.0–10.5)

## 2019-09-23 LAB — BASIC METABOLIC PANEL
BUN: 19 mg/dL (ref 6–23)
CO2: 30 mEq/L (ref 19–32)
Calcium: 9.8 mg/dL (ref 8.4–10.5)
Chloride: 99 mEq/L (ref 96–112)
Creatinine, Ser: 1.07 mg/dL (ref 0.40–1.50)
GFR: 67.15 mL/min (ref >60.00)
Glucose, Bld: 104 mg/dL — ABNORMAL HIGH (ref 70–99)
Potassium: 4.5 mEq/L (ref 3.5–5.1)
Sodium: 138 mEq/L (ref 135–145)

## 2019-09-23 NOTE — Progress Notes (Signed)
Subjective:   Chief Complaint  Patient presents with  . Medical Clearance    Bryan Rivers  is here for a Pre-operative physical at the request of Dr. Salli Quarry.   He  is having L total hip replacement surgery on 10/02/2019 for L hip OA.  Personal or family hx of adverse outcome to anesthesia? No  Chipped, cracked, missing, or loose teeth? Not in the front; +missingmolars on lower jaw Decreased ROM of neck? No  Able to walk up 2 flights of stairs without becoming significantly short of breath or having chest pain? Yes   Revised Goldman Criteria: High Risk Surgery (intraperitoneal, intrathoracic, aortic): No  Ischemic heart disease (Prior MI, +excercise stress test, angina, nitrate use, Qwave): No  History of heart failure: No  History of cerebrovascular disease: No  History of diabetes: No  Insulin therapy for DM: No  Preoperative Cr >2.0: No    Hx of vascular disease. Recent duplex showed a pop arterial thrombus.   Revised Goldman Criteria - risk for major cardiac death No risk factors - 0.4 percent One risk factor - 1.0 percent  Two risk factors - 2.4 percent  Three or more risk factors - 5.4 percent    Patient Active Problem List   Diagnosis Date Noted  . PAD (peripheral artery disease) ([place]) 02/21/2019  . Arthritis of left hip 01/23/2019  . Chronic left shoulder pain 12/26/2018  . Chronic pain of left knee 12/26/2018  . Lower back injury, initial encounter 01/02/2017  . Left wrist injury 11/09/2015  . Left rib fracture 11/09/2015  . Morbid obesity ([place]) 02/11/2013  . Hyperglycemia 11/04/2012  . Essential hypertension 09/05/2011  . Hyperlipidemia 09/05/2011  . Malignant neoplasm of prostate ([place]) 09/05/2011   Past Medical History:  Diagnosis Date  . Hypertension   . Morbid obesity ([place]) 02/11/2013  . Prostate cancer [facility])     Past Surgical History:  Procedure Laterality Date  . ABDOMINAL AORTOGRAM W/LOWER EXTREMITY Bilateral 01/31/2019   Procedure: ABDOMINAL AORTOGRAM  W/LOWER EXTREMITY;  Surgeon: Angelia Mould, MD;  Location: [place] [facility];  Service: Cardiovascular;  Laterality: Bilateral;  . APPENDECTOMY  1950   age 78  . BYPASS GRAFT FEMORAL-PERONEAL Left 02/21/2019   Procedure: BYPASS GRAFT FEMORAL-PERONEAL WITH VEIN GRAFT;  Surgeon: Angelia Mould, MD;  Location: [facility];  Service: Vascular;  Laterality: Left;  . WRIST SURGERY Left 2015    Current Outpatient Medications  Medication Sig Dispense Refill  . aspirin EC 81 MG EC tablet Take 1 tablet (81 mg total) by mouth daily. 30 tablet 1  . olmesartan-hydrochlorothiazide (BENICAR HCT) 40-25 MG tablet Take 1 tablet by mouth every evening. 90 tablet 1  . Polyethyl Glycol-Propyl Glycol (LUBRICANT EYE DROPS) 0.4-0.3 % SOLN Place 1 drop into both eyes 3 (three) times daily as needed (dry/irritated eyes.).    Marland Kitchen rosuvastatin (CRESTOR) 20 MG tablet Take 1 tablet (20 mg total) by mouth daily. 90 tablet 3   No Known Allergies  Family History  Problem Relation Age of Onset  . Heart disease Mother   . Diabetes Mother      Review of Systems:  Constitutional:  no fevers Eye:  no recent significant change in vision Ear:  no hearing loss Nose/Mouth/Throat:  No dental complaints Neck/Thyroid:  no lumps or masses Pulmonary:  No shortness of breath Cardiovascular:  no chest pain Gastrointestinal:  no abdominal pain GU:  negative for dysuria Musculoskeletal/Extremities:  no pain Skin/Integumentary ROS:  no abnormal skin lesions reported Neurologic:  no HA   Objective:   Vitals:   09/23/19 1054  BP: 138/80  Pulse: 83  Temp: (!) 97.3 F (36.3 C)  TempSrc: Temporal  SpO2: 96%  Weight: 221 lb 2 oz (100.3 kg)  Height: 5\' 11"  (1.803 m)   Body mass index is 30.84 kg/m.  General:  well developed, well nourished, in no apparent distress Skin:  warm, no pallor or diaphoresis Head:  normocephalic, atraumatic Eyes:  pupils equal and round, sclera anicteric without injection Ears:   canals without lesions, TMs shiny without retraction, no obvious effusion, no erythema Throat/Pharynx:  lips and gingiva without lesion; tongue and uvula midline; non-inflamed pharynx; no exudates or postnasal drainage; +missing molars L mandibular row Neck: neck supple without adenopathy, thyromegaly, or masses, no bruits, no jugular venous distention Lungs:  clear to auscultation, breath sounds equal bilaterally, no respiratory distress Cardio:  regular rate and rhythm without murmurs Abdomen:  abdomen soft, nontender; bowel sounds normal; no masses, hepatomegaly or splenomegaly Musculoskeletal:  symmetrical muscle groups noted without atrophy or deformity Extremities:  no clubbing, cyanosis, or edema, no deformities, no skin discoloration Neuro:  gait normal; deep tendon reflexes normal and symmetric and alert and oriented to person, place, and time Psych: Age appropriate judgment and insight; normal mood   Assessment:   Pre-op exam - Plan: EKG 12-Lead, CBC, Basic metabolic panel   Plan:   Orders as above. EKG - normal EKG, normal sinus rhythm, unchanged from previous tracings Low cardiac risk for surgery. Will ck w vascular team regarding arterial thrombus. Rec'd pt sent a message as well.  Pending the above workup, the patient is deemed low cardiac risk for the proposed procedure.  The patient voiced understanding and agreement to the plan.  [place], DO 09/23/19  12:09 PM

## 2019-09-23 NOTE — Patient Instructions (Addendum)
Send your vascular surgeon a message on [place] about if they are concerned about the arterial thrombus causing issues with your surgery. I will be reaching out to them as well.   Give [place] 2-3 business days to get the results of your labs back.   Stop the aspirin 5 days prior to your surgery.  Let us know if you need anything.

## 2019-09-24 ENCOUNTER — Encounter (HOSPITAL_COMMUNITY): Payer: Self-pay

## 2019-09-24 NOTE — Patient Instructions (Addendum)
DUE TO COVID-19 ONLY ONE VISITOR IS ALLOWED TO COME WITH YOU AND STAY IN THE WAITING ROOM ONLY DURING PRE OP AND PROCEDURE. THE ONE VISITOR MAY VISIT WITH YOU IN YOUR PRIVATE ROOM DURING VISITING HOURS ONLY!!   COVID SWAB TESTING MUST BE COMPLETED ON:   Monday, Nov. 2, 2020 at   [address], [place] -Former [facility] enter pre surgical testing line (Must self quarantine after testing. Follow instructions on handout.)        [address] [place], [place] - the short stay covered drive at [facility] (Use the [employer] entrance to [facility] next to [facility].) (Must self quarantine after testing. Follow instructions on handout.)  [place] [facility] [place] (Must self quarantine after testing. Follow instructions on handout.)       Your procedure is scheduled on: Thursday, Nov. 5, 2020   Report to [facility] Main  Entrance   Report to Short Stay at 5:30 AM    Call this number if you have problems the morning of surgery [phone]   Do not eat food  :After Midnight.   May have liquids until 4:15 AM day of surgery   CLEAR LIQUID DIET  Foods Allowed                                                                     Foods Excluded  Water, Black Coffee and tea, regular and decaf                             liquids that you cannot  Plain Jell-O in any flavor  (No red)                                           see through such as: Fruit ices (not with fruit pulp)                                     milk, soups, orange juice  Iced Popsicles (No red)                                    All solid food Carbonated beverages, regular and diet                                    Apple juices Sports drinks like Gatorade (No red) Lightly seasoned clear broth or consume(fat free) Sugar, honey syrup  Sample Menu Breakfast                                Lunch  Supper Cranberry juice                    Beef broth                            Chicken broth Jell-O                                     Grape juice                           Apple juice Coffee or tea                        Jell-O                                      Popsicle                                                Coffee or tea                        Coffee or tea   Complete one Ensure drink the morning of surgery at 4:15 AM the day of surgery.   Brush your teeth the morning of surgery.   Do NOT smoke after Midnight   Take these medicines the morning of surgery with A SIP OF WATER: None                               You may not have any metal on your body including jewelry, and body piercings             Do not wear lotions, powders, perfumes/cologne, or deodorant                           Men may shave face and neck.   Do not bring valuables to the hospital. [place].   Contacts, dentures or bridgework may not be worn into surgery.   Bring small overnight bag day of surgery.    Special Instructions: Bring a copy of your healthcare power of attorney and living will documents         the day of surgery if you haven't scanned them in before.              Please read over the following fact sheets you were given:   [facility] - Preparing for Surgery Before surgery, you can play an important role.  Because skin is not sterile, your skin needs to be as free of germs as possible.  You can reduce the number of germs on your skin by washing with CHG (chlorahexidine gluconate) soap before surgery.  CHG is an antiseptic cleaner which kills germs and bonds with the skin to continue killing germs even after washing. Please DO NOT use if you have an allergy to  CHG or antibacterial soaps.  If your skin becomes reddened/irritated stop using the CHG and inform your nurse when you arrive at Short Stay. Do not shave (including legs  and underarms) for at least 48 hours prior to the first CHG shower.  You may shave your face/neck.  Please follow these instructions carefully:  1.  Shower with CHG Soap the night before surgery and the  morning of surgery.  2.  If you choose to wash your hair, wash your hair first as usual with your normal  shampoo.  3.  After you shampoo, rinse your hair and body thoroughly to remove the shampoo.                             4.  Use CHG as you would any other liquid soap.  You can apply chg directly to the skin and wash.  Gently with a scrungie or clean washcloth.  5.  Apply the CHG Soap to your body ONLY FROM THE NECK DOWN.   Do   not use on face/ open                           Wound or open sores. Avoid contact with eyes, ears mouth and   genitals (private parts).                       Wash face,  Genitals (private parts) with your normal soap.             6.  Wash thoroughly, paying special attention to the area where your    surgery  will be performed.  7.  Thoroughly rinse your body with warm water from the neck down.  8.  DO NOT shower/wash with your normal soap after using and rinsing off the CHG Soap.                9.  Pat yourself dry with a clean towel.            10.  Wear clean pajamas.            11.  Place clean sheets on your bed the night of your first shower and do not  sleep with pets. Day of Surgery : Do not apply any lotions/deodorants the morning of surgery.  Please wear clean clothes to the hospital/surgery center.  FAILURE TO FOLLOW THESE INSTRUCTIONS MAY RESULT IN THE CANCELLATION OF YOUR SURGERY  PATIENT SIGNATURE_________________________________  NURSE SIGNATURE__________________________________  ________________________________________________________________________   Adam Phenix  An incentive spirometer is a tool that can help keep your lungs clear and active. This tool measures how well you are filling your lungs with each breath. Taking long  deep breaths may help reverse or decrease the chance of developing breathing (pulmonary) problems (especially infection) following:  A long period of time when you are unable to move or be active. BEFORE THE PROCEDURE   If the spirometer includes an indicator to show your best effort, your nurse or respiratory therapist will set it to a desired goal.  If possible, sit up straight or lean slightly forward. Try not to slouch.  Hold the incentive spirometer in an upright position. INSTRUCTIONS FOR USE  1. Sit on the edge of your bed if possible, or sit up as far as you can in bed or on a chair. 2. Hold the incentive spirometer in an  upright position. 3. Breathe out normally. 4. Place the mouthpiece in your mouth and seal your lips tightly around it. 5. Breathe in slowly and as deeply as possible, raising the piston or the ball toward the top of the column. 6. Hold your breath for 3-5 seconds or for as long as possible. Allow the piston or ball to fall to the bottom of the column. 7. Remove the mouthpiece from your mouth and breathe out normally. 8. Rest for a few seconds and repeat Steps 1 through 7 at least 10 times every 1-2 hours when you are awake. Take your time and take a few normal breaths between deep breaths. 9. The spirometer may include an indicator to show your best effort. Use the indicator as a goal to work toward during each repetition. 10. After each set of 10 deep breaths, practice coughing to be sure your lungs are clear. If you have an incision (the cut made at the time of surgery), support your incision when coughing by placing a pillow or rolled up towels firmly against it. Once you are able to get out of bed, walk around indoors and cough well. You may stop using the incentive spirometer when instructed by your caregiver.  RISKS AND COMPLICATIONS  Take your time so you do not get dizzy or light-headed.  If you are in pain, you may need to take or ask for pain medication  before doing incentive spirometry. It is harder to take a deep breath if you are having pain. AFTER USE  Rest and breathe slowly and easily.  It can be helpful to keep track of a log of your progress. Your caregiver can provide you with a simple table to help with this. If you are using the spirometer at home, follow these instructions: [place] IF:   You are having difficultly using the spirometer.  You have trouble using the spirometer as often as instructed.  Your pain medication is not giving enough relief while using the spirometer.  You develop fever of 100.5 F (38.1 C) or higher. SEEK IMMEDIATE MEDICAL CARE IF:   You cough up bloody sputum that had not been present before.  You develop fever of 102 F (38.9 C) or greater.  You develop worsening pain at or near the incision site. MAKE SURE YOU:   Understand these instructions.  Will watch your condition.  Will get help right away if you are not doing well or get worse. Document Released: 03/26/2007 Document Revised: 02/05/2012 Document Reviewed: 05/27/2007 ExitCare Patient Information 2014 ExitCare, [place].   ________________________________________________________________________  WHAT IS A BLOOD TRANSFUSION? Blood Transfusion Information  A transfusion is the replacement of blood or some of its parts. Blood is made up of multiple cells which provide different functions.  Red blood cells carry oxygen and are used for blood loss replacement.  White blood cells fight against infection.  Platelets control bleeding.  Plasma helps clot blood.  Other blood products are available for specialized needs, such as hemophilia or other clotting disorders. BEFORE THE TRANSFUSION  Who gives blood for transfusions?   Healthy volunteers who are fully evaluated to make sure their blood is safe. This is blood bank blood. Transfusion therapy is the safest it has ever been in the practice of medicine. Before blood is  taken from a donor, a complete history is taken to make sure that person has no history of diseases nor engages in risky social behavior (examples are intravenous drug use or sexual activity with  multiple partners). The donor's travel history is screened to minimize risk of transmitting infections, such as malaria. The donated blood is tested for signs of infectious diseases, such as HIV and hepatitis. The blood is then tested to be sure it is compatible with you in order to minimize the chance of a transfusion reaction. If you or a relative donates blood, this is often done in anticipation of surgery and is not appropriate for emergency situations. It takes many days to process the donated blood. RISKS AND COMPLICATIONS Although transfusion therapy is very safe and saves many lives, the main dangers of transfusion include:   Getting an infectious disease.  Developing a transfusion reaction. This is an allergic reaction to something in the blood you were given. Every precaution is taken to prevent this. The decision to have a blood transfusion has been considered carefully by your caregiver before blood is given. Blood is not given unless the benefits outweigh the risks. AFTER THE TRANSFUSION  Right after receiving a blood transfusion, you will usually feel much better and more energetic. This is especially true if your red blood cells have gotten low (anemic). The transfusion raises the level of the red blood cells which carry oxygen, and this usually causes an energy increase.  The nurse administering the transfusion will monitor you carefully for complications. HOME CARE INSTRUCTIONS  No special instructions are needed after a transfusion. You may find your energy is better. Speak with your caregiver about any limitations on activity for underlying diseases you may have. SEEK MEDICAL CARE IF:   Your condition is not improving after your transfusion.  You develop redness or irritation at the  intravenous (IV) site. SEEK IMMEDIATE MEDICAL CARE IF:  Any of the following symptoms occur over the next 12 hours:  Shaking chills.  You have a temperature by mouth above 102 F (38.9 C), not controlled by medicine.  Chest, back, or muscle pain.  People around you feel you are not acting correctly or are confused.  Shortness of breath or difficulty breathing.  Dizziness and fainting.  You get a rash or develop hives.  You have a decrease in urine output.  Your urine turns a dark color or changes to pink, red, or brown. Any of the following symptoms occur over the next 10 days:  You have a temperature by mouth above 102 F (38.9 C), not controlled by medicine.  Shortness of breath.  Weakness after normal activity.  The white part of the eye turns yellow (jaundice).  You have a decrease in the amount of urine or are urinating less often.  Your urine turns a dark color or changes to pink, red, or brown. Document Released: 11/10/2000 Document Revised: 02/05/2012 Document Reviewed: 06/29/2008 [facility] Patient Information 2014 [place], [place].  _______________________________________________________________________

## 2019-09-25 ENCOUNTER — Encounter (HOSPITAL_COMMUNITY)
Admission: RE | Admit: 2019-09-25 | Discharge: 2019-09-25 | Disposition: A | Discharge status: Home / Self Care | Payer: Medicare Other | Source: Ambulatory Visit | Attending: Orthopedic Surgery | Admitting: Orthopedic Surgery

## 2019-09-25 ENCOUNTER — Other Ambulatory Visit: Payer: Self-pay

## 2019-09-25 ENCOUNTER — Encounter (HOSPITAL_COMMUNITY): Payer: Self-pay

## 2019-09-25 DIAGNOSIS — Z01812 Encounter for preprocedural laboratory examination: Secondary | ICD-10-CM | POA: Diagnosis present

## 2019-09-25 DIAGNOSIS — Z20828 Contact with and (suspected) exposure to other viral communicable diseases: Secondary | ICD-10-CM | POA: Insufficient documentation

## 2019-09-25 HISTORY — DX: Male erectile dysfunction, unspecified: N52.9

## 2019-09-25 HISTORY — DX: Peripheral vascular disease, unspecified: I73.9

## 2019-09-25 HISTORY — DX: Hyperlipidemia, unspecified: E78.5

## 2019-09-25 HISTORY — DX: Gastro-esophageal reflux disease without esophagitis: K21.9

## 2019-09-25 LAB — SURGICAL PCR SCREEN
MRSA, PCR: NEGATIVE
Staphylococcus aureus: NEGATIVE

## 2019-09-25 LAB — ABO/RH: ABO/RH(D): A POS

## 2019-09-25 NOTE — Progress Notes (Signed)
SPOKE W/  Thayer Jew     SCREENING SYMPTOMS OF COVID 19:   COUGH--NO  RUNNY NOSE--- NO  SORE THROAT---NO  NASAL CONGESTION----NO  SNEEZING----NO  SHORTNESS OF BREATH---NO  DIFFICULTY BREATHING---NO  TEMP >100.0 -----NO  UNEXPLAINED BODY ACHES------NO  CHILLS -------- NO  HEADACHES ---------NO  LOSS OF SMELL/ TASTE --------NO    HAVE YOU OR ANY FAMILY MEMBER TRAVELLED PAST 14 DAYS OUT OF THE   COUNTY---NO STATE----NO COUNTRY----NO  HAVE YOU OR ANY FAMILY MEMBER BEEN EXPOSED TO ANYONE WITH COVID 19? NO

## 2019-09-25 NOTE — Progress Notes (Signed)
PCP - Dr. Deloris Ping, medical clearance 09/23/2019 in epic Cardiologist - Dr. Rosalia Hammers  Chest x-ray - N/A EKG - 09/23/2019 in epic Stress Test - N/A ECHO - N/A Cardiac Cath - N/A Peripheral vascular cath 01/31/2019 in epic  Sleep Study - N/A CPAP - N/A  Fasting Blood Sugar - N/A Checks Blood Sugar _N/A____ times a day  Blood Thinner Instructions:  N/A Aspirin Instructions: Yes Last Dose:  Last dose 09/21/2019  Anesthesia review: PAD, HTN  Patient denies shortness of breath, fever, cough and chest pain at PAT appointment   Patient verbalized understanding of instructions that were given to them at the PAT appointment. Patient was also instructed that they will need to review over the PAT instructions again at home before surgery.

## 2019-09-26 ENCOUNTER — Encounter: Payer: Self-pay | Admitting: Family Medicine

## 2019-09-26 MED ORDER — OLMESARTAN MEDOXOMIL-HCTZ 40-25 MG PO TABS: 1.0000 | ORAL_TABLET | Freq: Every evening | ORAL | 1 refills | Status: DC

## 2019-09-26 NOTE — H&P (Signed)
TOTAL HIP ADMISSION H&P  Patient is admitted for left total hip arthroplasty, anterior approach.  Subjective:  Chief Complaint:   Left hip primary OA / pain  HPI: Bryan Rivers, 78 y.o. male, has a history of pain and functional disability in the left hip(s) due to arthritis and patient has failed non-surgical conservative treatments for greater than 12 weeks to include NSAID's and/or analgesics and activity modification.  Onset of symptoms was gradual starting in January 2020  with rapidlly worsening course since that time.The patient noted no past surgery on the left hip(s).  Patient currently rates pain in the left hip at 8 out of 10 with activity. Patient has worsening of pain with activity and weight bearing, trendelenberg gait, pain that interfers with activities of daily living and pain with passive range of motion. Patient has evidence of periarticular osteophytes and joint space narrowing by imaging studies. This condition presents safety issues increasing the risk of falls. There is no current active infection.  Risks, benefits and expectations were discussed with the patient.  Risks including but not limited to the risk of anesthesia, blood clots, nerve damage, blood vessel damage, failure of the prosthesis, infection and up to and including death.  Patient understand the risks, benefits and expectations and wishes to proceed with surgery.   PCP: Shelda Pal, DO  D/C Plans:       Home   Post-op Meds:       No Rx given   Tranexamic Acid:      To be given - IV   Decadron:      Is to be given  FYI:      ASA  [place]  DME:   Pt already has equipment   PT:   [facility]  Pharmacy: [facility]    Patient Active Problem List   Diagnosis Date Noted  . PAD (peripheral artery disease) ([place]) 02/21/2019  . Arthritis of left hip 01/23/2019  . Chronic left shoulder pain 12/26/2018  . Chronic pain of left knee 12/26/2018  . Lower back injury, initial encounter 01/02/2017   . Left wrist injury 11/09/2015  . Left rib fracture 11/09/2015  . Morbid obesity ([place]) 02/11/2013  . Hyperglycemia 11/04/2012  . Essential hypertension 09/05/2011  . Hyperlipidemia 09/05/2011  . Malignant neoplasm of prostate ([place]) 09/05/2011   Past Medical History:  Diagnosis Date  . 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 ED (erectile dysfunction)   . GERD (gastroesophageal reflux disease)   . Hyperlipemia   . Hypertension   . Morbid obesity ([place]) 02/11/2013  . PAD (peripheral artery disease) ([place])   . Prostate cancer [facility])     Past Surgical History:  Procedure Laterality Date  . ABDOMINAL AORTOGRAM W/LOWER EXTREMITY Bilateral 01/31/2019   Procedure: ABDOMINAL AORTOGRAM W/LOWER EXTREMITY;  Surgeon: Angelia Mould, MD;  Location: [facility] [place] [facility];  Service: Cardiovascular;  Laterality: Bilateral;  . APPENDECTOMY  1950   age 52  . BYPASS GRAFT FEMORAL-PERONEAL Left 02/21/2019   Procedure: BYPASS GRAFT FEMORAL-PERONEAL WITH VEIN GRAFT;  Surgeon: Angelia Mould, MD;  Location: [facility];  Service: Vascular;  Laterality: Left;  . COLONOSCOPY    . WRIST SURGERY Left 2015    No current facility-administered medications for this encounter.    Current Outpatient Medications  Medication Sig Dispense Refill Last Dose  . aspirin EC 81 MG EC tablet Take 1 tablet (81 mg total) by mouth daily. 30 tablet 1   .  olmesartan-hydrochlorothiazide (BENICAR HCT) 40-25 MG tablet Take 1 tablet by mouth every evening. 90 tablet 1   . Polyethyl Glycol-Propyl Glycol (LUBRICANT EYE DROPS) 0.4-0.3 % SOLN Place 1 drop into both eyes 3 (three) times daily as needed (dry/irritated eyes.).     Marland Kitchen rosuvastatin (CRESTOR) 20 MG tablet Take 1 tablet (20 mg total) by mouth daily. 90 tablet 3    No Known Allergies  Social History   Tobacco Use  . Smoking status: Never Smoker  . Smokeless tobacco: Never Used  Substance Use Topics  . Alcohol  use: Not Currently    Alcohol/week: 0.0 standard drinks    Comment: occ wine    Family History  Problem Relation Age of Onset  . Heart disease Mother   . Diabetes Mother      Review of Systems  Constitutional: Negative.   HENT: Negative.   Eyes: Negative.   Respiratory: Negative.   Cardiovascular: Negative.   Gastrointestinal: Negative.   Genitourinary: Negative.   Musculoskeletal: Positive for joint pain.  Skin: Negative.   Neurological: Negative.   Endo/Heme/Allergies: Negative.   Psychiatric/Behavioral: Negative.     Objective:  Physical Exam  Constitutional: He is oriented to person, place, and time. He appears well-developed.  HENT:  Head: Normocephalic.  Eyes: Pupils are equal, round, and reactive to light.  Neck: Neck supple. No JVD present. No tracheal deviation present. No thyromegaly present.  Cardiovascular: Normal rate, regular rhythm and intact distal pulses.  Respiratory: Effort normal and breath sounds normal. No respiratory distress. He has no wheezes.  GI: Soft. There is no abdominal tenderness. There is no guarding.  Musculoskeletal:     Left hip: He exhibits decreased range of motion, decreased strength, tenderness and bony tenderness. He exhibits no swelling, no deformity and no laceration.  Lymphadenopathy:    He has no cervical adenopathy.  Neurological: He is alert and oriented to person, place, and time. A sensory deficit (occassional numbness/tingling left LE) is present.  Skin: Skin is warm and dry.  Psychiatric: He has a normal mood and affect.      Labs:  Estimated body mass index is 30.84 kg/m as calculated from the following:   Height as of 09/23/19: 5\' 11"  (1.803 m).   Weight as of 09/23/19: 100.3 kg.   Imaging Review Plain radiographs demonstrate severe degenerative joint disease of the left hip(s). The bone quality appears to be good for age and reported activity level.      Assessment/Plan:  End stage arthritis, left  hip(s)  The patient history, physical examination, clinical judgement of the provider and imaging studies are consistent with end stage degenerative joint disease of the left hip(s) and total hip arthroplasty is deemed medically necessary. The treatment options including medical management, injection therapy, arthroscopy and arthroplasty were discussed at length. The risks and benefits of total hip arthroplasty were presented and reviewed. The risks due to aseptic loosening, infection, stiffness, dislocation/subluxation,  thromboembolic complications and other imponderables were discussed.  The patient acknowledged the explanation, agreed to proceed with the plan and consent was signed. Patient is being admitted for inpatient treatment for surgery, pain control, PT, OT, prophylactic antibiotics, VTE prophylaxis, progressive ambulation and ADL's and discharge planning.The patient is planning to be discharged home.   Bryan Pugh Anubis [place]   PA-C  09/26/2019, 1:01 PM

## 2019-09-28 IMAGING — CR LEFT ANG/EXT/UNI/ OR
1 series · 1 of 1 positions shown · non-contrast
Comparison: None.

CLINICAL DATA: Left SFA to peroneal bypass graft placement
secondary to occluded popliteal artery with critical limb ischemia.

EXAM:
PANKAJ SINGH LEFEBVRE/EXT/UNI/ OR

[AP]
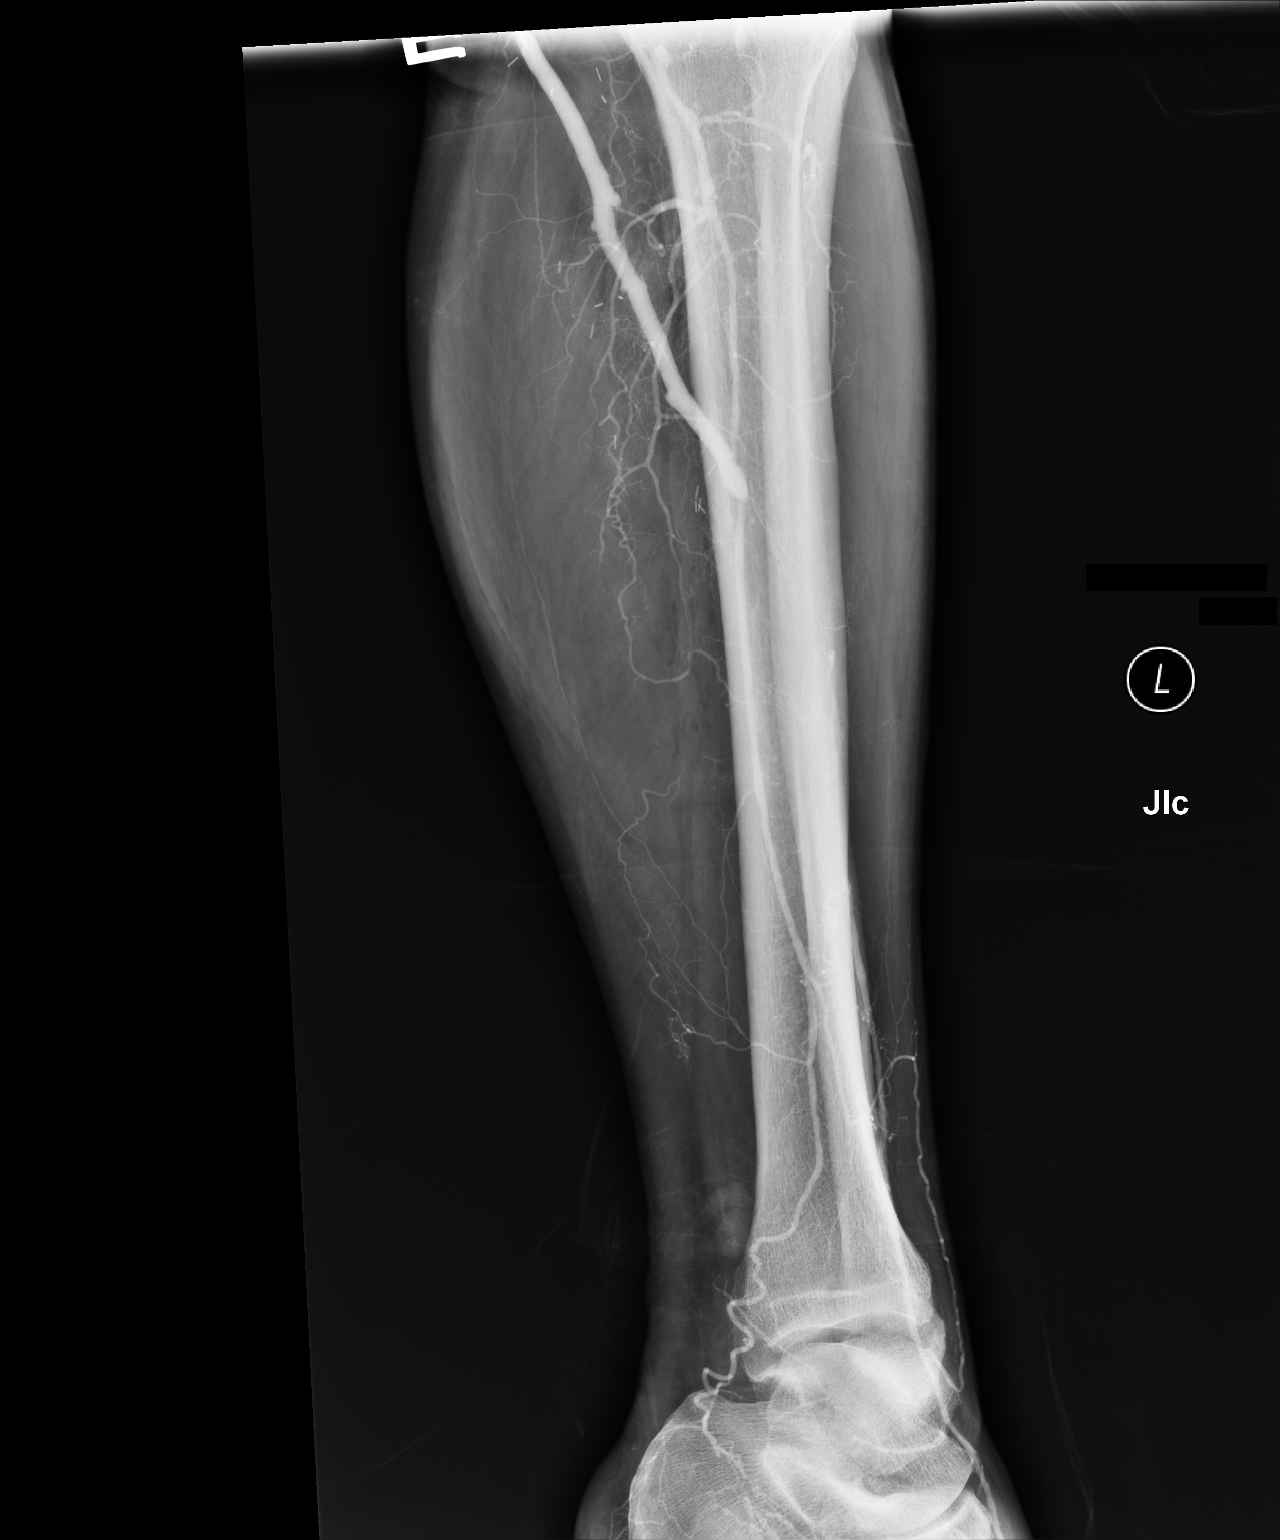

[1 of 1 positions shown; findings below may reference images not displayed]

FINDINGS: Intraoperative image demonstrates patent venous bypass graft with
anastomosis to the peroneal artery at roughly the mid calf level.
There is visible runoff into the foot with medial plantar supply and
reconstitution of the distal anterior tibial and dorsalis pedis
arteries.
IMPRESSION: Patent left lower extremity SFA to peroneal bypass graft with patent
runoff into the foot.

## 2019-09-29 ENCOUNTER — Other Ambulatory Visit (HOSPITAL_COMMUNITY)
Admission: RE | Admit: 2019-09-29 | Discharge: 2019-09-29 | Disposition: A | Discharge status: Home / Self Care | Payer: Medicare Other | Source: Ambulatory Visit | Attending: Orthopedic Surgery | Admitting: Orthopedic Surgery

## 2019-09-29 DIAGNOSIS — Z01812 Encounter for preprocedural laboratory examination: Secondary | ICD-10-CM | POA: Insufficient documentation

## 2019-09-29 DIAGNOSIS — Z20828 Contact with and (suspected) exposure to other viral communicable diseases: Secondary | ICD-10-CM | POA: Insufficient documentation

## 2019-09-29 NOTE — Anesthesia Preprocedure Evaluation (Addendum)
Anesthesia Evaluation  Patient identified by MRN, date of birth, ID band Patient awake    Reviewed: Allergy & Precautions, NPO status , Patient's Chart, lab work & pertinent test results  Airway Mallampati: II  TM Distance: >3 FB Neck ROM: Full    Dental no notable dental hx.    Pulmonary neg pulmonary ROS,    Pulmonary exam normal breath sounds clear to auscultation       Cardiovascular hypertension, Pt. on medications + Peripheral Vascular Disease  Normal cardiovascular exam Rhythm:Regular Rate:Normal     Neuro/Psych negative neurological ROS  negative psych ROS   GI/Hepatic negative GI ROS, Neg liver ROS,   Endo/Other  negative endocrine ROS  Renal/GU negative Renal ROS  negative genitourinary   Musculoskeletal negative musculoskeletal ROS (+)   Abdominal   Peds negative pediatric ROS (+)  Hematology negative hematology ROS (+)   Anesthesia Other Findings   Reproductive/Obstetrics negative OB ROS                            Anesthesia Physical Anesthesia Plan  ASA: II  Anesthesia Plan: Spinal   Post-op Pain Management:    Induction:   PONV Risk Score and Plan: 1 and Treatment may vary due to age or medical condition  Airway Management Planned: Simple Face Mask  Additional Equipment:   Intra-op Plan:   Post-operative Plan:   Informed Consent: I have reviewed the patients History and Physical, chart, labs and discussed the procedure including the risks, benefits and alternatives for the proposed anesthesia with the patient or authorized representative who has indicated his/her understanding and acceptance.     Dental advisory given  Plan Discussed with: CRNA  Anesthesia Plan Comments: (See PAT note 09/25/2019, Konrad Felix, PA-C)       Anesthesia Quick Evaluation

## 2019-09-29 NOTE — Progress Notes (Signed)
Anesthesia Chart Review   Case: [id number] Date/Time: 10/02/19 0700   Procedure: TOTAL HIP ARTHROPLASTY ANTERIOR APPROACH (Left Hip) - 70 mins   Anesthesia type: Spinal   Pre-op diagnosis: Left hip osteoarthritis   Location: [facility] ROOM 09 / [facility]   Surgeon: Paralee Cancel, MD      DISCUSSION:78 y.o. never smoker with h/o GERD, HTN, prostate cancer, PAD s/p femoral-peroneal bypass graft 02/21/2019, left hip OA scheduled for above procedure 10/02/2019 with Dr. Paralee Cancel.   Cleared by PCP, Dr. Riki Sheer.  Per OV note 09/23/2019, "Low cardiac risk for surgery. Will ck w vascular team regarding arterial thrombus. Rec'd pt sent a message as well. Pending the above workup, the patient is deemed low cardiac risk for the proposed procedure."  Pt last seen by vascular surgeon, Dr. [name], 09/23/2019. Per OV note, "He is having terrible pain with his left hip and is being considered for left hip replacement.  Given that the bypass originates well below the hip I do not see any contraindications to hip surgery.  If his aspirin needs to be stopped prior to surgery certainly this is reasonable and should simply be restarted as soon as possible.  I have ordered a follow-up graft duplex and ABIs in 6 months and I will see him back at that time."  Anticipate pt can proceed with planned procedure barring acute status change.   VS: There were no vitals taken for this visit.  PROVIDERS: Shelda Pal, DO is PCP   [name], MD is Vascular Surgeon LABS: Labs reviewed: Acceptable for surgery. (all labs ordered are listed, but only abnormal results are displayed)  Labs Reviewed  SURGICAL PCR SCREEN  TYPE AND SCREEN  ABO/RH     IMAGES:   EKG: 09/23/2019 Rate 71 bpm Sinus rhythm   CV:  Past Medical History:  Diagnosis Date  . Aneurysm of left popliteal artery ([place]) 01/31/2019   thrombosed left popliteal artery aneurysm  . ED (erectile dysfunction)   .  GERD (gastroesophageal reflux disease)   . Hyperlipemia   . Hypertension   . Morbid obesity ([place]) 02/11/2013  . PAD (peripheral artery disease) ([place])   . Prostate cancer [facility])     Past Surgical History:  Procedure Laterality Date  . ABDOMINAL AORTOGRAM W/LOWER EXTREMITY Bilateral 01/31/2019   Procedure: ABDOMINAL AORTOGRAM W/LOWER EXTREMITY;  Surgeon: Angelia Mould, MD;  Location: [place] [facility];  Service: Cardiovascular;  Laterality: Bilateral;  . APPENDECTOMY  1950   age 13  . BYPASS GRAFT FEMORAL-PERONEAL Left 02/21/2019   Procedure: BYPASS GRAFT FEMORAL-PERONEAL WITH VEIN GRAFT;  Surgeon: Angelia Mould, MD;  Location: [place];  Service: Vascular;  Laterality: Left;  . COLONOSCOPY    . WRIST SURGERY Left 2015    MEDICATIONS: . aspirin EC 81 MG EC tablet  . olmesartan-hydrochlorothiazide (BENICAR HCT) 40-25 MG tablet  . Polyethyl Glycol-Propyl Glycol (LUBRICANT EYE DROPS) 0.4-0.3 % SOLN  . rosuvastatin (CRESTOR) 20 MG tablet   No current facility-administered medications for this encounter.     Maia Plan [facility] Pre-Surgical Testing [phone] 09/29/19  2:54 PM

## 2019-09-30 LAB — NOVEL CORONAVIRUS, NAA (HOSP ORDER, SEND-OUT TO REF LAB; TAT 18-24 HRS): SARS-CoV-2, NAA: NOT DETECTED

## 2019-10-02 ENCOUNTER — Other Ambulatory Visit: Payer: Self-pay

## 2019-10-02 ENCOUNTER — Encounter (HOSPITAL_COMMUNITY): Payer: Self-pay | Admitting: *Deleted

## 2019-10-02 ENCOUNTER — Inpatient Hospital Stay (HOSPITAL_COMMUNITY): Payer: Medicare Other | Admitting: Certified Registered"

## 2019-10-02 ENCOUNTER — Observation Stay (HOSPITAL_COMMUNITY): Payer: Medicare Other

## 2019-10-02 ENCOUNTER — Observation Stay (HOSPITAL_COMMUNITY)
Admission: RE | Admit: 2019-10-02 | Discharge: 2019-10-03 | Disposition: A | Discharge status: Home / Self Care | Payer: Medicare Other | Attending: Orthopedic Surgery | Admitting: Orthopedic Surgery

## 2019-10-02 ENCOUNTER — Inpatient Hospital Stay (HOSPITAL_COMMUNITY): Payer: Medicare Other

## 2019-10-02 ENCOUNTER — Inpatient Hospital Stay (HOSPITAL_COMMUNITY): Payer: Medicare Other | Admitting: Physician Assistant

## 2019-10-02 ENCOUNTER — Encounter (HOSPITAL_COMMUNITY)
Admission: RE | Disposition: A | Discharge status: Home / Self Care | Payer: Self-pay | Source: Home / Self Care | Attending: Orthopedic Surgery

## 2019-10-02 DIAGNOSIS — M1612 Unilateral primary osteoarthritis, left hip: Principal | ICD-10-CM | POA: Insufficient documentation

## 2019-10-02 DIAGNOSIS — Z8546 Personal history of malignant neoplasm of prostate: Secondary | ICD-10-CM | POA: Diagnosis not present

## 2019-10-02 DIAGNOSIS — I739 Peripheral vascular disease, unspecified: Secondary | ICD-10-CM | POA: Diagnosis not present

## 2019-10-02 DIAGNOSIS — Z6830 Body mass index (BMI) 30.0-30.9, adult: Secondary | ICD-10-CM | POA: Diagnosis not present

## 2019-10-02 DIAGNOSIS — Z79899 Other long term (current) drug therapy: Secondary | ICD-10-CM | POA: Diagnosis not present

## 2019-10-02 DIAGNOSIS — I10 Essential (primary) hypertension: Secondary | ICD-10-CM | POA: Diagnosis not present

## 2019-10-02 DIAGNOSIS — Z419 Encounter for procedure for purposes other than remedying health state, unspecified: Secondary | ICD-10-CM

## 2019-10-02 DIAGNOSIS — Z96642 Presence of left artificial hip joint: Secondary | ICD-10-CM

## 2019-10-02 DIAGNOSIS — E785 Hyperlipidemia, unspecified: Secondary | ICD-10-CM | POA: Diagnosis not present

## 2019-10-02 DIAGNOSIS — Z7982 Long term (current) use of aspirin: Secondary | ICD-10-CM | POA: Diagnosis not present

## 2019-10-02 DIAGNOSIS — Z96649 Presence of unspecified artificial hip joint: Secondary | ICD-10-CM

## 2019-10-02 HISTORY — PX: TOTAL HIP ARTHROPLASTY: SHX124

## 2019-10-02 LAB — TYPE AND SCREEN
ABO/RH(D): A POS
Antibody Screen: NEGATIVE

## 2019-10-02 SURGERY — ARTHROPLASTY, HIP, TOTAL, ANTERIOR APPROACH
Anesthesia: Spinal | Site: Hip | Laterality: Left

## 2019-10-02 MED ORDER — TRANEXAMIC ACID-NACL 1000-0.7 MG/100ML-% IV SOLN
1000.0000 mg | Freq: Once | INTRAVENOUS | Status: AC
  Administered 2019-10-02: 1000 mg via INTRAVENOUS
  Filled 2019-10-02: qty 100

## 2019-10-02 MED ORDER — DEXAMETHASONE SODIUM PHOSPHATE 10 MG/ML IJ SOLN
10.0000 mg | Freq: Once | INTRAMUSCULAR | Status: AC
  Administered 2019-10-02: 09:00:00 8 mg via INTRAVENOUS

## 2019-10-02 MED ORDER — FERROUS SULFATE 325 (65 FE) MG PO TABS
325.0000 mg | ORAL_TABLET | Freq: Three times a day (TID) | ORAL | Status: DC
  Administered 2019-10-03: 325 mg via ORAL
  Filled 2019-10-02: qty 1

## 2019-10-02 MED ORDER — ASPIRIN 81 MG PO CHEW
81.0000 mg | CHEWABLE_TABLET | Freq: Two times a day (BID) | ORAL | Status: DC
  Administered 2019-10-02 – 2019-10-03 (×2): 81 mg via ORAL
  Filled 2019-10-02 (×2): qty 1

## 2019-10-02 MED ORDER — METHOCARBAMOL 500 MG IVPB - SIMPLE MED
500.0000 mg | Freq: Four times a day (QID) | INTRAVENOUS | Status: DC | PRN
  Administered 2019-10-02: 12:00:00 500 mg via INTRAVENOUS
  Filled 2019-10-02: qty 50

## 2019-10-02 MED ORDER — PROPOFOL 500 MG/50ML IV EMUL
INTRAVENOUS | Status: AC
  Filled 2019-10-02: qty 50

## 2019-10-02 MED ORDER — MENTHOL 3 MG MT LOZG: 1.0000 | LOZENGE | OROMUCOSAL | Status: DC | PRN

## 2019-10-02 MED ORDER — PHENYLEPHRINE HCL (PRESSORS) 10 MG/ML IV SOLN
INTRAVENOUS | Status: AC
  Filled 2019-10-02: qty 1

## 2019-10-02 MED ORDER — DEXAMETHASONE SODIUM PHOSPHATE 10 MG/ML IJ SOLN
INTRAMUSCULAR | Status: AC
  Filled 2019-10-02: qty 1

## 2019-10-02 MED ORDER — FENTANYL CITRATE (PF) 100 MCG/2ML IJ SOLN
INTRAMUSCULAR | Status: AC
  Administered 2019-10-02: 50 ug via INTRAVENOUS
  Filled 2019-10-02: qty 2

## 2019-10-02 MED ORDER — STERILE WATER FOR IRRIGATION IR SOLN
Status: DC | PRN
  Administered 2019-10-02: 2000 mL

## 2019-10-02 MED ORDER — IRBESARTAN 150 MG PO TABS: 300.0000 mg | ORAL_TABLET | Freq: Every evening | ORAL | Status: DC

## 2019-10-02 MED ORDER — ACETAMINOPHEN 325 MG PO TABS: 325.0000 mg | ORAL_TABLET | Freq: Four times a day (QID) | ORAL | Status: DC | PRN

## 2019-10-02 MED ORDER — ALUM & MAG HYDROXIDE-SIMETH 200-200-20 MG/5ML PO SUSP: 15.0000 mL | ORAL | Status: DC | PRN

## 2019-10-02 MED ORDER — HYDROCHLOROTHIAZIDE 25 MG PO TABS: 25.0000 mg | ORAL_TABLET | Freq: Every evening | ORAL | Status: DC

## 2019-10-02 MED ORDER — PHENOL 1.4 % MT LIQD: 1.0000 | OROMUCOSAL | Status: DC | PRN

## 2019-10-02 MED ORDER — EPHEDRINE 5 MG/ML INJ
INTRAVENOUS | Status: AC
  Filled 2019-10-02: qty 10

## 2019-10-02 MED ORDER — ONDANSETRON HCL 4 MG/2ML IJ SOLN
INTRAMUSCULAR | Status: AC
  Filled 2019-10-02: qty 2

## 2019-10-02 MED ORDER — FENTANYL CITRATE (PF) 100 MCG/2ML IJ SOLN
25.0000 ug | INTRAMUSCULAR | Status: DC | PRN
  Administered 2019-10-02: 12:00:00 50 ug via INTRAVENOUS

## 2019-10-02 MED ORDER — OLMESARTAN MEDOXOMIL-HCTZ 40-25 MG PO TABS: 1.0000 | ORAL_TABLET | Freq: Every evening | ORAL | Status: DC

## 2019-10-02 MED ORDER — CEFAZOLIN SODIUM-DEXTROSE 2-4 GM/100ML-% IV SOLN
2.0000 g | INTRAVENOUS | Status: AC
  Administered 2019-10-02: 09:00:00 2 g via INTRAVENOUS

## 2019-10-02 MED ORDER — ONDANSETRON HCL 4 MG/2ML IJ SOLN
4.0000 mg | Freq: Four times a day (QID) | INTRAMUSCULAR | Status: DC | PRN
  Administered 2019-10-03: 4 mg via INTRAVENOUS
  Filled 2019-10-02: qty 2

## 2019-10-02 MED ORDER — CEFAZOLIN SODIUM-DEXTROSE 2-4 GM/100ML-% IV SOLN
INTRAVENOUS | Status: AC
  Filled 2019-10-02: qty 100

## 2019-10-02 MED ORDER — MIDAZOLAM HCL 2 MG/2ML IJ SOLN
INTRAMUSCULAR | Status: DC | PRN
  Administered 2019-10-02: 2 mg via INTRAVENOUS

## 2019-10-02 MED ORDER — TRANEXAMIC ACID-NACL 1000-0.7 MG/100ML-% IV SOLN
INTRAVENOUS | Status: AC
  Filled 2019-10-02: qty 100

## 2019-10-02 MED ORDER — PHENYLEPHRINE HCL-NACL 10-0.9 MG/250ML-% IV SOLN
INTRAVENOUS | Status: DC | PRN
  Administered 2019-10-02: 25 ug/min via INTRAVENOUS

## 2019-10-02 MED ORDER — METHOCARBAMOL 500 MG IVPB - SIMPLE MED
INTRAVENOUS | Status: AC
  Administered 2019-10-02: 500 mg via INTRAVENOUS
  Filled 2019-10-02: qty 50

## 2019-10-02 MED ORDER — EPHEDRINE SULFATE-NACL 50-0.9 MG/10ML-% IV SOSY
PREFILLED_SYRINGE | INTRAVENOUS | Status: DC | PRN
  Administered 2019-10-02: 10 mg via INTRAVENOUS

## 2019-10-02 MED ORDER — CHLORHEXIDINE GLUCONATE 4 % EX LIQD: 60.0000 mL | Freq: Once | CUTANEOUS | Status: DC

## 2019-10-02 MED ORDER — DEXAMETHASONE SODIUM PHOSPHATE 10 MG/ML IJ SOLN
10.0000 mg | Freq: Once | INTRAMUSCULAR | Status: AC
  Administered 2019-10-03: 10 mg via INTRAVENOUS
  Filled 2019-10-02: qty 1

## 2019-10-02 MED ORDER — DOCUSATE SODIUM 100 MG PO CAPS
100.0000 mg | ORAL_CAPSULE | Freq: Two times a day (BID) | ORAL | Status: DC
  Administered 2019-10-02 – 2019-10-03 (×2): 100 mg via ORAL
  Filled 2019-10-02 (×2): qty 1

## 2019-10-02 MED ORDER — ONDANSETRON HCL 4 MG PO TABS: 4.0000 mg | ORAL_TABLET | Freq: Four times a day (QID) | ORAL | Status: DC | PRN

## 2019-10-02 MED ORDER — BISACODYL 10 MG RE SUPP: 10.0000 mg | Freq: Every day | RECTAL | Status: DC | PRN

## 2019-10-02 MED ORDER — PROPOFOL 10 MG/ML IV BOLUS
INTRAVENOUS | Status: AC
  Filled 2019-10-02: qty 20

## 2019-10-02 MED ORDER — ROSUVASTATIN CALCIUM 20 MG PO TABS
20.0000 mg | ORAL_TABLET | Freq: Every day | ORAL | Status: DC
  Administered 2019-10-02: 20 mg via ORAL
  Filled 2019-10-02: qty 1

## 2019-10-02 MED ORDER — POLYETHYLENE GLYCOL 3350 17 G PO PACK
17.0000 g | PACK | Freq: Two times a day (BID) | ORAL | Status: DC
  Administered 2019-10-02: 17 g via ORAL
  Filled 2019-10-02 (×2): qty 1

## 2019-10-02 MED ORDER — HYDROCODONE-ACETAMINOPHEN 5-325 MG PO TABS
1.0000 | ORAL_TABLET | ORAL | Status: DC | PRN
  Administered 2019-10-02 – 2019-10-03 (×3): 2 via ORAL
  Filled 2019-10-02 (×4): qty 2

## 2019-10-02 MED ORDER — MORPHINE SULFATE (PF) 2 MG/ML IV SOLN
0.5000 mg | INTRAVENOUS | Status: DC | PRN
  Administered 2019-10-02 – 2019-10-03 (×3): 1 mg via INTRAVENOUS
  Filled 2019-10-02 (×3): qty 1

## 2019-10-02 MED ORDER — LACTATED RINGERS IV SOLN
INTRAVENOUS | Status: DC
  Administered 2019-10-02 (×2): via INTRAVENOUS

## 2019-10-02 MED ORDER — METOCLOPRAMIDE HCL 5 MG/ML IJ SOLN: 10.0000 mg | Freq: Once | INTRAMUSCULAR | Status: DC | PRN

## 2019-10-02 MED ORDER — TRANEXAMIC ACID-NACL 1000-0.7 MG/100ML-% IV SOLN
1000.0000 mg | INTRAVENOUS | Status: AC
  Administered 2019-10-02: 09:00:00 1000 mg via INTRAVENOUS

## 2019-10-02 MED ORDER — MEPERIDINE HCL 50 MG/ML IJ SOLN: 6.2500 mg | INTRAMUSCULAR | Status: DC | PRN

## 2019-10-02 MED ORDER — 0.9 % SODIUM CHLORIDE (POUR BTL) OPTIME
TOPICAL | Status: DC | PRN
  Administered 2019-10-02: 10:00:00 1000 mL

## 2019-10-02 MED ORDER — METOCLOPRAMIDE HCL 5 MG PO TABS: 5.0000 mg | ORAL_TABLET | Freq: Three times a day (TID) | ORAL | Status: DC | PRN

## 2019-10-02 MED ORDER — PROPOFOL 500 MG/50ML IV EMUL
INTRAVENOUS | Status: DC | PRN
  Administered 2019-10-02: 135 ug/kg/min via INTRAVENOUS

## 2019-10-02 MED ORDER — BUPIVACAINE IN DEXTROSE 0.75-8.25 % IT SOLN
INTRATHECAL | Status: DC | PRN
  Administered 2019-10-02: 1.7 mL via INTRATHECAL

## 2019-10-02 MED ORDER — ONDANSETRON HCL 4 MG/2ML IJ SOLN
INTRAMUSCULAR | Status: DC | PRN
  Administered 2019-10-02: 4 mg via INTRAVENOUS

## 2019-10-02 MED ORDER — METHOCARBAMOL 500 MG PO TABS
500.0000 mg | ORAL_TABLET | Freq: Four times a day (QID) | ORAL | Status: DC | PRN
  Administered 2019-10-02 – 2019-10-03 (×2): 500 mg via ORAL
  Filled 2019-10-02 (×2): qty 1

## 2019-10-02 MED ORDER — MIDAZOLAM HCL 2 MG/2ML IJ SOLN
INTRAMUSCULAR | Status: AC
  Filled 2019-10-02: qty 2

## 2019-10-02 MED ORDER — DIPHENHYDRAMINE HCL 12.5 MG/5ML PO ELIX: 12.5000 mg | ORAL_SOLUTION | ORAL | Status: DC | PRN

## 2019-10-02 MED ORDER — POLYVINYL ALCOHOL 1.4 % OP SOLN
1.0000 [drp] | Freq: Three times a day (TID) | OPHTHALMIC | Status: DC | PRN
  Filled 2019-10-02: qty 15

## 2019-10-02 MED ORDER — METOCLOPRAMIDE HCL 5 MG/ML IJ SOLN
5.0000 mg | Freq: Three times a day (TID) | INTRAMUSCULAR | Status: DC | PRN
  Administered 2019-10-03: 10 mg via INTRAVENOUS
  Filled 2019-10-02: qty 2

## 2019-10-02 MED ORDER — CEFAZOLIN SODIUM-DEXTROSE 2-4 GM/100ML-% IV SOLN
2.0000 g | Freq: Four times a day (QID) | INTRAVENOUS | Status: AC
  Administered 2019-10-02 (×2): 2 g via INTRAVENOUS
  Filled 2019-10-02 (×2): qty 100

## 2019-10-02 MED ORDER — HYDROCODONE-ACETAMINOPHEN 7.5-325 MG PO TABS
1.0000 | ORAL_TABLET | ORAL | Status: DC | PRN
  Administered 2019-10-02 – 2019-10-03 (×2): 2 via ORAL
  Filled 2019-10-02 (×2): qty 2

## 2019-10-02 MED ORDER — MAGNESIUM CITRATE PO SOLN: 1.0000 | Freq: Once | ORAL | Status: DC | PRN

## 2019-10-02 MED ORDER — SODIUM CHLORIDE 0.9 % IV SOLN
INTRAVENOUS | Status: DC
  Administered 2019-10-02 – 2019-10-03 (×2): via INTRAVENOUS

## 2019-10-02 MED ORDER — PROPOFOL 10 MG/ML IV BOLUS
INTRAVENOUS | Status: DC | PRN
  Administered 2019-10-02 (×2): 20 mg via INTRAVENOUS

## 2019-10-02 SURGICAL SUPPLY — 48 items
ARTICULEZE HEAD (Hips) ×3 IMPLANT
BAG DECANTER FOR FLEXI CONT (MISCELLANEOUS) IMPLANT
BAG ZIPLOCK 12X15 (MISCELLANEOUS) IMPLANT
BLADE SAG 18X100X1.27 (BLADE) ×3 IMPLANT
BLADE SURG SZ10 CARB STEEL (BLADE) ×6 IMPLANT
COVER PERINEAL POST (MISCELLANEOUS) ×3 IMPLANT
COVER SURGICAL LIGHT HANDLE (MISCELLANEOUS) ×3 IMPLANT
COVER WAND RF STERILE (DRAPES) IMPLANT
CUP ACET PINNACLE SECTR 58MM (Hips) IMPLANT
DERMABOND ADVANCED (GAUZE/BANDAGES/DRESSINGS) ×2
DERMABOND ADVANCED .7 DNX12 (GAUZE/BANDAGES/DRESSINGS) ×1 IMPLANT
DRAPE STERI IOBAN 125X83 (DRAPES) ×3 IMPLANT
DRAPE U-SHAPE 47X51 STRL (DRAPES) ×6 IMPLANT
DRESSING AQUACEL AG SP 3.5X10 (GAUZE/BANDAGES/DRESSINGS) ×1 IMPLANT
DRSG AQUACEL AG ADV 3.5X10 (GAUZE/BANDAGES/DRESSINGS) ×2 IMPLANT
DRSG AQUACEL AG SP 3.5X10 (GAUZE/BANDAGES/DRESSINGS) ×3
DURAPREP 26ML APPLICATOR (WOUND CARE) ×3 IMPLANT
ELECT BLADE TIP CTD 4 INCH (ELECTRODE) ×3 IMPLANT
ELECT REM PT RETURN 15FT ADLT (MISCELLANEOUS) ×3 IMPLANT
ELIMINATOR HOLE APEX DEPUY (Hips) ×2 IMPLANT
GLOVE BIO SURGEON STRL SZ 6 (GLOVE) ×6 IMPLANT
GLOVE BIOGEL PI IND STRL 6.5 (GLOVE) ×1 IMPLANT
GLOVE BIOGEL PI IND STRL 7.5 (GLOVE) ×1 IMPLANT
GLOVE BIOGEL PI IND STRL 8.5 (GLOVE) ×1 IMPLANT
GLOVE BIOGEL PI INDICATOR 6.5 (GLOVE) ×2
GLOVE BIOGEL PI INDICATOR 7.5 (GLOVE) ×2
GLOVE BIOGEL PI INDICATOR 8.5 (GLOVE) ×2
GLOVE ECLIPSE 8.0 STRL XLNG CF (GLOVE) ×8 IMPLANT
GLOVE ORTHO TXT STRL SZ7.5 (GLOVE) ×8 IMPLANT
GOWN STRL REUS W/TWL LRG LVL3 (GOWN DISPOSABLE) ×6 IMPLANT
GOWN STRL REUS W/TWL XL LVL3 (GOWN DISPOSABLE) ×3 IMPLANT
HEAD ARTICULEZE (Hips) IMPLANT
HOLDER FOLEY CATH W/STRAP (MISCELLANEOUS) ×3 IMPLANT
KIT TURNOVER KIT A (KITS) IMPLANT
LINER NEUTRAL 36X58 PLUS4 ×2 IMPLANT
PACK ANTERIOR HIP CUSTOM (KITS) ×3 IMPLANT
PINNACLE SECTOR CUP 58MM (Hips) ×3 IMPLANT
SCREW 6.5MMX30MM (Screw) ×2 IMPLANT
STEM FEMORAL SZ6 HIGH ACTIS (Stem) ×2 IMPLANT
SUT MNCRL AB 4-0 PS2 18 (SUTURE) ×3 IMPLANT
SUT STRATAFIX 0 PDS 27 VIOLET (SUTURE) ×3
SUT VIC AB 1 CT1 36 (SUTURE) ×9 IMPLANT
SUT VIC AB 2-0 CT1 27 (SUTURE) ×4
SUT VIC AB 2-0 CT1 TAPERPNT 27 (SUTURE) ×2 IMPLANT
SUTURE STRATFX 0 PDS 27 VIOLET (SUTURE) ×1 IMPLANT
TRAY FOLEY MTR SLVR 16FR STAT (SET/KITS/TRAYS/PACK) ×2 IMPLANT
WATER STERILE IRR 1000ML POUR (IV SOLUTION) ×3 IMPLANT
YANKAUER SUCT BULB TIP 10FT TU (MISCELLANEOUS) IMPLANT

## 2019-10-02 NOTE — Discharge Instructions (Signed)

## 2019-10-02 NOTE — Interval H&P Note (Signed)
History and Physical Interval Note:  10/02/2019 7:34 AM  Bryan Rivers  has presented today for surgery, with the diagnosis of Left hip osteoarthritis.  The various methods of treatment have been discussed with the patient and family. After consideration of risks, benefits and other options for treatment, the patient has consented to  Procedure(s) with comments: TOTAL HIP ARTHROPLASTY ANTERIOR APPROACH (Left) - 70 mins as a surgical intervention.  The patient's history has been reviewed, patient examined, no change in status, stable for surgery.  I have reviewed the patient's chart and labs.  Questions were answered to the patient's satisfaction.     [name]

## 2019-10-02 NOTE — Evaluation (Signed)
Physical Therapy Evaluation Patient Details Name: Bryan Rivers MRN: [id number] DOB: July 08, 1941 Today's Date: 10/02/2019   History of Present Illness  Patient is 78 y.o. male s/p Lt THA anterior approach with PMH significant for prostate cancer, PAD,HTN, HLD, and thrombosed left popliteal artery aneurysm post bypass graft on 02/21/19.    Clinical Impression  Bryan Rivers is a 78 y.o. male POD 0 s/p Lt THA anterior approach. Patient reports independence with mobility at baseline. Patient is now limited by functional impairments (see PT problem list below) and requires min assist for transfers and gait with RW. Patient was able to ambulate ~100 feet with RW and min assist at start for safe use and step pattern but progressed to min guard. Patient instructed in exercise to facilitate circulation. Patient will benefit from continued skilled PT interventions to address impairments and progress towards PLOF. Acute PT will follow to progress mobility and stair training in preparation for safe discharge home.     Follow Up Recommendations Follow surgeon's recommendation for DC plan and follow-up therapies    Equipment Recommendations  None recommended by PT    Recommendations for Other Services       Precautions / Restrictions Precautions Precautions: Fall Restrictions Weight Bearing Restrictions: No      Mobility  Bed Mobility Overal bed mobility: Needs Assistance Bed Mobility: Supine to Sit     Supine to sit: HOB elevated;Min assist     General bed mobility comments: cues for use of bed rails and to assist Lt LE with Rt LE, assist provided to manage Lt LE mobility and to raise trunk up from bed  Transfers Overall transfer level: Needs assistance Equipment used: Rolling walker (2 wheeled) Transfers: Sit to/from Stand Sit to Stand: Min assist;From elevated surface         General transfer comment: verbal/tactile cues for safe hand placement and technique with RW; pt required  assist to initaite and complete power up to rise to walker  Ambulation/Gait Ambulation/Gait assistance: Min assist;Min guard Gait Distance (Feet): 100 Feet Assistive device: Rolling walker (2 wheeled) Gait Pattern/deviations: Step-through pattern;Decreased step length - left;Decreased stride length;Decreased stance time - left Gait velocity: decreased   General Gait Details: cues for safe hand placement on RW during gait required intermittently and for safe step pattern at start, no overt LOB noted and pt progressed to min guard as he ambulated  Stairs            Wheelchair Mobility    Modified Rankin (Stroke Patients Only)       Balance Overall balance assessment: Needs assistance Sitting-balance support: Feet supported;No upper extremity supported Sitting balance-Leahy Scale: Good     Standing balance support: During functional activity;Bilateral upper extremity supported Standing balance-Leahy Scale: Fair              Pertinent Vitals/Pain Pain Assessment: 0-10 Pain Score: 3  Pain Location: Lt hip Pain Descriptors / Indicators: Sore Pain Intervention(s): Limited activity within patient's tolerance;Monitored during session;Repositioned;Ice applied    Home Living Family/patient expects to be discharged to:: Private residence Living Arrangements: Spouse/significant other Available Help at Discharge: Family;Available 24 hours/day Type of Home: House Home Access: Stairs to enter Entrance Stairs-Rails: None Entrance Stairs-Number of Steps: 1 threshold at garage and front door Home Layout: One level Home Equipment: [place] - 4 wheels;Walker - 2 wheels;Bedside commode;Cane - single point      Prior Function Level of Independence: Independent         Comments: Works as Chief Financial Officer, enjoys  golf     Hand Dominance   Dominant Hand: Right    Extremity/Trunk Assessment   Upper Extremity Assessment Upper Extremity Assessment: Overall WFL for tasks assessed     Lower Extremity Assessment Lower Extremity Assessment: Overall WFL for tasks assessed;LLE deficits/detail LLE Deficits / Details: pt able to complete heel slide but limited by pain, quad strength 4/5 or greater with MMT LLE Sensation: WNL LLE Coordination: WNL    Cervical / Trunk Assessment Cervical / Trunk Assessment: Normal  Communication   Communication: No difficulties  Cognition Arousal/Alertness: Awake/alert Behavior During Therapy: WFL for tasks assessed/performed Overall Cognitive Status: Within Functional Limits for tasks assessed             General Comments      Exercises Total Joint Exercises Ankle Circles/Pumps: AROM;Seated;15 reps;Both   Assessment/Plan    PT Assessment Patient needs continued PT services  PT Problem List Decreased strength;Decreased balance;Decreased mobility;Decreased range of motion;Decreased activity tolerance;Decreased knowledge of use of DME       PT Treatment Interventions DME instruction;Functional mobility training;Gait training;Therapeutic activities;Therapeutic exercise;Stair training;Balance training;Patient/family education;Modalities    PT Goals (Current goals can be found in the Care Plan section)  Acute Rehab PT Goals Patient Stated Goal: to return home and get independent PT Goal Formulation: With patient Time For Goal Achievement: 10/09/19 Potential to Achieve Goals: Good    Frequency 7X/week    AM-PAC PT "6 Clicks" Mobility  Outcome Measure Help needed turning from your back to your side while in a flat bed without using bedrails?: A Little Help needed moving from lying on your back to sitting on the side of a flat bed without using bedrails?: A Little Help needed moving to and from a bed to a chair (including a wheelchair)?: A Little Help needed standing up from a chair using your arms (e.g., wheelchair or bedside chair)?: A Little Help needed to walk in hospital room?: A Little Help needed climbing 3-5 steps  with a railing? : A Little 6 Click Score: 18    End of Session Equipment Utilized During Treatment: Gait belt Activity Tolerance: Patient tolerated treatment well Patient left: in chair;with call bell/phone within reach;with chair alarm set Nurse Communication: Mobility status PT Visit Diagnosis: Muscle weakness (generalized) (M62.81);Difficulty in walking, not elsewhere classified (R26.2)    Time: [id number] PT Time Calculation (min) (ACUTE ONLY): 35 min   Charges:   PT Evaluation $PT Eval Low Complexity: 1 Low PT Treatments $Gait Training: 8-22 mins        Kipp Brood, PT, DPT Physical Therapist with [facility]  10/02/2019 3:12 PM

## 2019-10-02 NOTE — Anesthesia Procedure Notes (Signed)
Spinal  Patient location during procedure: OR Start time: 10/02/2019 8:56 AM End time: 10/02/2019 9:00 AM Staffing Resident/CRNA: Niel Hummer, CRNA Performed: resident/CRNA  Preanesthetic Checklist Completed: patient identified, surgical consent, pre-op evaluation, IV checked, risks and benefits discussed and monitors and equipment checked Spinal Block Patient position: sitting Prep: DuraPrep Patient monitoring: heart rate, continuous pulse ox and blood pressure Approach: midline Location: L3-4 Injection technique: single-shot Needle Needle type: Pencan  Needle gauge: 24 G

## 2019-10-02 NOTE — Op Note (Signed)
NAME:  Bryan Rivers NO.: [id number]      MEDICAL RECORD NO.: [id number]      FACILITY:  [facility]      PHYSICIAN:  [name]  DATE OF BIRTH:  30-Jul-1941     DATE OF PROCEDURE:  10/02/2019                                 OPERATIVE REPORT         PREOPERATIVE DIAGNOSIS: Left  hip osteoarthritis.      POSTOPERATIVE DIAGNOSIS:  Left hip osteoarthritis.      PROCEDURE:  Left total hip replacement through an anterior approach   utilizing DePuy THR system, component size 53mm pinnacle cup, a size 36+4 neutral   Altrex liner, a size 6 Hi Actis stem with a 36+5 Articuleze metal head ball.      SURGEON:  Pietro Cassis. Alvan Dame, M.D.      ASSISTANT:  Danae Orleans, PA-C     ANESTHESIA:  Spinal.      SPECIMENS:  None.      COMPLICATIONS:  None.      BLOOD LOSS:  600 cc     DRAINS:  None.      INDICATION OF THE PROCEDURE:  Bryan Rivers is a 78 y.o. male who had   presented to office for evaluation of left hip pain.  Radiographs revealed   progressive degenerative changes with bone-on-bone   articulation of the  hip joint, including subchondral cystic changes and osteophytes.  The patient had painful limited range of   motion significantly affecting their overall quality of life and function.  The patient was failing to    respond to conservative measures including medications and/or injections and activity modification and at this point was ready   to proceed with more definitive measures.  Consent was obtained for   benefit of pain relief.  Specific risks of infection, DVT, component   failure, dislocation, neurovascular injury, and need for revision surgery were reviewed in the office as well discussion of   the anterior versus posterior approach were reviewed.     PROCEDURE IN DETAIL:  The patient was brought to operative theater.   Once adequate anesthesia, preoperative antibiotics, 2 gm of Ancef, 1 gm of Tranexamic Acid, and 10 mg of  Decadron were administered, the patient was positioned supine on the [employer] table.  Once the patient was safely positioned with adequate padding of boney prominences we predraped out the hip, and used fluoroscopy to confirm orientation of the pelvis.      The left hip was then prepped and draped from proximal iliac crest to   mid thigh with a shower curtain technique.      Time-out was performed identifying the patient, planned procedure, and the appropriate extremity.     An incision was then made 2 cm lateral to the   anterior superior iliac spine extending over the orientation of the   tensor fascia lata muscle and sharp dissection was carried down to the   fascia of the muscle.      The fascia was then incised.  The muscle belly was identified and swept   laterally and retractor placed along the superior neck.  Following   cauterization of the circumflex vessels and removing some pericapsular   fat,  a second cobra retractor was placed on the inferior neck.  A T-capsulotomy was made along the line of the   superior neck to the trochanteric fossa, then extended proximally and   distally.  Tag sutures were placed and the retractors were then placed   intracapsular.  We then identified the trochanteric fossa and   orientation of my neck cut and then made a neck osteotomy with the femur on traction.  The femoral   head was removed without difficulty or complication.  Traction was let   off and retractors were placed posterior and anterior around the   acetabulum.      The labrum and foveal tissue were debrided.  I began reaming with a 45 mm   reamer and reamed up to 57 mm reamer with good bony bed preparation and a 58 mm  cup was chosen.  The final 58 mm Pinnacle cup was then impacted under fluoroscopy to confirm the depth of penetration and orientation with respect to   Abduction and forward flexion.  A screw was placed into the ilium followed by the hole eliminator.  The final   36+4  neutral Altrex liner was impacted with good visualized rim fit.  The cup was positioned anatomically within the acetabular portion of the pelvis.      At this point, the femur was rolled to 100 degrees.  Further capsule was   released off the inferior aspect of the femoral neck.  I then   released the superior capsule proximally.  With the leg in a neutral position the hook was placed laterally   along the femur under the vastus lateralis origin and elevated manually and then held in position using the hook attachment on the bed.  The leg was then extended and adducted with the leg rolled to 100   degrees of external rotation.  Retractors were placed along the medial calcar and posteriorly over the greater trochanter.  Once the proximal femur was fully   exposed, I used a box osteotome to set orientation.  I then began   broaching with the starting chili pepper broach and passed this by hand and then broached up to 6.  With the 6 broach in place I chose a high offset neck and did several trial reductions.  The offset was appropriate, leg lengths   appeared to be equal best matched with the +5 head ball trial confirmed radiographically.   Given these findings, I went ahead and dislocated the hip, repositioned all   retractors and positioned the right hip in the extended and abducted position.  The final 6 Hi Actis stem was   chosen and it was impacted down to the level of neck cut.  Based on this   and the trial reductions, a final 36+5 Articuleze metal head ball was chosen and   impacted onto a clean and dry trunnion, and the hip was reduced.  The   hip had been irrigated throughout the case again at this point.  I did   reapproximate the superior capsular leaflet to the anterior leaflet   using #1 Vicryl.  The fascia of the   tensor fascia lata muscle was then reapproximated using #1 Vicryl and #0 Stratafix sutures.  The   remaining wound was closed with 2-0 Vicryl and running 4-0 Monocryl.    The hip was cleaned, dried, and dressed sterilely using Dermabond and   Aquacel dressing.  The patient was then brought   to recovery room in  stable condition tolerating the procedure well.    Danae Orleans, PA-C was present for the entirety of the case involved from   preoperative positioning, perioperative retractor management, general   facilitation of the case, as well as primary wound closure as assistant.            Pietro Cassis Alvan Dame, M.D.        10/02/2019 9:14 AM

## 2019-10-02 NOTE — Plan of Care (Signed)
Plan of care reviewed and discussed with the patient. 

## 2019-10-02 NOTE — Transfer of Care (Signed)
Immediate Anesthesia Transfer of Care Note  Patient: Handy Flavin  Procedure(s) Performed: TOTAL HIP ARTHROPLASTY ANTERIOR APPROACH (Left Hip)  Patient Location: PACU  Anesthesia Type:Spinal  Level of Consciousness: awake  Airway & Oxygen Therapy: Patient Spontanous Breathing and Patient connected to face mask oxygen  Post-op Assessment: Report given to RN and Post -op Vital signs reviewed and stable  Post vital signs: Reviewed and stable  Last Vitals:  Vitals Value Taken Time  BP    Temp    Pulse 58 10/02/19 1048  Resp 15 10/02/19 1048  SpO2 96 % 10/02/19 1048  Vitals shown include unvalidated device data.  Last Pain:  Vitals:   10/02/19 0708  TempSrc:   PainSc: 7       Patients Stated Pain Goal: 5 ([id number])  Complications: No apparent anesthesia complications

## 2019-10-02 NOTE — Anesthesia Procedure Notes (Signed)
Procedure Name: MAC Date/Time: 10/02/2019 8:53 AM Performed by: Niel Hummer, CRNA Pre-anesthesia Checklist: Patient identified, Emergency Drugs available, Suction available and Patient being monitored Patient Re-evaluated:Patient Re-evaluated prior to induction Oxygen Delivery Method: Simple face mask

## 2019-10-02 NOTE — Progress Notes (Signed)
Physical Therapy Treatment Patient Details Name: Bryan Rivers MRN: [id number] DOB: 1941/09/01 Today's Date: 10/02/2019    History of Present Illness Patient is 78 y.o. male s/p Lt THA anterior approach with PMH significant for prostate cancer, PAD,HTN, HLD, and thrombosed left popliteal artery aneurysm post bypass graft on 02/21/19.    PT Comments    Pt up in chair and requesting assistance back to bed.  Pt assisted up from chair to walk short distance back to bed and assisted into bed.  Spouse present and with many questions asked and answered.   Follow Up Recommendations  Follow surgeon's recommendation for DC plan and follow-up therapies     Equipment Recommendations  None recommended by PT    Recommendations for Other Services       Precautions / Restrictions Precautions Precautions: Fall Restrictions Weight Bearing Restrictions: No Other Position/Activity Restrictions: WBAT    Mobility  Bed Mobility Overal bed mobility: Needs Assistance Bed Mobility: Sit to Supine     Supine to sit: HOB elevated;Min assist Sit to supine: Min assist   General bed mobility comments: cues for sequence and use of R LE to self assist  Transfers Overall transfer level: Needs assistance Equipment used: Rolling walker (2 wheeled) Transfers: Sit to/from Stand Sit to Stand: Min assist         General transfer comment: cues for LE management and use of UEs to self assist  Ambulation/Gait Ambulation/Gait assistance: Min assist;Min guard Gait Distance (Feet): 5 Feet Assistive device: Rolling walker (2 wheeled) Gait Pattern/deviations: Step-through pattern;Decreased step length - left;Decreased stride length;Decreased stance time - left Gait velocity: decreased   General Gait Details: cues for safe hand placement on RW during gait required intermittently and for safe step pattern at start, no overt LOB noted and pt progressed to min guard as he ambulated   Stairs              Wheelchair Mobility    Modified Rankin (Stroke Patients Only)       Balance Overall balance assessment: Needs assistance Sitting-balance support: Feet supported;No upper extremity supported Sitting balance-Leahy Scale: Good     Standing balance support: During functional activity;Bilateral upper extremity supported Standing balance-Leahy Scale: Fair                              Cognition Arousal/Alertness: Awake/alert Behavior During Therapy: WFL for tasks assessed/performed Overall Cognitive Status: Within Functional Limits for tasks assessed                                        Exercises Total Joint Exercises Ankle Circles/Pumps: AROM;Seated;15 reps;Both    General Comments        Pertinent Vitals/Pain Pain Assessment: 0-10 Pain Score: 3  Pain Location: Lt hip Pain Descriptors / Indicators: Sore Pain Intervention(s): Limited activity within patient's tolerance;Monitored during session;Premedicated before session    Home Living Family/patient expects to be discharged to:: Private residence Living Arrangements: Spouse/significant other Available Help at Discharge: Family;Available 24 hours/day Type of Home: House Home Access: Stairs to enter Entrance Stairs-Rails: None Home Layout: One level Home Equipment: Environmental consultant - 4 wheels;Walker - 2 wheels;Bedside commode;Cane - single point      Prior Function Level of Independence: Independent      Comments: Works as Chief Financial Officer, enjoys golf   PT Goals (current goals can now be  found in the care plan section) Acute Rehab PT Goals Patient Stated Goal: to return home and get independent PT Goal Formulation: With patient Time For Goal Achievement: 10/09/19 Potential to Achieve Goals: Good Progress towards PT goals: Progressing toward goals    Frequency    7X/week      PT Plan Current plan remains appropriate    Co-evaluation              AM-PAC PT "6 Clicks" Mobility    Outcome Measure  Help needed turning from your back to your side while in a flat bed without using bedrails?: A Little Help needed moving from lying on your back to sitting on the side of a flat bed without using bedrails?: A Little Help needed moving to and from a bed to a chair (including a wheelchair)?: A Little Help needed standing up from a chair using your arms (e.g., wheelchair or bedside chair)?: A Little Help needed to walk in hospital room?: A Little Help needed climbing 3-5 steps with a railing? : A Little 6 Click Score: 18    End of Session Equipment Utilized During Treatment: Gait belt Activity Tolerance: Patient tolerated treatment well Patient left: in bed;with call bell/phone within reach;with bed alarm set;with family/visitor present Nurse Communication: Mobility status PT Visit Diagnosis: Muscle weakness (generalized) (M62.81);Difficulty in walking, not elsewhere classified (R26.2)     Time: [id number] PT Time Calculation (min) (ACUTE ONLY): 18 min  Charges:  $Gait Training: 8-22 mins $Therapeutic Activity: 8-22 mins                     Debe Coder PT Acute Rehabilitation Services Pager [phone] Office [phone]    [name] 10/02/2019, 4:44 PM

## 2019-10-02 NOTE — Anesthesia Postprocedure Evaluation (Signed)
Anesthesia Post Note  Patient: Bryan Rivers  Procedure(s) Performed: TOTAL HIP ARTHROPLASTY ANTERIOR APPROACH (Left Hip)     Patient location during evaluation: PACU Anesthesia Type: Spinal Level of consciousness: awake and alert Pain management: pain level controlled Vital Signs Assessment: post-procedure vital signs reviewed and stable Respiratory status: spontaneous breathing, nonlabored ventilation, respiratory function stable and patient connected to nasal cannula oxygen Cardiovascular status: blood pressure returned to baseline and stable Postop Assessment: no apparent nausea or vomiting, spinal receding, no headache and no backache Anesthetic complications: no    Last Vitals:  Vitals:   10/02/19 1443 10/02/19 1537  BP: 119/81 126/77  Pulse: 79 78  Resp: 16   Temp: 36.8 C 36.4 C  SpO2: 94% 93%    Last Pain:  Vitals:   10/02/19 1537  TempSrc: Oral  PainSc:                  [name]

## 2019-10-03 ENCOUNTER — Encounter (HOSPITAL_COMMUNITY): Payer: Self-pay | Admitting: Orthopedic Surgery

## 2019-10-03 DIAGNOSIS — M1612 Unilateral primary osteoarthritis, left hip: Secondary | ICD-10-CM | POA: Diagnosis not present

## 2019-10-03 LAB — CBC
HCT: 34.4 % — ABNORMAL LOW (ref 39.0–52.0)
Hemoglobin: 11.3 g/dL — ABNORMAL LOW (ref 13.0–17.0)
MCH: 28.6 pg (ref 26.0–34.0)
MCHC: 32.8 g/dL (ref 30.0–36.0)
MCV: 87.1 fL (ref 80.0–100.0)
Platelets: 163 10*3/uL (ref 150–400)
RBC: 3.95 MIL/uL — ABNORMAL LOW (ref 4.22–5.81)
RDW: 12.9 % (ref 11.5–15.5)
WBC: 9.2 10*3/uL (ref 4.0–10.5)
nRBC: 0 % (ref 0.0–0.2)

## 2019-10-03 LAB — BASIC METABOLIC PANEL
Anion gap: 10 (ref 5–15)
BUN: 16 mg/dL (ref 8–23)
CO2: 23 mmol/L (ref 22–32)
Calcium: 8.8 mg/dL — ABNORMAL LOW (ref 8.9–10.3)
Chloride: 105 mmol/L (ref 98–111)
Creatinine, Ser: 0.81 mg/dL (ref 0.61–1.24)
GFR calc Af Amer: >60 mL/min (ref >60)
GFR calc non Af Amer: >60 mL/min (ref >60)
Glucose, Bld: 138 mg/dL — ABNORMAL HIGH (ref 70–99)
Potassium: 3.9 mmol/L (ref 3.5–5.1)
Sodium: 138 mmol/L (ref 135–145)

## 2019-10-03 MED ORDER — METHOCARBAMOL 500 MG PO TABS: 500.0000 mg | ORAL_TABLET | Freq: Four times a day (QID) | ORAL | 0 refills | Status: DC | PRN

## 2019-10-03 MED ORDER — DOCUSATE SODIUM 100 MG PO CAPS: 100.0000 mg | ORAL_CAPSULE | Freq: Two times a day (BID) | ORAL | 0 refills | Status: DC

## 2019-10-03 MED ORDER — POLYETHYLENE GLYCOL 3350 17 G PO PACK: 17.0000 g | PACK | Freq: Two times a day (BID) | ORAL | 0 refills | Status: DC

## 2019-10-03 MED ORDER — ASPIRIN 81 MG PO CHEW: 81.0000 mg | CHEWABLE_TABLET | Freq: Two times a day (BID) | ORAL | 0 refills | Status: AC

## 2019-10-03 MED ORDER — FERROUS SULFATE 325 (65 FE) MG PO TABS: 325.0000 mg | ORAL_TABLET | Freq: Three times a day (TID) | ORAL | 0 refills | Status: DC

## 2019-10-03 MED ORDER — HYDROCODONE-ACETAMINOPHEN 7.5-325 MG PO TABS: 1.0000 | ORAL_TABLET | ORAL | 0 refills | Status: DC | PRN

## 2019-10-03 NOTE — Progress Notes (Signed)
Physical Therapy Treatment Patient Details Name: Bryan Rivers MRN: [id number] DOB: 1941-03-11 Today's Date: 10/03/2019    History of Present Illness Patient is 78 y.o. male s/p Lt THA anterior approach with PMH significant for prostate cancer, PAD,HTN, HLD, and thrombosed left popliteal artery aneurysm post bypass graft on 02/21/19.    PT Comments    Pt continues motivated and progressing well with mobility.  Spouse present to review car transfers, stairs and home therex program - written instruction provided and reviewed.   Follow Up Recommendations  Follow surgeon's recommendation for DC plan and follow-up therapies     Equipment Recommendations  None recommended by PT    Recommendations for Other Services       Precautions / Restrictions Precautions Precautions: Fall Restrictions Weight Bearing Restrictions: No Other Position/Activity Restrictions: WBAT    Mobility  Bed Mobility Overal bed mobility: Needs Assistance Bed Mobility: Supine to Sit;Sit to Supine     Supine to sit: Min guard;Supervision Sit to supine: Min guard;Supervision   General bed mobility comments: Incresaed time with cues for sequence and use of R LE to self assist  Transfers Overall transfer level: Needs assistance Equipment used: Rolling walker (2 wheeled) Transfers: Sit to/from Stand Sit to Stand: Min guard;Supervision         General transfer comment: cues for LE management and use of UEs to self assist  Ambulation/Gait Ambulation/Gait assistance: Min guard;Supervision Gait Distance (Feet): 150 Feet Assistive device: Rolling walker (2 wheeled) Gait Pattern/deviations: Step-through pattern;Decreased step length - left;Decreased stride length;Decreased stance time - left Gait velocity: decreased   General Gait Details: cues for posture, position from RW and initial sequence   Stairs Stairs: Yes Stairs assistance: Min guard Stair Management: No rails;Step to  pattern;Backwards;Forwards Number of Stairs: 2 General stair comments: single step fwd and bkwd with RW and cues for sequence   Wheelchair Mobility    Modified Rankin (Stroke Patients Only)       Balance Overall balance assessment: Needs assistance Sitting-balance support: Feet supported;No upper extremity supported Sitting balance-Leahy Scale: Good     Standing balance support: During functional activity;Bilateral upper extremity supported Standing balance-Leahy Scale: Fair                              Cognition Arousal/Alertness: Awake/alert Behavior During Therapy: WFL for tasks assessed/performed Overall Cognitive Status: Within Functional Limits for tasks assessed                                        Exercises Total Joint Exercises Ankle Circles/Pumps: AROM;Seated;15 reps;Both Quad Sets: AROM;Both;10 reps;Supine Heel Slides: AAROM;Left;20 reps;Supine Hip ABduction/ADduction: AAROM;Left;15 reps;Supine Long Arc Quad: AAROM;AROM;Left;10 reps;Seated    General Comments        Pertinent Vitals/Pain Pain Assessment: 0-10 Pain Score: 3  Pain Location: Lt hip Pain Descriptors / Indicators: Sore Pain Intervention(s): Limited activity within patient's tolerance;Monitored during session    Home Living                      Prior Function            PT Goals (current goals can now be found in the care plan section) Acute Rehab PT Goals Patient Stated Goal: to return home and get independent PT Goal Formulation: With patient Time For Goal Achievement: 10/09/19 Potential to Achieve Goals:  Good Progress towards PT goals: Progressing toward goals    Frequency    7X/week      PT Plan Current plan remains appropriate    Co-evaluation              AM-PAC PT "6 Clicks" Mobility   Outcome Measure  Help needed turning from your back to your side while in a flat bed without using bedrails?: A Little Help needed  moving from lying on your back to sitting on the side of a flat bed without using bedrails?: A Little Help needed moving to and from a bed to a chair (including a wheelchair)?: A Little Help needed standing up from a chair using your arms (e.g., wheelchair or bedside chair)?: A Little Help needed to walk in hospital room?: A Little Help needed climbing 3-5 steps with a railing? : A Little 6 Click Score: 18    End of Session Equipment Utilized During Treatment: Gait belt Activity Tolerance: Patient tolerated treatment well Patient left: in chair;with call bell/phone within reach;with chair alarm set Nurse Communication: Mobility status PT Visit Diagnosis: Muscle weakness (generalized) (M62.81);Difficulty in walking, not elsewhere classified (R26.2)     Time: 1341-1420 PT Time Calculation (min) (ACUTE ONLY): 39 min  Charges:  $Gait Training: 8-22 mins $Therapeutic Exercise: 8-22 mins $Therapeutic Activity: 8-22 mins                     Debe Coder PT Acute Rehabilitation Services Pager [phone] Office [phone]    Bayle Calvo 10/03/2019, 2:39 PM

## 2019-10-03 NOTE — Progress Notes (Signed)
     Subjective: 1 Day Post-Op Procedure(s) (LRB): TOTAL HIP ARTHROPLASTY ANTERIOR APPROACH (Left)   Patient reports pain as mild, pain controlled.  No events throughout the night.  Dr. Alvan Dame discussed the procedure, findings and expectations moving forward. Ready to be discharged home.    Objective:   VITALS:   Vitals:   10/03/19 0125 10/03/19 0550  BP: (!) 146/81 119/62  Pulse: 70 66  Resp: 14 14  Temp: 98.3 F (36.8 C) 98.1 F (36.7 C)  SpO2: 92% 95%    Dorsiflexion/Plantar flexion intact Incision: dressing C/D/I No cellulitis present Compartment soft  LABS Recent Labs    10/03/19 0227  HGB 11.3*  HCT 34.4*  WBC 9.2  PLT 163    Recent Labs    10/03/19 0227  NA 138  K 3.9  BUN 16  CREATININE 0.81  GLUCOSE 138*     Assessment/Plan: 1 Day Post-Op Procedure(s) (LRB): TOTAL HIP ARTHROPLASTY ANTERIOR APPROACH (Left) Foley cath d/c'ed Advance diet Up with therapy D/C IV fluids Discharge home  Follow up in 2 weeks at [facility] Follow up with OLIN,Phuong Hillary D in 2 weeks.  Contact information:  [facility] [address], [address] [place]   PAC  10/03/2019, 8:03 AM

## 2019-10-03 NOTE — Progress Notes (Signed)
Physical Therapy Treatment Patient Details Name: Bryan Rivers MRN: [id number] DOB: 08-19-1941 Today's Date: 10/03/2019    History of Present Illness Patient is 78 y.o. male s/p Lt THA anterior approach with PMH significant for prostate cancer, PAD,HTN, HLD, and thrombosed left popliteal artery aneurysm post bypass graft on 02/21/19.    PT Comments    Pt motivated, progressing well with mobility and eager for dc home this date.   Follow Up Recommendations  Follow surgeon's recommendation for DC plan and follow-up therapies     Equipment Recommendations  None recommended by PT    Recommendations for Other Services       Precautions / Restrictions Precautions Precautions: Fall Restrictions Weight Bearing Restrictions: No Other Position/Activity Restrictions: WBAT    Mobility  Bed Mobility Overal bed mobility: Needs Assistance Bed Mobility: Supine to Sit       Sit to supine: Min guard   General bed mobility comments: Incresaed time with cues for sequence and use of R LE to self assist  Transfers Overall transfer level: Needs assistance Equipment used: Rolling walker (2 wheeled) Transfers: Sit to/from Stand Sit to Stand: Min guard         General transfer comment: cues for LE management and use of UEs to self assist  Ambulation/Gait Ambulation/Gait assistance: Min guard Gait Distance (Feet): 150 Feet Assistive device: Rolling walker (2 wheeled) Gait Pattern/deviations: Step-through pattern;Decreased step length - left;Decreased stride length;Decreased stance time - left Gait velocity: decreased   General Gait Details: cues for posture, position from RW and initial sequence   Stairs             Wheelchair Mobility    Modified Rankin (Stroke Patients Only)       Balance Overall balance assessment: Needs assistance Sitting-balance support: Feet supported;No upper extremity supported Sitting balance-Leahy Scale: Good     Standing balance  support: During functional activity;Bilateral upper extremity supported Standing balance-Leahy Scale: Fair                              Cognition Arousal/Alertness: Awake/alert Behavior During Therapy: WFL for tasks assessed/performed Overall Cognitive Status: Within Functional Limits for tasks assessed                                        Exercises Total Joint Exercises Ankle Circles/Pumps: AROM;Seated;15 reps;Both Quad Sets: AROM;Both;10 reps;Supine Heel Slides: AAROM;Left;20 reps;Supine Hip ABduction/ADduction: AAROM;Left;15 reps;Supine Long Arc Quad: AAROM;AROM;Left;10 reps;Seated    General Comments        Pertinent Vitals/Pain Pain Assessment: 0-10 Pain Score: 3  Pain Location: Lt hip Pain Descriptors / Indicators: Sore Pain Intervention(s): Limited activity within patient's tolerance;Monitored during session;Premedicated before session;Ice applied    Home Living                      Prior Function            PT Goals (current goals can now be found in the care plan section) Acute Rehab PT Goals Patient Stated Goal: to return home and get independent PT Goal Formulation: With patient Time For Goal Achievement: 10/09/19 Potential to Achieve Goals: Good Progress towards PT goals: Progressing toward goals    Frequency    7X/week      PT Plan Current plan remains appropriate    Co-evaluation  AM-PAC PT "6 Clicks" Mobility   Outcome Measure  Help needed turning from your back to your side while in a flat bed without using bedrails?: A Little Help needed moving from lying on your back to sitting on the side of a flat bed without using bedrails?: A Little Help needed moving to and from a bed to a chair (including a wheelchair)?: A Little Help needed standing up from a chair using your arms (e.g., wheelchair or bedside chair)?: A Little Help needed to walk in hospital room?: A Little Help needed  climbing 3-5 steps with a railing? : A Little 6 Click Score: 18    End of Session Equipment Utilized During Treatment: Gait belt Activity Tolerance: Patient tolerated treatment well Patient left: in chair;with call bell/phone within reach;with chair alarm set Nurse Communication: Mobility status PT Visit Diagnosis: Muscle weakness (generalized) (M62.81);Difficulty in walking, not elsewhere classified (R26.2)     Time: [id number] PT Time Calculation (min) (ACUTE ONLY): 44 min  Charges:  $Gait Training: 8-22 mins $Therapeutic Exercise: 8-22 mins $Therapeutic Activity: 8-22 mins                     Debe Coder PT Acute Rehabilitation Services Pager [phone] Office [phone]    Aiva Miskell 10/03/2019, 12:41 PM

## 2019-10-09 NOTE — Discharge Summary (Signed)
Physician Discharge Summary  Patient ID: Bryan Rivers MRN: [id number] DOB/AGE: [birth year] 78 y.o.  Admit date: 10/02/2019 Discharge date: 10/03/2019   Procedures:  Procedure(s) (LRB): TOTAL HIP ARTHROPLASTY ANTERIOR APPROACH (Left)  Attending Physician:  Dr. Paralee Cancel   Admission Diagnoses:   Left hip primary OA / pain  Discharge Diagnoses:  Active Problems:   S/P left THA, AA   Status post total hip replacement, left  Past Medical History:  Diagnosis Date   Aneurysm of left popliteal artery ([place]) 01/31/2019   thrombosed left popliteal artery aneurysm   ED (erectile dysfunction)    GERD (gastroesophageal reflux disease)    Hyperlipemia    Hypertension    Morbid obesity ([place]) 02/11/2013   PAD (peripheral artery disease) (HCC)    Prostate cancer (HCC)     HPI:    Bryan Rivers, 78 y.o. male, has a history of pain and functional disability in the left hip(s) due to arthritis and patient has failed non-surgical conservative treatments for greater than 12 weeks to include NSAID's and/or analgesics and activity modification.  Onset of symptoms was gradual starting in January 2020  with rapidlly worsening course since that time.The patient noted no past surgery on the left hip(s).  Patient currently rates pain in the left hip at 8 out of 10 with activity. Patient has worsening of pain with activity and weight bearing, trendelenberg gait, pain that interfers with activities of daily living and pain with passive range of motion. Patient has evidence of periarticular osteophytes and joint space narrowing by imaging studies. This condition presents safety issues increasing the risk of falls. There is no current active infection.  Risks, benefits and expectations were discussed with the patient.  Risks including but not limited to the risk of anesthesia, blood clots, nerve damage, blood vessel damage, failure of the prosthesis, infection and up to and including death.  Patient  understand the risks, benefits and expectations and wishes to proceed with surgery.   PCP: Shelda Pal, DO   Discharged Condition: good  Hospital Course:  Patient underwent the above stated procedure on 10/02/2019. Patient tolerated the procedure well and brought to the recovery room in good condition and subsequently to the floor.  POD #1 BP: 119/62 ; Pulse: 66 ; Temp: 98.1 F (36.7 C) ; Resp: 14 Patient reports pain as mild, pain controlled.  No events throughout the night.  Dr. Alvan Dame discussed the procedure, findings and expectations moving forward. Ready to be discharged home.  Dorsiflexion/plantar flexion intact, incision: dressing C/D/I, no cellulitis present and compartment soft.   LABS  Basename    HGB     11.3  HCT     34.4    Discharge Exam: General appearance: alert, cooperative and no distress Extremities: Homans sign is negative, no sign of DVT, no edema, redness or tenderness in the calves or thighs and no ulcers, gangrene or trophic changes  Disposition:  Home with follow up in 2 weeks   Follow-up Information    Paralee Cancel, MD. Schedule an appointment as soon as possible for a visit in 2 weeks.   Specialty: Orthopedic Surgery Contact information: [address] [place] [address] [id number]           Discharge Instructions    Call MD / Call 911   Complete by: As directed    If you experience chest pain or shortness of breath, CALL 911 and be transported to the hospital emergency room.  If you develope a  fever above 101 F, pus (white drainage) or increased drainage or redness at the wound, or calf pain, call your surgeon's office.   Change dressing   Complete by: As directed    Maintain surgical dressing until follow up in the clinic. If the edges start to pull up, may reinforce with tape. If the dressing is no longer working, may remove and cover with gauze and tape, but must keep the area dry and clean.  Call with any  questions or concerns.   Constipation Prevention   Complete by: As directed    Drink plenty of fluids.  Prune juice may be helpful.  You may use a stool softener, such as Colace (over the counter) 100 mg twice a day.  Use MiraLax (over the counter) for constipation as needed.   Diet - low sodium heart healthy   Complete by: As directed    Discharge instructions   Complete by: As directed    Maintain surgical dressing until follow up in the clinic. If the edges start to pull up, may reinforce with tape. If the dressing is no longer working, may remove and cover with gauze and tape, but must keep the area dry and clean.  Follow up in 2 weeks at [facility]. Call with any questions or concerns.   Increase activity slowly as tolerated   Complete by: As directed    Weight bearing as tolerated with assist device (walker, cane, etc) as directed, use it as long as suggested by your surgeon or therapist, typically at least 4-6 weeks.   TED hose   Complete by: As directed    Use stockings (TED hose) for 2 weeks on both leg(s).  You may remove them at night for sleeping.      Allergies as of 10/03/2019   No Known Allergies     Medication List    STOP taking these medications   aspirin 81 MG EC tablet Replaced by: aspirin 81 MG chewable tablet     TAKE these medications   aspirin 81 MG chewable tablet Commonly known as: Aspirin Childrens Chew 1 tablet (81 mg total) by mouth 2 (two) times daily. Take for 4 weeks, then resume regular dose. Replaces: aspirin 81 MG EC tablet   docusate sodium 100 MG capsule Commonly known as: Colace Take 1 capsule (100 mg total) by mouth 2 (two) times daily.   ferrous sulfate 325 (65 FE) MG tablet Commonly known as: FerrouSul Take 1 tablet (325 mg total) by mouth 3 (three) times daily with meals for 14 days.   HYDROcodone-acetaminophen 7.5-325 MG tablet Commonly known as: Norco Take 1-2 tablets by mouth every 4 (four) hours as needed for  moderate pain.   Lubricant Eye Drops 0.4-0.3 % Soln Generic drug: Polyethyl Glycol-Propyl Glycol Place 1 drop into both eyes 3 (three) times daily as needed (dry/irritated eyes.).   methocarbamol 500 MG tablet Commonly known as: Robaxin Take 1 tablet (500 mg total) by mouth every 6 (six) hours as needed for muscle spasms.   olmesartan-hydrochlorothiazide 40-25 MG tablet Commonly known as: BENICAR HCT Take 1 tablet by mouth every evening.   polyethylene glycol 17 g packet Commonly known as: MIRALAX / GLYCOLAX Take 17 g by mouth 2 (two) times daily.   rosuvastatin 20 MG tablet Commonly known as: CRESTOR Take 1 tablet (20 mg total) by mouth daily.            Discharge Care Instructions  (From admission, onward)  Start     Ordered   10/03/19 0000  Change dressing    Comments: Maintain surgical dressing until follow up in the clinic. If the edges start to pull up, may reinforce with tape. If the dressing is no longer working, may remove and cover with gauze and tape, but must keep the area dry and clean.  Call with any questions or concerns.   10/03/19 0804           Signed: [place] Pugh. Marik Sedore   PA-C  10/09/2019, 8:24 AM

## 2019-10-22 ENCOUNTER — Ambulatory Visit: Payer: Medicare Other | Admitting: Vascular Surgery

## 2019-10-22 ENCOUNTER — Encounter (HOSPITAL_COMMUNITY): Payer: Medicare Other

## 2019-10-22 ENCOUNTER — Other Ambulatory Visit (HOSPITAL_COMMUNITY): Payer: Medicare Other

## 2020-02-16 ENCOUNTER — Other Ambulatory Visit: Payer: Self-pay | Admitting: Family Medicine

## 2020-02-16 MED ORDER — ROSUVASTATIN CALCIUM 20 MG PO TABS: 20.0000 mg | ORAL_TABLET | Freq: Every day | ORAL | 3 refills | Status: DC

## 2020-03-31 ENCOUNTER — Encounter (HOSPITAL_COMMUNITY): Payer: Medicare Other

## 2020-03-31 ENCOUNTER — Ambulatory Visit: Payer: Medicare Other | Admitting: Vascular Surgery

## 2020-03-31 ENCOUNTER — Other Ambulatory Visit (HOSPITAL_COMMUNITY): Payer: Medicare Other

## 2020-04-05 MED ORDER — OLMESARTAN MEDOXOMIL-HCTZ 40-25 MG PO TABS: 1.0000 | ORAL_TABLET | Freq: Every evening | ORAL | 1 refills | Status: DC

## 2020-05-03 ENCOUNTER — Ambulatory Visit: Payer: Medicare Other | Admitting: *Deleted

## 2020-09-21 ENCOUNTER — Other Ambulatory Visit: Payer: Self-pay | Admitting: Family Medicine

## 2020-09-21 MED ORDER — OLMESARTAN MEDOXOMIL-HCTZ 40-25 MG PO TABS: 1.0000 | ORAL_TABLET | Freq: Every evening | ORAL | 1 refills | Status: DC

## 2020-11-02 ENCOUNTER — Other Ambulatory Visit: Payer: Self-pay | Admitting: Family Medicine

## 2020-11-08 ENCOUNTER — Ambulatory Visit (INDEPENDENT_AMBULATORY_CARE_PROVIDER_SITE_OTHER): Payer: Medicare Other

## 2020-11-08 ENCOUNTER — Ambulatory Visit (INDEPENDENT_AMBULATORY_CARE_PROVIDER_SITE_OTHER): Payer: Medicare Other | Admitting: Orthopedic Surgery

## 2020-11-08 DIAGNOSIS — M25512 Pain in left shoulder: Secondary | ICD-10-CM | POA: Diagnosis not present

## 2020-11-08 DIAGNOSIS — M19012 Primary osteoarthritis, left shoulder: Secondary | ICD-10-CM

## 2020-11-09 ENCOUNTER — Encounter: Payer: Self-pay | Admitting: Orthopedic Surgery

## 2020-11-09 DIAGNOSIS — M19012 Primary osteoarthritis, left shoulder: Secondary | ICD-10-CM

## 2020-11-09 MED ORDER — LIDOCAINE HCL 1 % IJ SOLN
5.0000 mL | INTRAMUSCULAR | Status: AC | PRN
  Administered 2020-11-09: 5 mL

## 2020-11-09 MED ORDER — BUPIVACAINE HCL 0.5 % IJ SOLN
9.0000 mL | INTRAMUSCULAR | Status: AC | PRN
  Administered 2020-11-09: 9 mL via INTRA_ARTICULAR

## 2020-11-09 MED ORDER — METHYLPREDNISOLONE ACETATE 40 MG/ML IJ SUSP
40.0000 mg | INTRAMUSCULAR | Status: AC | PRN
  Administered 2020-11-09: 40 mg via INTRA_ARTICULAR

## 2020-11-09 NOTE — Progress Notes (Signed)
Office Visit Note   Patient: Bryan Rivers           Date of Birth: Nov 20, 1941           MRN: [id number] Visit Date: 11/08/2020 Requested by: Shelda Pal, [address] [place],  [place] [address] PCP: Shelda Pal, DO  Subjective: Chief Complaint  Patient presents with  . Left Shoulder - Pain    HPI: Bryan Rivers is a 79 year old patient with left shoulder pain.  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  He is very active and playing handball tennis and other sports when he was growing up.  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  This was also very physical work.  Describes mild shoulder pain.  He has had injections in the past which give him reasonable relief.  Hard for him to sleep on that left-hand side but overall he manages very well with his left shoulder pain.              ROS: All systems reviewed are negative as they relate to the chief complaint within the history of present illness.  Patient denies  fevers or chills.   Assessment & Plan: Visit Diagnoses:  1. Left shoulder pain, unspecified chronicity     Plan: Impression is left shoulder arthritis with fairly significant posterior glenoid erosion.  He would need reverse shoulder replacement if and when he ever gets to the point where his shoulder becomes untenable.  Currently he is playing golf 3 times a week and working out 3 times a week.  He has forward flexion and abduction both above 90 degrees.  He has not lost as much motion as the radiographs would suggest.  Glenohumeral joint injection performed today.  We could do that in about 4 to 6 months as pain requires.  Overall he is managing exceedingly well with the amount of arthritis he has in the shoulder joint.  Follow-Up Instructions: Return in about 4 months (around 03/09/2021).   Orders:  Orders Placed This Encounter  Procedures  . XR Shoulder Left   No orders of  the defined types were placed in this encounter.     Procedures: Large Joint Inj: L glenohumeral on 11/09/2020 11:32 AM Indications: diagnostic evaluation and pain Details: 18 G 1.5 in needle, posterior approach  Arthrogram: No  Medications: 9 mL bupivacaine 0.5 %; 40 mg methylPREDNISolone acetate 40 MG/ML; 5 mL lidocaine 1 % Outcome: tolerated well, no immediate complications Procedure, treatment alternatives, risks and benefits explained, specific risks discussed. Consent was given by the patient. Immediately prior to procedure a time out was called to verify the correct patient, procedure, equipment, support staff and site/side marked as required. Patient was prepped and draped in the usual sterile fashion.       Clinical Data: No additional findings.  Objective: Vital Signs: There were no vitals taken for this visit.  Physical Exam:   Constitutional: Patient appears well-developed HEENT:  Head: Normocephalic Eyes:EOM are normal Neck: Normal range of motion Cardiovascular: Normal rate Pulmonary/chest: Effort normal Neurologic: Patient is alert Skin: Skin is warm Psychiatric: Patient has normal mood and affect    Ortho Exam: Ortho exam demonstrates good cervical spine range of motion.  No paresthesias C5-T1.  External rotation left shoulder 15 degrees of abduction is about 35 degrees compared to 55 on the right.  Forward flexion abduction both about 20 degrees  above 90.  Rotator cuff strength is good infraspinatus supraspinatus and subscap muscle testing.  Does have a little of coarseness grinding with left shoulder passive and active range of motion.  Right shoulder has good range of motion more than the left-hand side as well as good strength.  Specialty Comments:  No specialty comments available.  Imaging: XR Shoulder Left  Result Date: 11/09/2020 AP axillary lateral outlet views left shoulder reviewed.  Severe end-stage glenohumeral arthritis is present.   Acromiohumeral distance is maintained.  Severe posterior glenoid erosion is present making glenoid type B2 at least.  No acute fracture.    PMFS History: Patient Active Problem List   Diagnosis Date Noted  . S/P left THA, AA 10/02/2019  . Status post total hip replacement, left 10/02/2019  . PAD (peripheral artery disease) ([place]) 02/21/2019  . Arthritis of left hip 01/23/2019  . Chronic left shoulder pain 12/26/2018  . Chronic pain of left knee 12/26/2018  . Lower back injury, initial encounter 01/02/2017  . Left wrist injury 11/09/2015  . Left rib fracture 11/09/2015  . Morbid obesity ([place]) 02/11/2013  . Hyperglycemia 11/04/2012  . Essential hypertension 09/05/2011  . Hyperlipidemia 09/05/2011  . Malignant neoplasm of prostate ([place]) 09/05/2011   Past Medical History:  Diagnosis Date  . Aneurysm of left popliteal artery ([place]) 01/31/2019   thrombosed left popliteal artery aneurysm  . ED (erectile dysfunction)   . GERD (gastroesophageal reflux disease)   . Hyperlipemia   . Hypertension   . Morbid obesity ([place]) 02/11/2013  . PAD (peripheral artery disease) ([place])   . Prostate cancer [facility])     Family History  Problem Relation Age of Onset  . Heart disease Mother   . Diabetes Mother     Past Surgical History:  Procedure Laterality Date  . ABDOMINAL AORTOGRAM W/LOWER EXTREMITY Bilateral 01/31/2019   Procedure: ABDOMINAL AORTOGRAM W/LOWER EXTREMITY;  Surgeon: Angelia Mould, MD;  Location: [facility];  Service: Cardiovascular;  Laterality: Bilateral;  . APPENDECTOMY  1950   age 31  . BYPASS GRAFT FEMORAL-PERONEAL Left 02/21/2019   Procedure: BYPASS GRAFT FEMORAL-PERONEAL WITH VEIN GRAFT;  Surgeon: Angelia Mould, MD;  Location: [facility];  Service: Vascular;  Laterality: Left;  . COLONOSCOPY    . TOTAL HIP ARTHROPLASTY Left 10/02/2019   Procedure: TOTAL HIP ARTHROPLASTY ANTERIOR APPROACH;  Surgeon: Paralee Cancel, MD;  [facility];  Service: Orthopedics;   Laterality: Left;  70 mins  . WRIST SURGERY Left 2015   Social History   Occupational History  . Not on file  Tobacco Use  . Smoking status: Never Smoker  . Smokeless tobacco: Never Used  Vaping Use  . Vaping Use: Never used  Substance and Sexual Activity  . Alcohol use: Not Currently    Alcohol/week: 0.0 standard drinks    Comment: occ wine  . Drug use: No  . Sexual activity: Not on file

## 2020-11-16 ENCOUNTER — Ambulatory Visit (INDEPENDENT_AMBULATORY_CARE_PROVIDER_SITE_OTHER): Payer: Medicare Other | Admitting: Family Medicine

## 2020-11-16 ENCOUNTER — Encounter: Payer: Self-pay | Admitting: Family Medicine

## 2020-11-16 ENCOUNTER — Other Ambulatory Visit: Payer: Self-pay

## 2020-11-16 VITALS — BP 128/80 | HR 77 | Temp 97.9°F | Ht 71.0 in | Wt 229.2 lb

## 2020-11-16 DIAGNOSIS — I10 Essential (primary) hypertension: Secondary | ICD-10-CM

## 2020-11-16 DIAGNOSIS — E785 Hyperlipidemia, unspecified: Secondary | ICD-10-CM | POA: Diagnosis not present

## 2020-11-16 DIAGNOSIS — Z1159 Encounter for screening for other viral diseases: Secondary | ICD-10-CM

## 2020-11-16 LAB — COMPREHENSIVE METABOLIC PANEL
ALT: 33 U/L (ref 0–53)
AST: 20 U/L (ref 0–37)
Albumin: 4.3 g/dL (ref 3.5–5.2)
Alkaline Phosphatase: 56 U/L (ref 39–117)
BUN: 22 mg/dL (ref 6–23)
CO2: 31 mEq/L (ref 19–32)
Calcium: 9.5 mg/dL (ref 8.4–10.5)
Chloride: 100 mEq/L (ref 96–112)
Creatinine, Ser: 1.1 mg/dL (ref 0.40–1.50)
GFR: 64.8 mL/min (ref >60.00)
Glucose, Bld: 93 mg/dL (ref 70–99)
Potassium: 5 mEq/L (ref 3.5–5.1)
Sodium: 137 mEq/L (ref 135–145)
Total Bilirubin: 0.5 mg/dL (ref 0.2–1.2)
Total Protein: 6.9 g/dL (ref 6.0–8.3)

## 2020-11-16 LAB — LIPID PANEL
Cholesterol: 158 mg/dL (ref 0–200)
HDL: 57.1 mg/dL (ref >39.00)
LDL Cholesterol: 83 mg/dL (ref 0–99)
NonHDL: 101.07
Total CHOL/HDL Ratio: 3
Triglycerides: 89 mg/dL (ref 0.0–149.0)
VLDL: 17.8 mg/dL (ref 0.0–40.0)

## 2020-11-16 LAB — HEPATITIS C ANTIBODY
Hepatitis C Ab: NONREACTIVE
SIGNAL TO CUT-OFF: 0.01 (ref <1.00)

## 2020-11-16 MED ORDER — ROSUVASTATIN CALCIUM 40 MG PO TABS: ORAL_TABLET | ORAL | 2 refills | Status: DC

## 2020-11-16 NOTE — Patient Instructions (Signed)
Keep the diet clean and stay active.  Give us 2-3 business days to get the results of your labs back.   Because your blood pressure is well-controlled, you no longer have to check your blood pressure at home anymore unless you wish. Some people check it twice daily every day and some people stop altogether. Either or anything in between is fine. Strong work!  Let us know if you need anything.  

## 2020-11-16 NOTE — Progress Notes (Signed)
Chief Complaint  Patient presents with  . Follow-up    Subjective Bryan Rivers is a 79 y.o. male who presents for hypertension follow up. He does monitor home blood pressures. Blood pressures ranging from 130's/70-80's on average. He is compliant with medications- Benicar HCTZ 40-25 mg/d. Patient has these side effects of medication: freq urination He is adhering to a healthy diet overall. Current exercise: golfing, cardio, wt lifting No CP or SOB.   Hyperlipidemia Patient presents for dyslipidemia follow up. Currently being treated with Crestor 20 mg/d and compliance with treatment thus far has been good. He denies myalgias. Diet/exercise as above.  The patient is not known to have coexisting coronary artery disease.   Past Medical History:  Diagnosis Date  . Aneurysm of left popliteal artery ([place]) 01/31/2019   thrombosed left popliteal artery aneurysm  . ED (erectile dysfunction)   . GERD (gastroesophageal reflux disease)   . Hyperlipemia   . Hypertension   . Morbid obesity ([place]) 02/11/2013  . PAD (peripheral artery disease) ([place])   . Prostate cancer ([place])     Exam BP 128/80 (BP Location: Left Arm, Patient Position: Sitting, Cuff Size: Normal)   Pulse 77   Temp 97.9 F (36.6 C) (Oral)   Ht 5\' 11"  (1.803 m)   Wt 229 lb 4 oz (104 kg)   SpO2 99%   BMI 31.97 kg/m  General:  well developed, well nourished, in no apparent distress Heart: RRR, no bruits, no LE edema Lungs: clear to auscultation, no accessory muscle use Psych: well oriented with normal range of affect and appropriate judgment/insight  Essential hypertension  Hyperlipidemia, unspecified hyperlipidemia type - Plan: Lipid panel, Comprehensive metabolic panel  Encounter for hepatitis C screening test for low risk patient - Plan: Hepatitis C antibody  1. OK to stop checking BP at home. Cont Benicar-HCT 40-25 mg/d. Counseled on diet and exercise. 2. Ck labs. Cont Crestor 40 mg/d.  F/u in 6 mo or prn. The  patient voiced understanding and agreement to the plan.  [place], DO 11/16/20  11:53 AM

## 2020-11-21 ENCOUNTER — Other Ambulatory Visit: Payer: Self-pay | Admitting: Family Medicine

## 2020-11-24 MED ORDER — ROSUVASTATIN CALCIUM 40 MG PO TABS: 40.0000 mg | ORAL_TABLET | Freq: Every day | ORAL | 1 refills | Status: DC

## 2020-12-30 ENCOUNTER — Ambulatory Visit: Payer: Medicare Other

## 2021-01-10 MED ORDER — OLMESARTAN MEDOXOMIL-HCTZ 40-25 MG PO TABS: 1.0000 | ORAL_TABLET | Freq: Every evening | ORAL | 1 refills | Status: DC

## 2021-01-12 ENCOUNTER — Encounter: Payer: Self-pay | Admitting: Family Medicine

## 2021-01-12 ENCOUNTER — Ambulatory Visit (INDEPENDENT_AMBULATORY_CARE_PROVIDER_SITE_OTHER): Payer: Medicare Other | Admitting: Family Medicine

## 2021-01-12 ENCOUNTER — Other Ambulatory Visit: Payer: Self-pay

## 2021-01-12 ENCOUNTER — Encounter: Payer: Self-pay | Admitting: Orthopedic Surgery

## 2021-01-12 VITALS — BP 140/80 | HR 65 | Temp 98.3°F | Ht 72.0 in | Wt 225.0 lb

## 2021-01-12 DIAGNOSIS — M545 Low back pain, unspecified: Secondary | ICD-10-CM

## 2021-01-12 MED ORDER — TIZANIDINE HCL 4 MG PO TABS: 4.0000 mg | ORAL_TABLET | Freq: Three times a day (TID) | ORAL | 0 refills | Status: DC | PRN

## 2021-01-12 NOTE — Progress Notes (Signed)
Musculoskeletal Exam  Patient: Bryan Rivers DOB: June 26, 1941  DOS: 01/12/2021  SUBJECTIVE:  Chief Complaint:   Chief Complaint  Patient presents with  . Sciatica    Bryan Rivers is a 80 y.o.  male for evaluation and treatment of back pain.   Onset:  2 days ago.  No inj or change in activity.  Location: lower left Character:  aching, sharp and shooting  Progression of issue:  is unchanged Associated symptoms: Radiates down L thigh/buttock  Denies bowel/bladder incontinence or weakness Treatment: to date has been rest and using a cushion, topical menthol.   Neurovascular symptoms: no  Past Medical History:  Diagnosis Date  . Aneurysm of left popliteal artery ([place]) 01/31/2019   thrombosed left popliteal artery aneurysm  . ED (erectile dysfunction)   . GERD (gastroesophageal reflux disease)   . Hyperlipemia   . Hypertension   . Morbid obesity ([place]) 02/11/2013  . PAD (peripheral artery disease) ([place])   . Prostate cancer ([place])     Objective:  VITAL SIGNS: BP 140/80 (BP Location: Left Arm, Patient Position: Sitting, Cuff Size: Normal)   Pulse 65   Temp 98.3 F (36.8 C) (Oral)   Ht 6' (1.829 m)   Wt 225 lb (102.1 kg)   SpO2 95%   BMI 30.52 kg/m  Constitutional: Well formed, well developed. No acute distress. HENT: Normocephalic, atraumatic.  Thorax & Lungs:  No accessory muscle use Musculoskeletal: low back.   Tenderness to palpation: yes, over lumbar parasp msc on L, ES group on L Deformity: no Ecchymosis: no Straight leg test: negative for Poor hamstring flexibility b/l. Neurologic: Normal sensory function. No focal deficits noted. DTR's equal and symmetric in LE's. No clonus. Psychiatric: Normal mood. Age appropriate judgment and insight. Alert & oriented x 3.    Assessment:  Acute left-sided low back pain without sciatica - Plan: tiZANidine (ZANAFLEX) 4 MG tablet  Plan: Stretches/exercises, heat, ice, Tylenol, muscle relaxer. F/u prn. The patient voiced  understanding and agreement to the plan.   [place], DO 01/12/21  3:57 PM

## 2021-01-12 NOTE — Patient Instructions (Signed)

## 2021-01-26 ENCOUNTER — Encounter: Payer: Self-pay | Admitting: Orthopedic Surgery

## 2021-01-27 ENCOUNTER — Other Ambulatory Visit: Payer: Self-pay

## 2021-01-27 ENCOUNTER — Encounter: Payer: Self-pay | Admitting: Orthopedic Surgery

## 2021-01-27 ENCOUNTER — Ambulatory Visit (INDEPENDENT_AMBULATORY_CARE_PROVIDER_SITE_OTHER): Payer: Medicare Other | Admitting: Orthopedic Surgery

## 2021-01-27 DIAGNOSIS — M19012 Primary osteoarthritis, left shoulder: Secondary | ICD-10-CM | POA: Diagnosis not present

## 2021-01-27 NOTE — Progress Notes (Signed)
Office Visit Note   Patient: Bryan Rivers           Date of Birth: 16-May-1941           MRN: [id number] Visit Date: 01/27/2021 Requested by: Shelda Pal, [address] [place],  [place] [address] PCP: Shelda Pal, DO  Subjective: Chief Complaint  Patient presents with  . Left Shoulder - Pain    HPI: Drayce Tawil is a 80 y.o. male who presents to the office complaining of left shoulder pain.  Patient has a history of left shoulder osteoarthritis of the glenohumeral joint.  He notes that his injection has worn off now.  He is still playing golf 3 times per week.  He also goes to the gym most days and does machine exercises in the gym that consists of legs, rows, floor exercises, push-ups, curls.  Main complaint is pain and decreased range of motion of the left shoulder compared with the right shoulder..                ROS: All systems reviewed are negative as they relate to the chief complaint within the history of present illness.  Patient denies fevers or chills.  Assessment & Plan: Visit Diagnoses:  1. Arthritis of left shoulder region     Plan: Patient is a 80 year old male with history of left shoulder glenohumeral osteoarthritis.  He has fairly good function and pain control given the extensive osteoarthritis.  Last injection has worn off at this point he wants to try another injection.  Left shoulder glenohumeral injection was administered and patient tolerated the procedure well.  Also patient was given shoulder range of motion exercises sheet that he can use to maintain his range of motion as best as possible.  Plan to follow-up as needed with next injection 4 months from now at the earliest.  He does not want to pursue surgery at this time  Follow-Up Instructions: No follow-ups on file.   Orders:  No orders of the defined types were placed in this encounter.  No orders of the defined types were placed in this encounter.      Procedures: Large Joint Inj: L glenohumeral on 01/27/2021 12:30 PM Indications: diagnostic evaluation and pain Details: 18 G 1.5 in needle, posterior approach  Arthrogram: No  Medications: 9 mL bupivacaine 0.5 %; 5 mL lidocaine 1 %; 6 mg betamethasone acetate-betamethasone sodium phosphate 6 (3-3) MG/ML Outcome: tolerated well, no immediate complications Procedure, treatment alternatives, risks and benefits explained, specific risks discussed. Consent was given by the patient. Immediately prior to procedure a time out was called to verify the correct patient, procedure, equipment, support staff and site/side marked as required. Patient was prepped and draped in the usual sterile fashion.       Clinical Data: No additional findings.  Objective: Vital Signs: There were no vitals taken for this visit.  Physical Exam:  Constitutional: Patient appears well-developed HEENT:  Head: Normocephalic Eyes:EOM are normal Neck: Normal range of motion Cardiovascular: Normal rate Pulmonary/chest: Effort normal Neurologic: Patient is alert Skin: Skin is warm Psychiatric: Patient has normal mood and affect  Ortho Exam: Ortho exam demonstrates left shoulder with 20 degrees external rotation, 70 degrees abduction, 100 degrees forward flexion.  Crepitus consistent with osteoarthritis with passive range of motion of the left shoulder.  Excellent subscapularis, supraspinatus, infraspinatus strength.  No pain with cervical spine range of motion.  Specialty Comments:  No specialty comments available.  Imaging: No  results found.   PMFS History: Patient Active Problem List   Diagnosis Date Noted  . S/P left THA, AA 10/02/2019  . Status post total hip replacement, left 10/02/2019  . PAD (peripheral artery disease) ([place]) 02/21/2019  . Arthritis of left hip 01/23/2019  . Chronic left shoulder pain 12/26/2018  . Chronic pain of left knee 12/26/2018  . Lower back injury, initial encounter 01/02/2017   . Left wrist injury 11/09/2015  . Left rib fracture 11/09/2015  . Morbid obesity ([place]) 02/11/2013  . Hyperglycemia 11/04/2012  . Essential hypertension 09/05/2011  . Hyperlipidemia 09/05/2011  . Malignant neoplasm of prostate ([place]) 09/05/2011   Past Medical History:  Diagnosis Date  . Aneurysm of left popliteal artery ([place]) 01/31/2019   thrombosed left popliteal artery aneurysm  . ED (erectile dysfunction)   . GERD (gastroesophageal reflux disease)   . Hyperlipemia   . Hypertension   . Morbid obesity ([place]) 02/11/2013  . PAD (peripheral artery disease) ([place])   . Prostate cancer [facility])     Family History  Problem Relation Age of Onset  . Heart disease Mother   . Diabetes Mother     Past Surgical History:  Procedure Laterality Date  . ABDOMINAL AORTOGRAM W/LOWER EXTREMITY Bilateral 01/31/2019   Procedure: ABDOMINAL AORTOGRAM W/LOWER EXTREMITY;  Surgeon: Angelia Mould, MD;  Location: [place] [facility];  Service: Cardiovascular;  Laterality: Bilateral;  . APPENDECTOMY  1950   age 9  . BYPASS GRAFT FEMORAL-PERONEAL Left 02/21/2019   Procedure: BYPASS GRAFT FEMORAL-PERONEAL WITH VEIN GRAFT;  Surgeon: Angelia Mould, MD;  Location: [place];  Service: Vascular;  Laterality: Left;  . COLONOSCOPY    . TOTAL HIP ARTHROPLASTY Left 10/02/2019   Procedure: TOTAL HIP ARTHROPLASTY ANTERIOR APPROACH;  Surgeon: Paralee Cancel, MD;  [facility];  Service: Orthopedics;  Laterality: Left;  70 mins  . WRIST SURGERY Left 2015   Social History   Occupational History  . Not on file  Tobacco Use  . Smoking status: Never Smoker  . Smokeless tobacco: Never Used  Vaping Use  . Vaping Use: Never used  Substance and Sexual Activity  . Alcohol use: Not Currently    Alcohol/week: 0.0 standard drinks    Comment: occ wine  . Drug use: No  . Sexual activity: Not on file

## 2021-01-29 MED ORDER — LIDOCAINE HCL 1 % IJ SOLN
5.0000 mL | INTRAMUSCULAR | Status: AC | PRN
  Administered 2021-01-27: 5 mL

## 2021-01-29 MED ORDER — BUPIVACAINE HCL 0.5 % IJ SOLN
9.0000 mL | INTRAMUSCULAR | Status: AC | PRN
  Administered 2021-01-27: 9 mL via INTRA_ARTICULAR

## 2021-01-29 MED ORDER — BETAMETHASONE SOD PHOS & ACET 6 (3-3) MG/ML IJ SUSP
6.0000 mg | INTRAMUSCULAR | Status: AC | PRN
  Administered 2021-01-27: 6 mg via INTRA_ARTICULAR

## 2021-02-11 ENCOUNTER — Encounter: Payer: Self-pay | Admitting: Orthopedic Surgery

## 2021-02-11 DIAGNOSIS — M19012 Primary osteoarthritis, left shoulder: Secondary | ICD-10-CM

## 2021-02-11 NOTE — Telephone Encounter (Signed)
Yes  thx

## 2021-02-26 ENCOUNTER — Ambulatory Visit
Admission: RE | Admit: 2021-02-26 | Discharge: 2021-02-26 | Disposition: A | Discharge status: Home / Self Care | Payer: Medicare Other | Source: Ambulatory Visit | Attending: Orthopedic Surgery | Admitting: Orthopedic Surgery

## 2021-02-26 ENCOUNTER — Other Ambulatory Visit: Payer: Self-pay

## 2021-02-26 DIAGNOSIS — M19012 Primary osteoarthritis, left shoulder: Secondary | ICD-10-CM

## 2021-03-03 ENCOUNTER — Other Ambulatory Visit: Payer: Self-pay

## 2021-03-03 ENCOUNTER — Encounter: Payer: Self-pay | Admitting: Orthopedic Surgery

## 2021-03-03 ENCOUNTER — Ambulatory Visit (INDEPENDENT_AMBULATORY_CARE_PROVIDER_SITE_OTHER): Payer: Medicare Other | Admitting: Orthopedic Surgery

## 2021-03-03 DIAGNOSIS — M19012 Primary osteoarthritis, left shoulder: Secondary | ICD-10-CM | POA: Diagnosis not present

## 2021-03-03 NOTE — Progress Notes (Signed)
Office Visit Note   Patient: Bryan Rivers           Date of Birth: [birth year]           MRN: [id number] Visit Date: 03/03/2021 Requested by: Shelda Pal, [address] [place],  [place] [address] PCP: Shelda Pal, DO  Subjective: Chief Complaint  Patient presents with  . Other     Scan review    HPI: Bryan Rivers is a 80 year old patient with left shoulder arthritis.  Here for CT scan review.  In general he is doing pretty well.  Played 18 holes of golf yesterday.  He has been taking CBD Gummies which has been helping his pain.  He actually slept on the left side last night.  CT scan is reviewed and he does have end-stage glenohumeral arthritis but the rotator cuff looks to be intact.              ROS: All systems reviewed are negative as they relate to the chief complaint within the history of present illness.  Patient denies  fevers or chills.   Assessment & Plan: Visit Diagnoses: No diagnosis found.  Plan: Impression is end-stage left shoulder arthritis but very good functional activity which at this time is relatively pain-free.  No indication for shoulder replacement at this time.  As long as he remains functional and is doing well and controls his pain with over-the-counter medication I think we can watch this.  His range of motion slightly restricted but he does exercises and is very conscientious about working out.  Follow-up as needed.  2 to 3 injections in the shoulder joint per year max.  Follow-Up Instructions: Return if symptoms worsen or fail to improve.   Orders:  No orders of the defined types were placed in this encounter.  No orders of the defined types were placed in this encounter.     Procedures: No procedures performed   Clinical Data: No additional findings.  Objective: Vital Signs: There were no vitals taken for this visit.  Physical Exam:   Constitutional: Patient appears well-developed HEENT:  Head:  Normocephalic Eyes:EOM are normal Neck: Normal range of motion Cardiovascular: Normal rate Pulmonary/chest: Effort normal Neurologic: Patient is alert Skin: Skin is warm Psychiatric: Patient has normal mood and affect    Ortho Exam: Ortho exam demonstrates very good grip EPL FPL interosseous wrist flexion extension bicep triceps and deltoid strength.  Range of motion passively on the left-hand side is about 30/70/100.  Rotator cuff strength is good infraspinatus supraspinatus subscap muscle testing.  A lot of coarse grinding with passive range of motion of that left shoulder.  No discrete AC joint tenderness.  Specialty Comments:  No specialty comments available.  Imaging: No results found.   PMFS History: Patient Active Problem List   Diagnosis Date Noted  . S/P left THA, AA 10/02/2019  . Status post total hip replacement, left 10/02/2019  . PAD (peripheral artery disease) ([place]) 02/21/2019  . Arthritis of left hip 01/23/2019  . Chronic left shoulder pain 12/26/2018  . Chronic pain of left knee 12/26/2018  . Lower back injury, initial encounter 01/02/2017  . Left wrist injury 11/09/2015  . Left rib fracture 11/09/2015  . Morbid obesity ([place]) 02/11/2013  . Hyperglycemia 11/04/2012  . Essential hypertension 09/05/2011  . Hyperlipidemia 09/05/2011  . Malignant neoplasm of prostate ([place]) 09/05/2011   Past Medical History:  Diagnosis Date  . Aneurysm of left popliteal artery ([place]) 01/31/2019  thrombosed left popliteal artery aneurysm  . ED (erectile dysfunction)   . GERD (gastroesophageal reflux disease)   . Hyperlipemia   . Hypertension   . Morbid obesity ([place]) 02/11/2013  . PAD (peripheral artery disease) ([place])   . Prostate cancer [facility])     Family History  Problem Relation Age of Onset  . Heart disease Mother   . Diabetes Mother     Past Surgical History:  Procedure Laterality Date  . ABDOMINAL AORTOGRAM W/LOWER EXTREMITY Bilateral 01/31/2019   Procedure:  ABDOMINAL AORTOGRAM W/LOWER EXTREMITY;  Surgeon: Angelia Mould, MD;  Location: [place] [facility];  Service: Cardiovascular;  Laterality: Bilateral;  . APPENDECTOMY  1950   age 51  . BYPASS GRAFT FEMORAL-PERONEAL Left 02/21/2019   Procedure: BYPASS GRAFT FEMORAL-PERONEAL WITH VEIN GRAFT;  Surgeon: Angelia Mould, MD;  Location: [place];  Service: Vascular;  Laterality: Left;  . COLONOSCOPY    . TOTAL HIP ARTHROPLASTY Left 10/02/2019   Procedure: TOTAL HIP ARTHROPLASTY ANTERIOR APPROACH;  Surgeon: Paralee Cancel, MD;  [facility];  Service: Orthopedics;  Laterality: Left;  70 mins  . WRIST SURGERY Left 2015   Social History   Occupational History  . Not on file  Tobacco Use  . Smoking status: Never Smoker  . Smokeless tobacco: Never Used  Vaping Use  . Vaping Use: Never used  Substance and Sexual Activity  . Alcohol use: Not Currently    Alcohol/week: 0.0 standard drinks    Comment: occ wine  . Drug use: No  . Sexual activity: Not on file

## 2021-03-08 ENCOUNTER — Ambulatory Visit (INDEPENDENT_AMBULATORY_CARE_PROVIDER_SITE_OTHER): Payer: Medicare Other | Admitting: Family Medicine

## 2021-03-08 ENCOUNTER — Other Ambulatory Visit: Payer: Self-pay

## 2021-03-08 ENCOUNTER — Encounter: Payer: Self-pay | Admitting: Family Medicine

## 2021-03-08 VITALS — BP 134/86 | HR 75 | Temp 98.5°F | Ht 72.0 in | Wt 226.1 lb

## 2021-03-08 DIAGNOSIS — R9389 Abnormal findings on diagnostic imaging of other specified body structures: Secondary | ICD-10-CM

## 2021-03-08 DIAGNOSIS — I700 Atherosclerosis of aorta: Secondary | ICD-10-CM | POA: Diagnosis not present

## 2021-03-08 NOTE — Progress Notes (Signed)
Chief Complaint  Patient presents with  . Follow-up    Subjective: Patient is a 80 y.o. male here for abnormal CT scan follow-up.  The patient is evaluated by orthopedic surgery for left shoulder pain.  They ordered a CT scan that showed degenerative changes.  It also showed lymphadenopathy in the subpectoral and axillary region on the left.  It also showed aortic atherosclerosis.  The patient denies any cough, night sweats, fevers, unintentional weight loss, or fatigue.  He has a history of hyperlipidemia takes Crestor 40 mg daily.  He reports compliance and no adverse effects.  Diet is fair and he golfs for exercise.  He is also active around his house.  No chest pain or shortness of breath.  He takes aspirin every other day.  Past Medical History:  Diagnosis Date  . Aneurysm of left popliteal artery ([place]) 01/31/2019   thrombosed left popliteal artery aneurysm  . ED (erectile dysfunction)   . GERD (gastroesophageal reflux disease)   . Hyperlipemia   . Hypertension   . Morbid obesity ([place]) 02/11/2013  . PAD (peripheral artery disease) ([place])   . Prostate cancer ([place])     Objective: BP 134/86 (BP Location: Left Arm, Patient Position: Sitting, Cuff Size: Normal)   Pulse 75   Temp 98.5 F (36.9 C) (Oral)   Ht 6' (1.829 m)   Wt 226 lb 2 oz (102.6 kg)   SpO2 95%   BMI 30.67 kg/m  General: Awake, appears stated age Lymph: No palpable lateral pectoral or axillary adenopathy on L Heart: RRR, no bruits Lungs: CTAB, no rales, wheezes or rhonchi. No accessory muscle use Psych: Age appropriate judgment and insight, normal affect and mood  Assessment and Plan: Aortic atherosclerosis (HCC)  Abnormal CT scan  1. Chronic, new; cont Crestor 40 mg/d. Start ASA 81 mg/d. Counseled on diet/exercise. 2. I did not appreciate any palpable adenopathy today.  F/u as originally scheduled.  The patient voiced understanding and agreement to the plan.  [place], DO 03/08/21  3:07  PM

## 2021-03-08 NOTE — Patient Instructions (Signed)
Start taking aspirin daily.   Continue taking the Crestor.  Keep the diet clean and stay active.  I don't feel any abnormal lymph nodes. No need to chase this down at this point.  Let us know if you need anything.

## 2021-03-31 MED ORDER — ROSUVASTATIN CALCIUM 40 MG PO TABS: 40.0000 mg | ORAL_TABLET | Freq: Every day | ORAL | 1 refills | Status: DC

## 2021-05-17 ENCOUNTER — Ambulatory Visit (INDEPENDENT_AMBULATORY_CARE_PROVIDER_SITE_OTHER): Payer: Medicare Other | Admitting: Family Medicine

## 2021-05-17 ENCOUNTER — Encounter: Payer: Self-pay | Admitting: Family Medicine

## 2021-05-17 ENCOUNTER — Other Ambulatory Visit: Payer: Self-pay

## 2021-05-17 VITALS — BP 130/86 | HR 68 | Temp 98.2°F | Ht 72.0 in | Wt 230.2 lb

## 2021-05-17 DIAGNOSIS — I10 Essential (primary) hypertension: Secondary | ICD-10-CM | POA: Diagnosis not present

## 2021-05-17 DIAGNOSIS — I700 Atherosclerosis of aorta: Secondary | ICD-10-CM

## 2021-05-17 LAB — COMPREHENSIVE METABOLIC PANEL
ALT: 21 U/L (ref 0–53)
AST: 17 U/L (ref 0–37)
Albumin: 4.4 g/dL (ref 3.5–5.2)
Alkaline Phosphatase: 59 U/L (ref 39–117)
BUN: 26 mg/dL — ABNORMAL HIGH (ref 6–23)
CO2: 29 mEq/L (ref 19–32)
Calcium: 9.4 mg/dL (ref 8.4–10.5)
Chloride: 102 mEq/L (ref 96–112)
Creatinine, Ser: 1.05 mg/dL (ref 0.40–1.50)
GFR: 68.28 mL/min (ref >60.00)
Glucose, Bld: 102 mg/dL — ABNORMAL HIGH (ref 70–99)
Potassium: 4.6 mEq/L (ref 3.5–5.1)
Sodium: 139 mEq/L (ref 135–145)
Total Bilirubin: 0.6 mg/dL (ref 0.2–1.2)
Total Protein: 6.7 g/dL (ref 6.0–8.3)

## 2021-05-17 LAB — LIPID PANEL
Cholesterol: 141 mg/dL (ref 0–200)
HDL: 52.1 mg/dL (ref >39.00)
LDL Cholesterol: 69 mg/dL (ref 0–99)
NonHDL: 88.56
Total CHOL/HDL Ratio: 3
Triglycerides: 97 mg/dL (ref 0.0–149.0)
VLDL: 19.4 mg/dL (ref 0.0–40.0)

## 2021-05-17 MED ORDER — OLMESARTAN MEDOXOMIL-HCTZ 40-25 MG PO TABS: 1.0000 | ORAL_TABLET | Freq: Every evening | ORAL | 2 refills | Status: DC

## 2021-05-17 MED ORDER — ROSUVASTATIN CALCIUM 40 MG PO TABS: 40.0000 mg | ORAL_TABLET | Freq: Every day | ORAL | 2 refills | Status: DC

## 2021-05-17 NOTE — Patient Instructions (Addendum)
Give [place] 2-3 business days to get the results of your labs back.   Keep the diet clean and stay active.  Consider changing the temperature to 68-72 degrees F.   Let us know if you need anything.

## 2021-05-17 NOTE — Progress Notes (Signed)
Chief Complaint  Patient presents with   Follow-up    Subjective Bryan Rivers is a 79 y.o. male who presents for hypertension follow up. He does monitor home blood pressures. Blood pressures ranging from 130's/80's on average. He is compliant with medications- Benicar HCT 40-25 mg/d. Patient has these side effects of medication: none He is adhering to a healthy diet overall. Current exercise: walking  Hyperlipidemia Patient presents for dyslipidemia follow up. Currently being treated with Crestor 40 mg/d and compliance with treatment thus far has been good. He has known aortic atherosclerosis.  He denies myalgias. The patient is not known to have coexisting coronary artery disease.   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   thrombosed left popliteal artery aneurysm   ED (erectile dysfunction)    GERD (gastroesophageal reflux disease)    Hyperlipemia    Hypertension    Morbid obesity ([place]) 02/11/2013   PAD (peripheral artery disease) (HCC)    Prostate cancer (HCC)     Exam BP 130/86   Pulse 68   Temp 98.2 F (36.8 C) (Oral)   Ht 6' (1.829 m)   Wt 230 lb 4 oz (104.4 kg)   SpO2 96%   BMI 31.23 kg/m  General:  well developed, well nourished, in no apparent distress Heart: RRR, no bruits, no LE edema Lungs: clear to auscultation, no accessory muscle use Psych: well oriented with normal range of affect and appropriate judgment/insight  Essential hypertension  Aortic atherosclerosis (HCC) - Plan: Comprehensive metabolic panel, Lipid panel  Chronic, stable. Cont Benicar HCT 40-25 mg/d. Counseled on diet and exercise. Chronic, stable. Cont Crestor 40 mg/d. He was not fasting.  F/u in 6 mo. The patient voiced understanding and agreement to the plan.  [place], DO 05/17/21  10:46 AM

## 2021-06-08 MED ORDER — OLMESARTAN MEDOXOMIL-HCTZ 40-25 MG PO TABS: 1.0000 | ORAL_TABLET | Freq: Every evening | ORAL | 2 refills | Status: DC

## 2021-07-26 ENCOUNTER — Telehealth: Payer: Self-pay | Admitting: Family Medicine

## 2021-07-26 NOTE — Telephone Encounter (Signed)
LVM for pt to rtn y call to schedule AWV with nha

## 2021-08-01 ENCOUNTER — Other Ambulatory Visit: Payer: Self-pay

## 2021-08-01 ENCOUNTER — Emergency Department (HOSPITAL_BASED_OUTPATIENT_CLINIC_OR_DEPARTMENT_OTHER): Payer: Medicare Other

## 2021-08-01 ENCOUNTER — Emergency Department (HOSPITAL_BASED_OUTPATIENT_CLINIC_OR_DEPARTMENT_OTHER)
Admission: EM | Admit: 2021-08-01 | Discharge: 2021-08-01 | Disposition: A | Discharge status: Home / Self Care | Payer: Medicare Other | Attending: Emergency Medicine | Admitting: Emergency Medicine

## 2021-08-01 ENCOUNTER — Encounter (HOSPITAL_BASED_OUTPATIENT_CLINIC_OR_DEPARTMENT_OTHER): Payer: Self-pay

## 2021-08-01 DIAGNOSIS — I10 Essential (primary) hypertension: Secondary | ICD-10-CM | POA: Diagnosis not present

## 2021-08-01 DIAGNOSIS — U071 COVID-19: Secondary | ICD-10-CM | POA: Diagnosis not present

## 2021-08-01 DIAGNOSIS — Z79899 Other long term (current) drug therapy: Secondary | ICD-10-CM | POA: Insufficient documentation

## 2021-08-01 DIAGNOSIS — Z8546 Personal history of malignant neoplasm of prostate: Secondary | ICD-10-CM | POA: Insufficient documentation

## 2021-08-01 DIAGNOSIS — Z7982 Long term (current) use of aspirin: Secondary | ICD-10-CM | POA: Insufficient documentation

## 2021-08-01 DIAGNOSIS — Z96642 Presence of left artificial hip joint: Secondary | ICD-10-CM | POA: Diagnosis not present

## 2021-08-01 DIAGNOSIS — R059 Cough, unspecified: Secondary | ICD-10-CM | POA: Diagnosis present

## 2021-08-01 MED ORDER — NIRMATRELVIR/RITONAVIR (PAXLOVID)TABLET: 3.0000 | ORAL_TABLET | Freq: Two times a day (BID) | ORAL | 0 refills | Status: AC

## 2021-08-01 NOTE — ED Triage Notes (Signed)
Pt c/o cough x 2 days, home test + for covid

## 2021-08-01 NOTE — ED Provider Notes (Signed)
[facility] EMERGENCY DEPARTMENT Provider Note   CSN: [id number] Arrival date & time: 08/01/21  0907     History Chief Complaint  Patient presents with   Cough    Bryan Rivers is a 80 y.o. male.  The history is provided by the patient.  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   thrombosed left popliteal artery aneurysm   ED (erectile dysfunction)    GERD (gastroesophageal reflux disease)    Hyperlipemia    Hypertension    Morbid obesity ([place]) 02/11/2013   PAD (peripheral artery disease) (HCC)    Prostate cancer [facility])     Patient Active Problem List   Diagnosis Date Noted   Aortic atherosclerosis ([place]) 03/08/2021   S/P left THA, AA 10/02/2019   Status post total hip replacement, left 10/02/2019   PAD (peripheral artery disease) ([place]) 02/21/2019   Arthritis of left hip 01/23/2019   Chronic left shoulder pain 12/26/2018   Chronic pain of left knee 12/26/2018   Lower back injury, initial encounter 01/02/2017   Left wrist injury 11/09/2015   Left rib fracture 11/09/2015   Hyperglycemia 11/04/2012   Essential hypertension 09/05/2011   Hyperlipidemia 09/05/2011    Past Surgical History:  Procedure Laterality Date   ABDOMINAL AORTOGRAM W/LOWER EXTREMITY Bilateral 01/31/2019   Procedure: ABDOMINAL AORTOGRAM W/LOWER EXTREMITY;  Surgeon: Angelia Mould, MD;   Location: [facility];  Service: Cardiovascular;  Laterality: Bilateral;   APPENDECTOMY  1950   age 41   BYPASS GRAFT FEMORAL-PERONEAL Left 02/21/2019   Procedure: BYPASS GRAFT FEMORAL-PERONEAL WITH VEIN GRAFT;  Surgeon: Angelia Mould, MD;  Location: [facility];  Service: Vascular;  Laterality: Left;   COLONOSCOPY     TOTAL HIP ARTHROPLASTY Left 10/02/2019   Procedure: TOTAL HIP ARTHROPLASTY ANTERIOR APPROACH;  Surgeon: Paralee Cancel, MD;  Location: [facility];  Service: Orthopedics;  Laterality: Left;  70 mins   WRIST SURGERY Left 2015       Family History  Problem Relation Age of Onset   Heart disease Mother    Diabetes Mother     Social History   Tobacco Use   Smoking status: Never   Smokeless tobacco: Never  Vaping Use   Vaping Use: Never used  Substance Use Topics   Alcohol use: Not Currently    Alcohol/week: 0.0 standard drinks    Comment: occ wine   Drug use: No    Home Medications Prior to Admission medications   Medication Sig Start Date End Date Taking? Authorizing Provider  aspirin EC 81 MG tablet Take 81 mg by mouth daily. Swallow whole.   Yes [provider]  nirmatrelvir/ritonavir EUA (PAXLOVID) 20 x 150 MG & 10 x '100MG'$  TABS Take 3 tablets by mouth 2 (two) times daily for 5 days. Patient GFR is > 60. Take nirmatrelvir (150 mg) two tablets twice daily for 5 days and ritonavir (100 mg) one  tablet twice daily for 5 days. 08/01/21 08/06/21 Yes Athalee Esterline, DO  olmesartan-hydrochlorothiazide (BENICAR HCT) 40-25 MG tablet Take 1 tablet by mouth every evening. 06/08/21  Yes Shelda Pal, DO  rosuvastatin (CRESTOR) 40 MG tablet Take 1 tablet (40 mg total) by mouth daily. 05/17/21  Yes Shelda Pal, DO    Allergies    Patient has no known allergies.  Review of Systems   Review of Systems  Constitutional:  Negative for chills, diaphoresis, fever and weight loss.  HENT:  Negative for ear pain, rhinorrhea and sore throat.   Eyes:   Negative for pain, discharge and visual disturbance.  Respiratory:  Positive for cough. Negative for shortness of breath and wheezing.   Cardiovascular:  Negative for chest pain and palpitations.  Gastrointestinal:  Negative for abdominal pain and vomiting.  Genitourinary:  Negative for dysuria and hematuria.  Musculoskeletal:  Negative for arthralgias, back pain and myalgias.  Skin:  Negative for color change and rash.  Neurological:  Negative for seizures, syncope and headaches.  All other systems reviewed and are negative.  Physical Exam Updated Vital Signs BP (!) 173/77 (BP Location: Right Arm)   Pulse 80   Temp 98.1 F (36.7 C) (Oral)   Resp 20   Ht 6' (1.829 m)   Wt 99.8 kg   SpO2 100%   BMI 29.84 kg/m   Physical Exam Vitals and nursing note reviewed.  Constitutional:      Appearance: He is well-developed.  HENT:     Head: Normocephalic and atraumatic.     Nose: Nose normal.     Mouth/Throat:     Mouth: Mucous membranes are moist.  Eyes:     Conjunctiva/sclera: Conjunctivae normal.     Pupils: Pupils are equal, round, and reactive to light.  Cardiovascular:     Rate and Rhythm: Normal rate and regular rhythm.     Pulses: Normal pulses.     Heart sounds: No murmur heard. Pulmonary:     Effort: Pulmonary effort is normal. No respiratory distress.     Breath sounds: Normal breath sounds.  Abdominal:     Palpations: Abdomen is soft.     Tenderness: There is no abdominal tenderness.  Musculoskeletal:     Cervical back: Neck supple.  Skin:    General: Skin is warm and dry.     Capillary Refill: Capillary refill takes less than 2 seconds.  Neurological:     General: No focal deficit present.     Mental Status: He is alert.    ED Results / Procedures / Treatments   Labs (all labs ordered are listed, but only abnormal results are displayed) Labs Reviewed - No data to display  EKG None  Radiology DG Chest Portable 1 View  Result Date: 08/01/2021 CLINICAL  DATA:  Cough.  Positive home test for COVID 19. EXAM: PORTABLE CHEST 1 VIEW COMPARISON:  Radiographs 11/08/2015. FINDINGS: 0916 hours. Stable cardiomegaly and mediastinal contours. There is mild aortic atherosclerosis. Interval improved aeration of the lung bases. No evidence of pneumonia, pleural effusion or pneumothorax. The bones appear unchanged, with asymmetric glenohumeral arthropathy on the left. IMPRESSION: Interval improved aeration of the lung bases. No evidence of viral pneumonia. Stable cardiomegaly. Electronically Signed   By: Richardean Sale M.D.   On: 08/01/2021 09:47    Procedures Procedures   Medications Ordered in ED Medications - No data to display  ED Course  I have reviewed the triage vital signs and the nursing notes.  Pertinent labs & imaging results that were available during my care of the patient were reviewed by me and considered in my medical decision making (see chart for details).    MDM Rules/Calculators/A&P                           Bryan Rivers is here for evaluation after testing positive for COVID at home.  Was interested in antiviral.  Overall has minimal symptoms.  Has had a cough but no fever or shortness of breath.  He is fully vaccinated.  Chest x-ray shows no acute findings.  Will prescribe antiviral.  Normal room air oxygenation and no signs of respiratory distress.  Understands return precautions.  Discharged in good condition.  This chart was dictated using voice recognition software.  Despite best efforts to proofread,  errors can occur which can change the documentation meaning.   Final Clinical Impression(s) / ED Diagnoses Final diagnoses:  COVID-19    Rx / DC Orders ED Discharge Orders          Ordered    nirmatrelvir/ritonavir EUA (PAXLOVID) 20 x 150 MG & 10 x '100MG'$  TABS  2 times daily        08/01/21 [place], [place]a Skye, DO 08/01/21 1035

## 2021-10-03 IMAGING — CT CT SHOULDER*L* W/O CM
1 of 2 series · 9 of 14 positions shown, 12 images · non-contrast
Comparison: Radiographs 11/08/2020

CLINICAL DATA: Preoperative planning for total shoulder
arthroplasty.

EXAM:
CT OF THE UPPER LEFT EXTREMITY WITHOUT CONTRAST
TECHNIQUE: Multidetector CT imaging of the upper left extremity was performed
according to the standard protocol.

[Series 5: thin soft · axial · 0.51mm/px · z∈[-238,-75]mm · 9 of 339 slices shown, 12 images]
[im 34/339  soft-tissue]
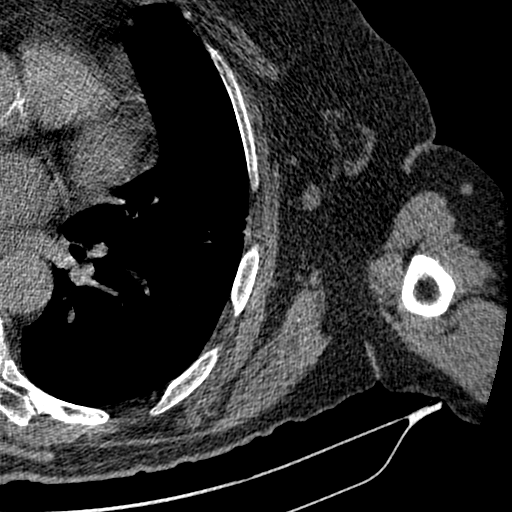
[im 34/339  bone]
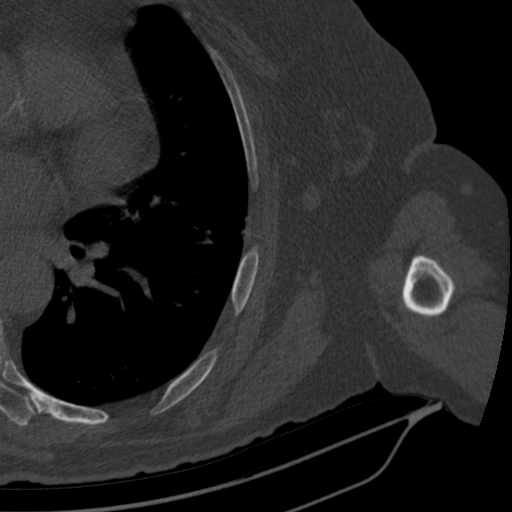
[im 68/339  bone]
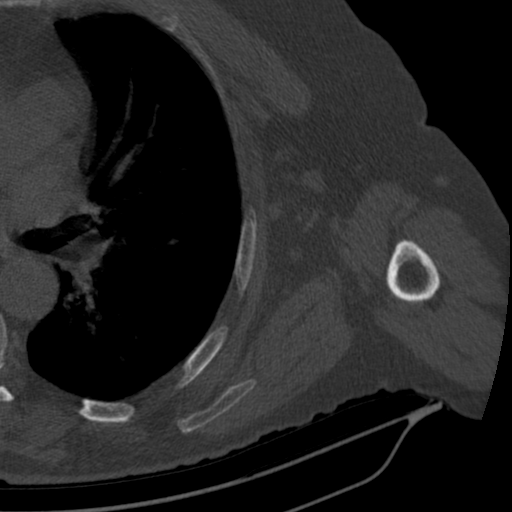
[im 102/339  bone]
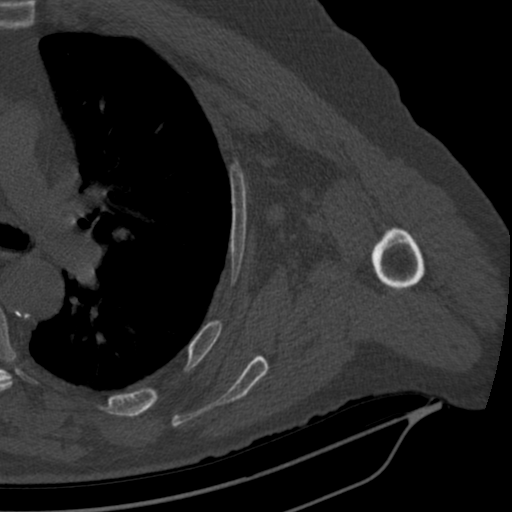
[im 136/339  bone]
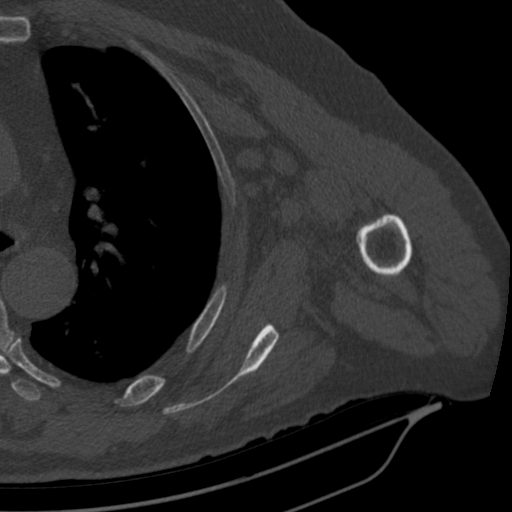
[im 170/339  soft-tissue]
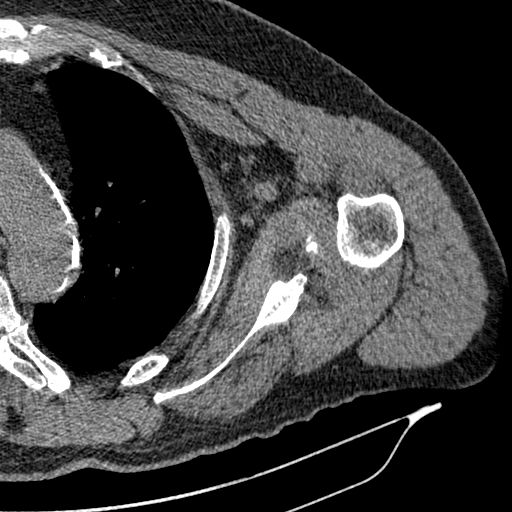
[im 170/339  bone]
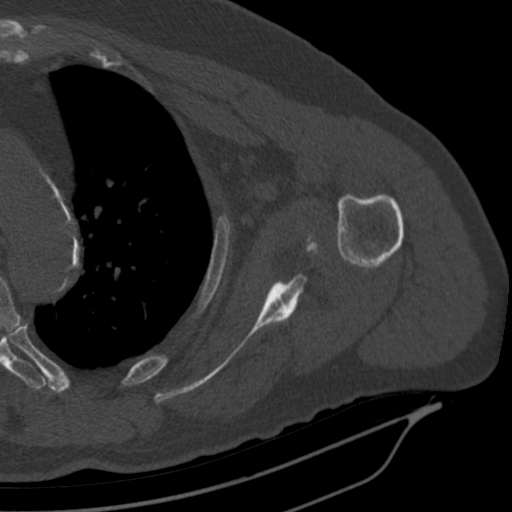
[im 203/339  bone]
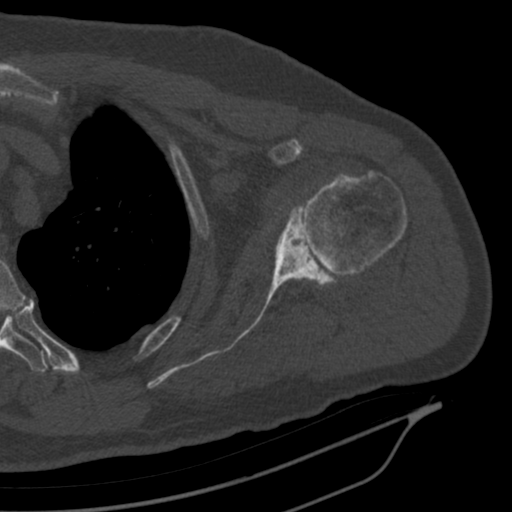
[im 237/339  bone]
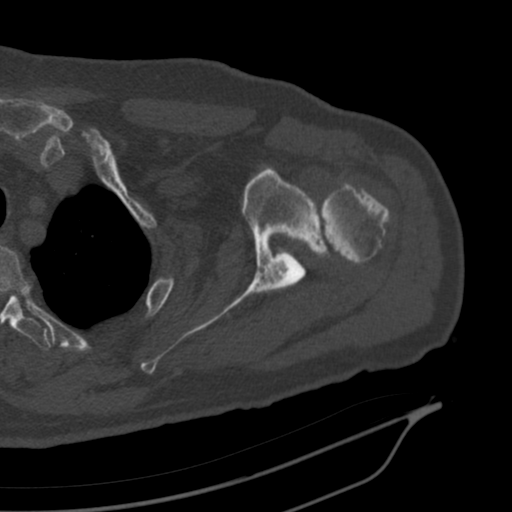
[im 271/339  bone]
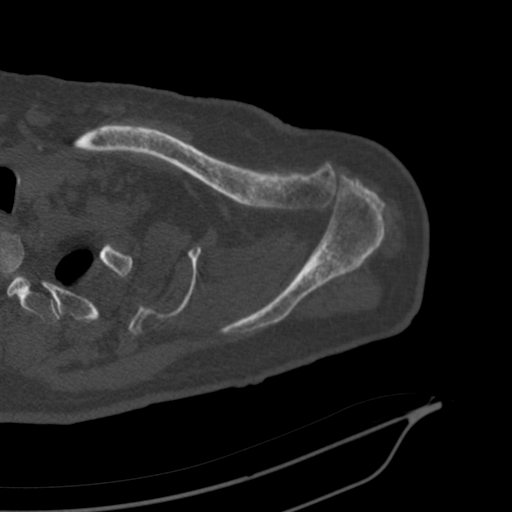
[im 305/339  soft-tissue]
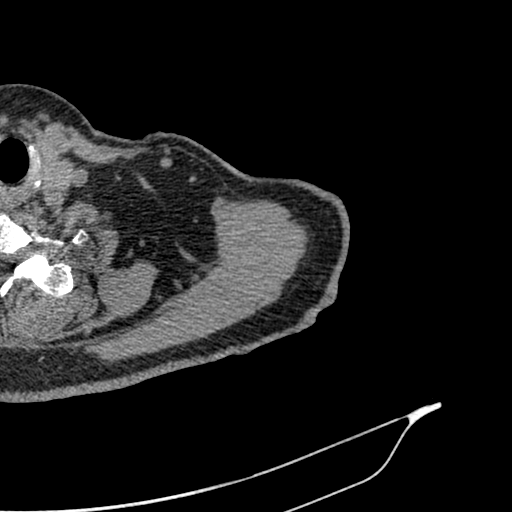
[im 305/339  bone]
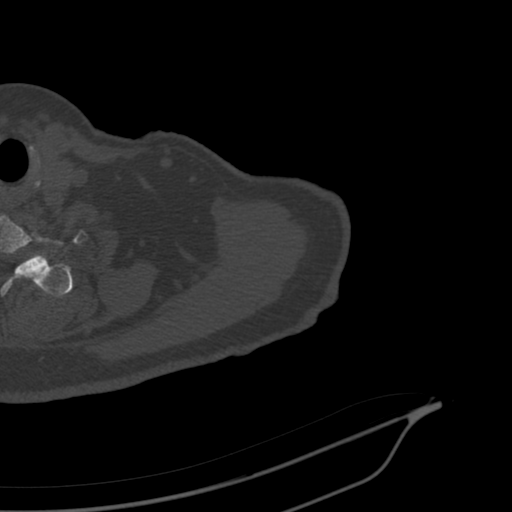

[9 of 14 positions shown; findings below may reference images not displayed]

FINDINGS: Advanced glenohumeral joint degenerative changes with full-thickness
cartilage loss, significant joint space narrowing, bony eburnation,
osteophytic spurring and subchondral cystic change. The glenoid is
mildly widened. No fracture or AVN. Moderate sized glenohumeral
joint effusion and moderate fluid in the subcoracoid bursa.

Mild to moderate AC joint degenerative changes. The acromion is type
2 in shape. Mild subacromial spurring.

The visualized left ribs are intact and the visualized left lung is
unremarkable. Aortic and coronary artery calcifications are noted.
Numerous scattered borderline subpectoral and axillary lymph nodes.
Recommend correlation with any palpable adenopathy.

Grossly by CT the rotator cuff tendons are intact. I do not see an
obvious full-thickness retracted tear.
IMPRESSION: 1. Advanced glenohumeral joint degenerative changes.
2. Moderate sized glenohumeral joint effusion and moderate fluid in
the subcoracoid bursa.
3. Mild to moderate AC joint degenerative changes.
4. Numerous scattered borderline subpectoral and axillary lymph
nodes. Recommend correlation with any palpable adenopathy.
5. Grossly by CT the rotator cuff tendons are intact.
6. Aortic atherosclerosis.

Aortic Atherosclerosis ([id number]).

## 2021-10-06 ENCOUNTER — Ambulatory Visit (INDEPENDENT_AMBULATORY_CARE_PROVIDER_SITE_OTHER): Payer: Medicare Other

## 2021-10-06 VITALS — Ht 72.0 in | Wt 220.0 lb

## 2021-10-06 DIAGNOSIS — Z0000 Encounter for general adult medical examination without abnormal findings: Secondary | ICD-10-CM | POA: Diagnosis not present

## 2021-10-06 NOTE — Patient Instructions (Signed)
Bryan Rivers , Thank you for taking time to complete your Medicare Wellness Visit. I appreciate your ongoing commitment to your health goals. Please review the following plan we discussed and let me know if I can assist you in the future.   Screening recommendations/referrals: Colonoscopy: No longer required Recommended yearly ophthalmology/optometry visit for glaucoma screening and checkup Recommended yearly dental visit for hygiene and checkup  Vaccinations: Influenza vaccine: Declined Pneumococcal vaccine: Up to date Tdap vaccine: Discuss with pharmacy Shingles vaccine: Discuss with pharmacy Covid-19: Booster available at the pharmacy  Advanced directives: Please bring a copy of Living Will and/or Healthcare Power of Attorney for your chart.   Conditions/risks identified: See problem list  Next appointment: Follow up in one year for your annual wellness visit.   Preventive Care 16 Years and Older, Male Preventive care refers to lifestyle choices and visits with your health care provider that can promote health and wellness. What does preventive care include? A yearly physical exam. This is also called an annual well check. Dental exams once or twice a year. Routine eye exams. Ask your health care provider how often you should have your eyes checked. Personal lifestyle choices, including: Daily care of your teeth and gums. Regular physical activity. Eating a healthy diet. Avoiding tobacco and drug use. Limiting alcohol use. Practicing safe sex. Taking low doses of aspirin every day. Taking vitamin and mineral supplements as recommended by your health care provider. What happens during an annual well check? The services and screenings done by your health care provider during your annual well check will depend on your age, overall health, lifestyle risk factors, and family history of disease. Counseling  Your health care provider may ask you questions about your: Alcohol  use. Tobacco use. Drug use. Emotional well-being. Home and relationship well-being. Sexual activity. Eating habits. History of falls. Memory and ability to understand (cognition). Work and work Statistician. Screening  You may have the following tests or measurements: Height, weight, and BMI. Blood pressure. Lipid and cholesterol levels. These may be checked every 5 years, or more frequently if you are over 74 years old. Skin check. Lung cancer screening. You may have this screening every year starting at age 17 if you have a 30-pack-year history of smoking and currently smoke or have quit within the past 15 years. Fecal occult blood test (FOBT) of the stool. You may have this test every year starting at age 43. Flexible sigmoidoscopy or colonoscopy. You may have a sigmoidoscopy every 5 years or a colonoscopy every 10 years starting at age 42. Prostate cancer screening. Recommendations will vary depending on your family history and other risks. Hepatitis C blood test. Hepatitis B blood test. Sexually transmitted disease (STD) testing. Diabetes screening. This is done by checking your blood sugar (glucose) after you have not eaten for a while (fasting). You may have this done every 1-3 years. Abdominal aortic aneurysm (AAA) screening. You may need this if you are a current or former smoker. Osteoporosis. You may be screened starting at age 40 if you are at high risk. Talk with your health care provider about your test results, treatment options, and if necessary, the need for more tests. Vaccines  Your health care provider may recommend certain vaccines, such as: Influenza vaccine. This is recommended every year. Tetanus, diphtheria, and acellular pertussis (Tdap, Td) vaccine. You may need a Td booster every 10 years. Zoster vaccine. You may need this after age 14. Pneumococcal 13-valent conjugate (PCV13) vaccine. One dose is recommended  after age 52. Pneumococcal polysaccharide  (PPSV23) vaccine. One dose is recommended after age 35. Talk to your health care provider about which screenings and vaccines you need and how often you need them. This information is not intended to replace advice given to you by your health care provider. Make sure you discuss any questions you have with your health care provider. Document Released: 12/10/2015 Document Revised: 08/02/2016 Document Reviewed: 09/14/2015 Elsevier Interactive Patient Education  2017 [employer] Prevention in the Home Falls can cause injuries. They can happen to people of all ages. There are many things you can do to make your home safe and to help prevent falls. What can I do on the outside of my home? Regularly fix the edges of walkways and driveways and fix any cracks. Remove anything that might make you trip as you walk through a door, such as a raised step or threshold. Trim any bushes or trees on the path to your home. Use bright outdoor lighting. Clear any walking paths of anything that might make someone trip, such as rocks or tools. Regularly check to see if handrails are loose or broken. Make sure that both sides of any steps have handrails. Any raised decks and porches should have guardrails on the edges. Have any leaves, snow, or ice cleared regularly. Use sand or salt on walking paths during winter. Clean up any spills in your garage right away. This includes oil or grease spills. What can I do in the bathroom? Use night lights. Install grab bars by the toilet and in the tub and shower. Do not use towel bars as grab bars. Use non-skid mats or decals in the tub or shower. If you need to sit down in the shower, use a plastic, non-slip stool. Keep the floor dry. Clean up any water that spills on the floor as soon as it happens. Remove soap buildup in the tub or shower regularly. Attach bath mats securely with double-sided non-slip rug tape. Do not have throw rugs and other things on the  floor that can make you trip. What can I do in the bedroom? Use night lights. Make sure that you have a light by your bed that is easy to reach. Do not use any sheets or blankets that are too big for your bed. They should not hang down onto the floor. Have a firm chair that has side arms. You can use this for support while you get dressed. Do not have throw rugs and other things on the floor that can make you trip. What can I do in the kitchen? Clean up any spills right away. Avoid walking on wet floors. Keep items that you use a lot in easy-to-reach places. If you need to reach something above you, use a strong step stool that has a grab bar. Keep electrical cords out of the way. Do not use floor polish or wax that makes floors slippery. If you must use wax, use non-skid floor wax. Do not have throw rugs and other things on the floor that can make you trip. What can I do with my stairs? Do not leave any items on the stairs. Make sure that there are handrails on both sides of the stairs and use them. Fix handrails that are broken or loose. Make sure that handrails are as long as the stairways. Check any carpeting to make sure that it is firmly attached to the stairs. Fix any carpet that is loose or worn. Avoid having throw  rugs at the top or bottom of the stairs. If you do have throw rugs, attach them to the floor with carpet tape. Make sure that you have a light switch at the top of the stairs and the bottom of the stairs. If you do not have them, ask someone to add them for you. What else can I do to help prevent falls? Wear shoes that: Do not have high heels. Have rubber bottoms. Are comfortable and fit you well. Are closed at the toe. Do not wear sandals. If you use a stepladder: Make sure that it is fully opened. Do not climb a closed stepladder. Make sure that both sides of the stepladder are locked into place. Ask someone to hold it for you, if possible. Clearly mark and make  sure that you can see: Any grab bars or handrails. First and last steps. Where the edge of each step is. Use tools that help you move around (mobility aids) if they are needed. These include: Canes. Walkers. Scooters. Crutches. Turn on the lights when you go into a dark area. Replace any light bulbs as soon as they burn out. Set up your furniture so you have a clear path. Avoid moving your furniture around. If any of your floors are uneven, fix them. If there are any pets around you, be aware of where they are. Review your medicines with your doctor. Some medicines can make you feel dizzy. This can increase your chance of falling. Ask your doctor what other things that you can do to help prevent falls. This information is not intended to replace advice given to you by your health care provider. Make sure you discuss any questions you have with your health care provider. Document Released: 09/09/2009 Document Revised: 04/20/2016 Document Reviewed: 12/18/2014 Elsevier Interactive Patient Education  2017 [employer].

## 2021-10-06 NOTE — Progress Notes (Signed)
Subjective:   Bryan Rivers is a 80 y.o. male who presents for Medicare Annual/Subsequent preventive examination.  I connected with Jabre today by telephone and verified that I am speaking with the correct person using two identifiers. Location patient: home Location provider: work Persons participating in the virtual visit: patient, Marine scientist.    I discussed the limitations, risks, security and privacy concerns of performing an evaluation and management service by telephone and the availability of in person appointments. I also discussed with the patient that there may be a patient responsible charge related to this service. The patient expressed understanding and verbally consented to this telephonic visit.    Interactive audio and video telecommunications were attempted between this provider and patient, however failed, due to patient having technical difficulties OR patient did not have access to video capability.  We continued and completed visit with audio only.  Some vital signs may be absent or patient reported.   Time Spent with patient on telephone encounter: 25 minutes   Review of Systems     Cardiac Risk Factors include: male gender;advanced age (>65men, >62 women);dyslipidemia;hypertension     Objective:    Today's Vitals   10/06/21 1421  Weight: 220 lb (99.8 kg)  Height: 6' (1.829 m)   Body mass index is 29.84 kg/m.  Advanced Directives 10/06/2021 08/01/2021 10/02/2019 09/25/2019 09/17/2019 05/01/2019 03/19/2019  Does Patient Have a Medical Advance Directive? Yes Yes Yes Yes Yes Yes No  Type of Advance Directive Living will Healthcare Power of [place];Living will [place];Living will [place];Living will [place];Living will -  Does patient want to make changes to medical advance directive? - Yes (ED - Information included in AVS) No - Patient declined No - Patient declined No -  Patient declined No - Patient declined -  Copy of [place] in Chart? No - copy requested No - copy requested No - copy requested - - No - copy requested -  Would patient like information on creating a medical advance directive? - - - - - - No - Patient declined    Current Medications (verified) Outpatient Encounter Medications as of 10/06/2021  Medication Sig   aspirin EC 81 MG tablet Take 81 mg by mouth daily. Swallow whole.   olmesartan-hydrochlorothiazide (BENICAR HCT) 40-25 MG tablet Take 1 tablet by mouth every evening.   rosuvastatin (CRESTOR) 40 MG tablet Take 1 tablet (40 mg total) by mouth daily.   No facility-administered encounter medications on file as of 10/06/2021.    Allergies (verified) Patient has no known allergies.   History: Past Medical History:  Diagnosis Date   Aneurysm of left popliteal artery ([place]) 01/31/2019   thrombosed left popliteal artery aneurysm   ED (erectile dysfunction)    GERD (gastroesophageal reflux disease)    Hyperlipemia    Hypertension    Morbid obesity ([place]) 02/11/2013   PAD (peripheral artery disease) ([place])    Prostate cancer [facility])    Past Surgical History:  Procedure Laterality Date   ABDOMINAL AORTOGRAM W/LOWER EXTREMITY Bilateral 01/31/2019   Procedure: ABDOMINAL AORTOGRAM W/LOWER EXTREMITY;  Surgeon: Angelia Mould, MD;  Location: [facility];  Service: Cardiovascular;  Laterality: Bilateral;   APPENDECTOMY  1950   age 71   BYPASS GRAFT FEMORAL-PERONEAL Left 02/21/2019   Procedure: BYPASS GRAFT FEMORAL-PERONEAL WITH VEIN GRAFT;  Surgeon: Angelia Mould, MD;  Location: [facility];  Service: Vascular;  Laterality: Left;  COLONOSCOPY     TOTAL HIP ARTHROPLASTY Left 10/02/2019   Procedure: TOTAL HIP ARTHROPLASTY ANTERIOR APPROACH;  Surgeon: Paralee Cancel, MD;  Location: [facility];  Service: Orthopedics;  Laterality: Left;  70 mins   WRIST SURGERY Left 2015   Family History  Problem Relation Age of  Onset   Heart disease Mother    Diabetes Mother    Social History   Socioeconomic History   Marital status: Married    Spouse name: Not on file   Number of children: Not on file   Years of education: Not on file   Highest education level: Not on file  Occupational History   Not on file  Tobacco Use   Smoking status: Never   Smokeless tobacco: Never  Vaping Use   Vaping Use: Never used  Substance and Sexual Activity   Alcohol use: Not Currently    Alcohol/week: 0.0 standard drinks    Comment: occ wine   Drug use: No   Sexual activity: Not on file  Other Topics Concern   Not on file  Social History Narrative   Married 58 years   1 daughter age 81   Retired   Science writer Determinants of Radio broadcast assistant Strain: Low Risk    Difficulty of Paying Living Expenses: Not hard at all  Food Insecurity: No Food Insecurity   Worried About Charity fundraiser in the Last Year: Never true   Arboriculturist in the Last Year: Never true  Transportation Needs: No Transportation Needs   Lack of Transportation (Medical): No   Lack of Transportation (Non-Medical): No  Physical Activity: Sufficiently Active   Days of Exercise per Week: 3 days   Minutes of Exercise per Session: 90 min  Stress: No Stress Concern Present   Feeling of Stress : Not at all  Social Connections: Moderately Integrated   Frequency of Communication with Friends and Family: More than three times a week   Frequency of Social Gatherings with Friends and Family: Once a week   Attends Religious Services: Never   Marine scientist or Organizations: Yes   Attends Music therapist: More than 4 times per year   Marital Status: Married    Tobacco Counseling Counseling given: Not Answered   Clinical Intake:  Pre-visit preparation completed: Yes  Pain : No/denies pain     BMI - recorded: 29.84 Nutritional Status: BMI 25 -29 Overweight Nutritional Risks: None Diabetes: No  How often  do you need to have someone help you when you read instructions, pamphlets, or other written materials from your doctor or pharmacy?: 1 - Never  Diabetic?No  Interpreter Needed?: No  Information entered by :: Caroleen Hamman LPN   Activities of Daily Living In your present state of health, do you have any difficulty performing the following activities: 10/06/2021 11/16/2020  Hearing? N N  Vision? N N  Difficulty concentrating or making decisions? N N  Walking or climbing stairs? N N  Dressing or bathing? N N  Doing errands, shopping? N N  Preparing Food and eating ? N -  Using the Toilet? N -  In the past six months, have you accidently leaked urine? N -  Do you have problems with loss of bowel control? N -  Managing your Medications? N -  Managing your Finances? N -  Housekeeping or managing your Housekeeping? N -  Some recent data might be hidden    Patient Care Team: Riki Sheer  Eddie Dibbles, DO as PCP - General (Family Medicine)  Indicate any recent Medical Services you may have received from other than [facility] providers in the past year (date may be approximate).     Assessment:   This is a routine wellness examination for [place].  Hearing/Vision screen Hearing Screening - Comments:: No issues Vision Screening - Comments:: Last eye exam-03/2021-Dr. Radene Gunning Wears glasses  Dietary issues and exercise activities discussed: Current Exercise Habits: Home exercise routine, Type of exercise: strength training/weights;Other - see comments (exercise bike), Time (Minutes): 60, Frequency (Times/Week): 3, Weekly Exercise (Minutes/Week): 180, Intensity: Mild, Exercise limited by: None identified   Goals Addressed             This Visit's Progress    Get back to the gym and golf.   On track      Depression Screen PHQ 2/9 Scores 10/06/2021 05/17/2021 11/16/2020 05/01/2019 04/25/2017  PHQ - 2 Score 0 0 0 0 0    Fall Risk Fall Risk  10/06/2021 05/17/2021 05/01/2019 04/25/2017  12/11/2016  Falls in the past year? 0 0 0 No No  Number falls in past yr: 0 0 - - -  Injury with Fall? 0 0 - - -  Risk for fall due to : - No Fall Risks - - -  Follow up Falls prevention discussed Falls evaluation completed - - -    FALL RISK PREVENTION PERTAINING TO THE HOME:  Any stairs in or around the home? No  Home free of loose throw rugs in walkways, pet beds, electrical cords, etc? Yes  Adequate lighting in your home to reduce risk of falls? Yes   ASSISTIVE DEVICES UTILIZED TO PREVENT FALLS:  Life alert? No  Use of a cane, walker or w/c? No  Grab bars in the bathroom? Yes  Shower chair or bench in shower? No  Elevated toilet seat or a handicapped toilet? No   TIMED UP AND GO:  Was the test performed? No . Phone visit   Cognitive Function:Normal cognitive status assessed by this Nurse Health Advisor. No abnormalities found.          Immunizations Immunization History  Administered Date(s) Administered   Influenza-Unspecified 08/30/2017   PFIZER(Purple Top)SARS-COV-2 Vaccination 01/21/2020, 02/18/2020, 11/02/2020   Pneumococcal Conjugate-13 04/05/2017   Pneumococcal Polysaccharide-23 01/13/2016, 12/26/2018    TDAP status: Due, Education has been provided regarding the importance of this vaccine. Advised may receive this vaccine at local pharmacy or Health Dept. Aware to provide a copy of the vaccination record if obtained from local pharmacy or Health Dept. Verbalized acceptance and understanding.  Flu Vaccine status: Declined, Education has been provided regarding the importance of this vaccine but patient still declined. Advised may receive this vaccine at local pharmacy or Health Dept. Aware to provide a copy of the vaccination record if obtained from local pharmacy or Health Dept. Verbalized acceptance and understanding.  Pneumococcal vaccine status: Up to date  Covid-19 vaccine status: Information provided on how to obtain vaccines.   Qualifies for Shingles  Vaccine? Yes   Zostavax completed No   Shingrix Completed?: No.    Education has been provided regarding the importance of this vaccine. Patient has been advised to call insurance company to determine out of pocket expense if they have not yet received this vaccine. Advised may also receive vaccine at local pharmacy or Health Dept. Verbalized acceptance and understanding.  Screening Tests Health Maintenance  Topic Date Due   Zoster Vaccines- Shingrix (1 of 2) Never done  COVID-19 Vaccine (4 - Booster for Pfizer series) 12/28/2020   INFLUENZA VACCINE  06/27/2021   Pneumonia Vaccine 66+ Years old  Completed   Hepatitis C Screening  Completed   HPV VACCINES  Aged Out   TETANUS/TDAP  Discontinued    Health Maintenance  Health Maintenance Due  Topic Date Due   Zoster Vaccines- Shingrix (1 of 2) Never done   COVID-19 Vaccine (4 - Booster for Pfizer series) 12/28/2020   INFLUENZA VACCINE  06/27/2021    Colorectal cancer screening: No longer required.   Lung Cancer Screening: (Low Dose CT Chest recommended if Age 11-80 years, 30 pack-year currently smoking OR have quit w/in 15years.) does not qualify.     Additional Screening:  Hepatitis C Screening: Completed 11/16/2020  Vision Screening: Recommended annual ophthalmology exams for early detection of glaucoma and other disorders of the eye. Is the patient up to date with their annual eye exam?  Yes  Who is the provider or what is the name of the office in which the patient attends annual eye exams? Dr. Philbert Riser   Dental Screening: Recommended annual dental exams for proper oral hygiene  Community Resource Referral / Chronic Care Management: CRR required this visit?  No   CCM required this visit?  No      Plan:     I have personally reviewed and noted the following in the patient's chart:   Medical and social history Use of alcohol, tobacco or illicit drugs  Current medications and supplements including opioid  prescriptions. Patient is not currently taking opioid prescriptions. Functional ability and status Nutritional status Physical activity Advanced directives List of other physicians Hospitalizations, surgeries, and ER visits in previous 12 months Vitals Screenings to include cognitive, depression, and falls Referrals and appointments  In addition, I have reviewed and discussed with patient certain preventive protocols, quality metrics, and best practice recommendations. A written personalized care plan for preventive services as well as general preventive health recommendations were provided to patient.   Due to this being a telephonic visit, the after visit summary with patients personalized plan was offered to patient via mail or my-chart. Patient would like to access on my-chart.   Marta Antu, LPN   [id number]  Nurse Health Advisor  Nurse Notes: None

## 2021-12-28 ENCOUNTER — Encounter: Payer: Self-pay | Admitting: Family Medicine

## 2021-12-28 ENCOUNTER — Telehealth: Payer: Self-pay

## 2021-12-28 MED ORDER — OLMESARTAN MEDOXOMIL-HCTZ 40-25 MG PO TABS: 1.0000 | ORAL_TABLET | Freq: Every evening | ORAL | 2 refills | Status: DC

## 2021-12-28 MED ORDER — ROSUVASTATIN CALCIUM 40 MG PO TABS: 40.0000 mg | ORAL_TABLET | Freq: Every day | ORAL | 2 refills | Status: DC

## 2021-12-28 NOTE — Telephone Encounter (Signed)
Opened to refill medication

## 2022-01-01 ENCOUNTER — Encounter: Payer: Self-pay | Admitting: Family Medicine

## 2022-01-03 ENCOUNTER — Encounter: Payer: Self-pay | Admitting: Family Medicine

## 2022-01-03 ENCOUNTER — Ambulatory Visit (INDEPENDENT_AMBULATORY_CARE_PROVIDER_SITE_OTHER): Payer: Medicare Other | Admitting: Family Medicine

## 2022-01-03 VITALS — BP 120/70 | HR 73 | Temp 98.0°F | Ht 72.0 in | Wt 229.0 lb

## 2022-01-03 DIAGNOSIS — I10 Essential (primary) hypertension: Secondary | ICD-10-CM

## 2022-01-03 DIAGNOSIS — I700 Atherosclerosis of aorta: Secondary | ICD-10-CM

## 2022-01-03 DIAGNOSIS — I739 Peripheral vascular disease, unspecified: Secondary | ICD-10-CM | POA: Diagnosis not present

## 2022-01-03 LAB — LIPID PANEL
Cholesterol: 139 mg/dL (ref 0–200)
HDL: 46.7 mg/dL (ref >39.00)
LDL Cholesterol: 76 mg/dL (ref 0–99)
NonHDL: 91.92
Total CHOL/HDL Ratio: 3
Triglycerides: 79 mg/dL (ref 0.0–149.0)
VLDL: 15.8 mg/dL (ref 0.0–40.0)

## 2022-01-03 LAB — COMPREHENSIVE METABOLIC PANEL
ALT: 23 U/L (ref 0–53)
AST: 16 U/L (ref 0–37)
Albumin: 4.5 g/dL (ref 3.5–5.2)
Alkaline Phosphatase: 63 U/L (ref 39–117)
BUN: 22 mg/dL (ref 6–23)
CO2: 33 mEq/L — ABNORMAL HIGH (ref 19–32)
Calcium: 9.8 mg/dL (ref 8.4–10.5)
Chloride: 100 mEq/L (ref 96–112)
Creatinine, Ser: 1.17 mg/dL (ref 0.40–1.50)
GFR: 59.7 mL/min — ABNORMAL LOW (ref >60.00)
Glucose, Bld: 87 mg/dL (ref 70–99)
Potassium: 4.9 mEq/L (ref 3.5–5.1)
Sodium: 139 mEq/L (ref 135–145)
Total Bilirubin: 0.6 mg/dL (ref 0.2–1.2)
Total Protein: 7 g/dL (ref 6.0–8.3)

## 2022-01-03 NOTE — Patient Instructions (Addendum)
Give us 2-3 business days to get the results of your labs back.   Keep the diet clean and stay active.  Let us know if you need anything. 

## 2022-01-03 NOTE — Progress Notes (Signed)
Chief Complaint  Patient presents with   Follow-up    Subjective Bryan Rivers is a 81 y.o. male who presents for hypertension follow up. He does monitor home blood pressures. Blood pressures ranging from 120's/70's on average. He is compliant with medications- olmesartan HCTZ 40-25 mg/d. Patient has these side effects of medication: none He is adhering to a healthy diet overall. Current exercise: lifting weights, some cardio No Cp or SOB.  Hyperlipidemia Patient presents for dyslipidemia follow up. Currently being treated with Crestor 40 mg/d and compliance with treatment thus far has been good. He denies myalgias. Diet/exercise as above.  The patient is not known to have coexisting coronary artery disease.   Past Medical History:  Diagnosis Date   Aneurysm of left popliteal artery ([place]) 01/31/2019   thrombosed left popliteal artery aneurysm   ED (erectile dysfunction)    GERD (gastroesophageal reflux disease)    Hyperlipemia    Hypertension    Morbid obesity ([place]) 02/11/2013   PAD (peripheral artery disease) ([facility])    Prostate cancer ([facility])     Exam BP 120/70    Pulse 73    Temp 98 F (36.7 C) (Oral)    Ht 6' (1.829 m)    Wt 229 lb (103.9 kg)    SpO2 94%    BMI 31.06 kg/m  General:  well developed, well nourished, in no apparent distress Heart: RRR, no bruits, no LE edema Lungs: clear to auscultation, no accessory muscle use Psych: well oriented with normal range of affect and appropriate judgment/insight  Essential hypertension  Aortic atherosclerosis ([facility]CC) - Plan: Comprehensive metabolic panel, Lipid panel  PAD (peripheral artery disease) ([facility])  Chronic, stable. Cont olmesartan HCTZ 40-25 mg/d. Counseled on diet and exercise. Chronic, stable. Cont Crestor 40 mg/d.  F/u in 6 mo or prn. The patient voiced understanding and agreement to the plan.  [place], DO 01/03/22  1:36 PM

## 2022-03-08 IMAGING — DX DG CHEST 1V PORT
1 series · 1 of 1 positions shown · non-contrast
Comparison: Radiographs 11/08/2015.

CLINICAL DATA: Cough.  Positive home test for COVID 19.

EXAM:
PORTABLE CHEST 1 VIEW

[chest ap]
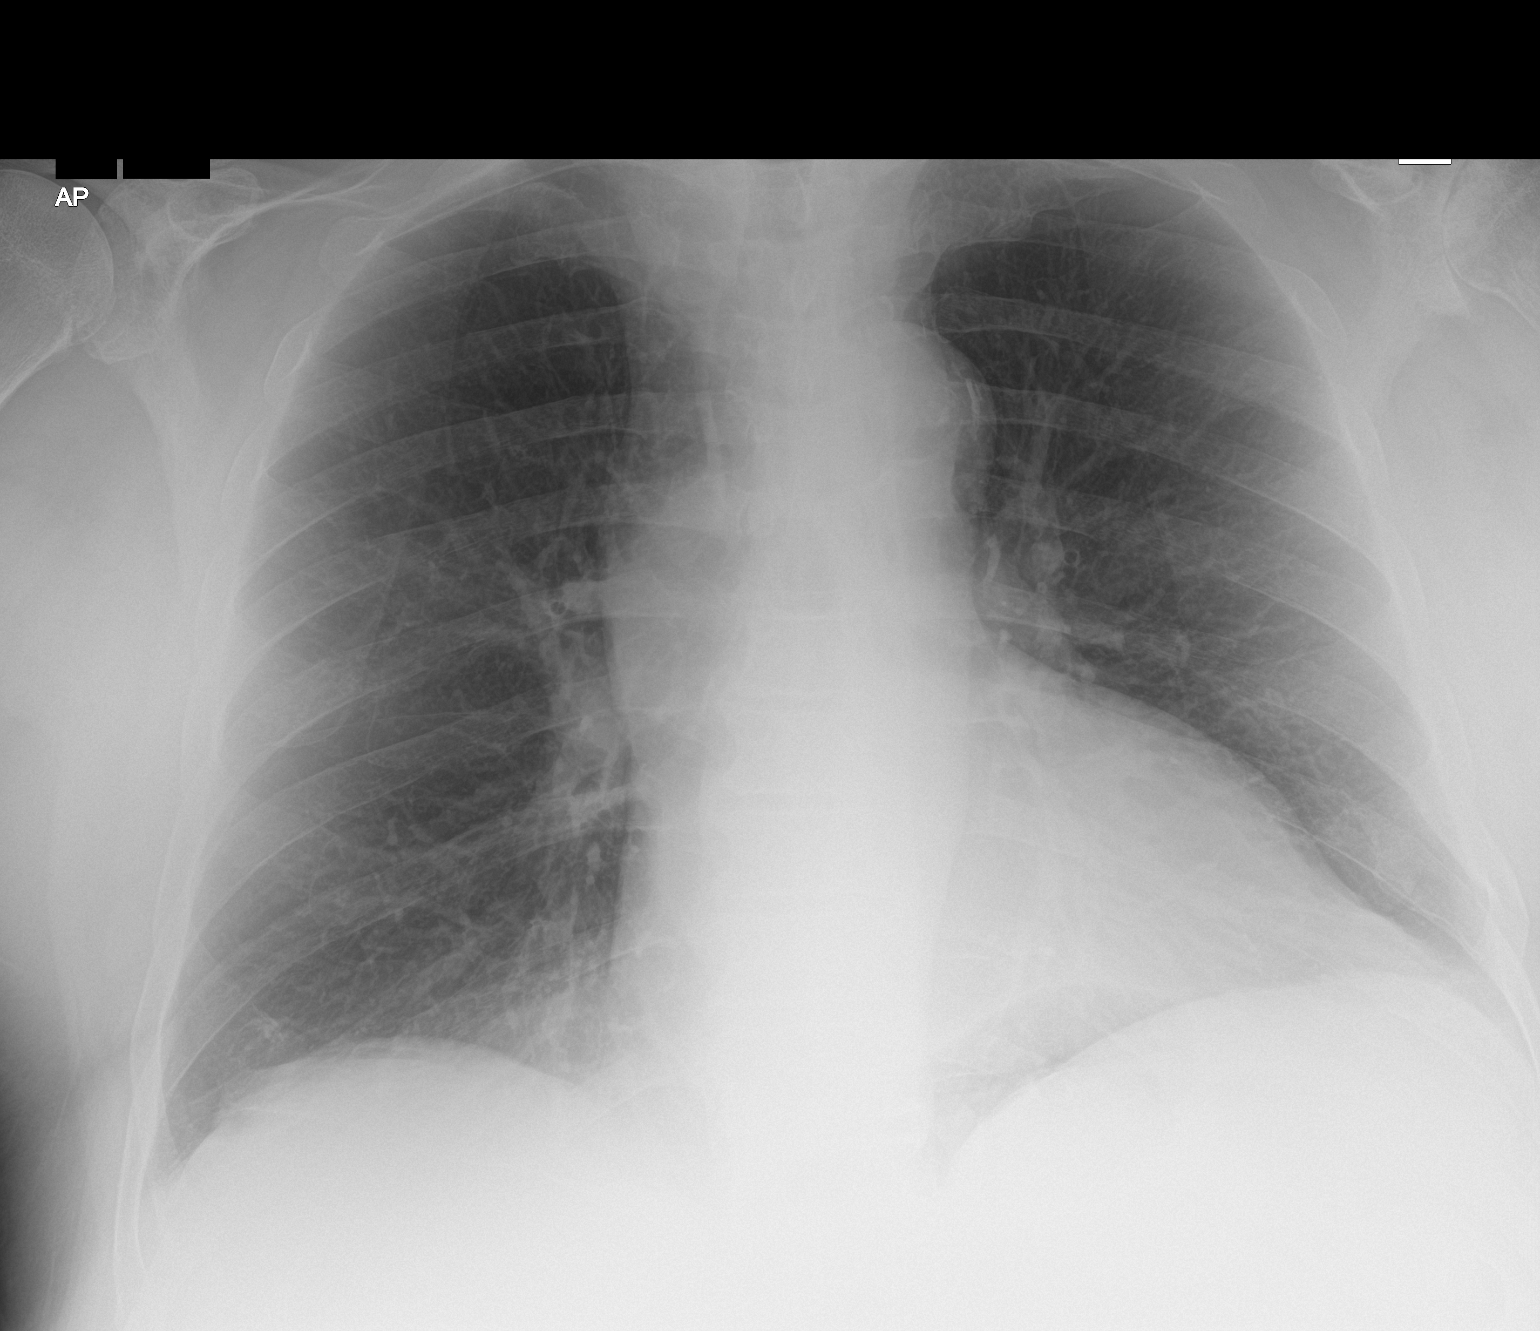

[1 of 1 positions shown; findings below may reference images not displayed]

FINDINGS: 5265 hours. Stable cardiomegaly and mediastinal contours. There is
mild aortic atherosclerosis. Interval improved aeration of the lung
bases. No evidence of pneumonia, pleural effusion or pneumothorax.
The bones appear unchanged, with asymmetric glenohumeral arthropathy
on the left.
IMPRESSION: Interval improved aeration of the lung bases. No evidence of viral
pneumonia. Stable cardiomegaly.

## 2022-03-21 ENCOUNTER — Ambulatory Visit (HOSPITAL_BASED_OUTPATIENT_CLINIC_OR_DEPARTMENT_OTHER)
Admission: RE | Admit: 2022-03-21 | Discharge: 2022-03-21 | Disposition: A | Discharge status: Home / Self Care | Payer: Medicare Other | Source: Ambulatory Visit | Attending: Family Medicine | Admitting: Family Medicine

## 2022-03-21 ENCOUNTER — Ambulatory Visit (INDEPENDENT_AMBULATORY_CARE_PROVIDER_SITE_OTHER): Payer: Medicare Other | Admitting: Family Medicine

## 2022-03-21 ENCOUNTER — Encounter: Payer: Self-pay | Admitting: Family Medicine

## 2022-03-21 VITALS — BP 118/70 | HR 60 | Temp 98.6°F | Ht 72.0 in | Wt 225.5 lb

## 2022-03-21 DIAGNOSIS — M25551 Pain in right hip: Secondary | ICD-10-CM | POA: Diagnosis present

## 2022-03-21 MED ORDER — DICLOFENAC SODIUM 75 MG PO TBEC: 75.0000 mg | DELAYED_RELEASE_TABLET | Freq: Two times a day (BID) | ORAL | 0 refills | Status: DC

## 2022-03-21 NOTE — Progress Notes (Signed)
Musculoskeletal Exam ? ?Patient: Bryan Rivers DOB: Mar 11, 1941 ? ?DOS: 03/21/2022 ? ?SUBJECTIVE: ? ?Chief Complaint:  ? ?Chief Complaint  ?Patient presents with  ? Leg Pain  ?  Right ?  ? Hip Pain  ? ? ?Bryan Rivers is a 81 y.o.  male for evaluation and treatment of R hip pain.  ? ?Onset:  3 months ago. No inj or change in activity.  ?Location: groin pain ?Character:  aching  ?Progression of issue:  has worsened ?Associated symptoms: limping ?No bruising, redness, swelling, catching ?Treatment: to date has been prescription NSAIDS.   ?Had his L hip replaced in 2020.  ?Neurovascular symptoms: no ? ?Past Medical History:  ?Diagnosis Date  ? Aneurysm of left popliteal artery ([place]) 01/31/2019  ? thrombosed left popliteal artery aneurysm  ? ED (erectile dysfunction)   ? GERD (gastroesophageal reflux disease)   ? Hyperlipemia   ? Hypertension   ? Morbid obesity ([place]) 02/11/2013  ? PAD (peripheral artery disease) ([place])   ? Prostate cancer [facility])   ? ? ?Objective: ?VITAL SIGNS: BP 118/70   Pulse 60   Temp 98.6 ?F (37 ?C) (Oral)   Ht 6' (1.829 m)   Wt 225 lb 8 oz (102.3 kg)   SpO2 95%   BMI 30.58 kg/m?  ?Constitutional: Well formed, well developed. No acute distress. ?Thorax & Lungs: No accessory muscle use ?Musculoskeletal: R hip.   ?Normal active range of motion: yes.   ?Normal passive range of motion: yes ?Tenderness to palpation: no ?Deformity: no ?Ecchymosis: no ?Tests positive: [place] ?Tests negative: Log roll, FABER, FADDIR ?Neurologic: Normal sensory function. No focal deficits noted. DTR's equal and symmetric in LE's. No clonus. Antalgic gait.  ?Psychiatric: Normal mood. Age appropriate judgment and insight. Alert & oriented x 3.   ? ?Assessment: ? ?Right hip pain - Plan: DG HIP UNILAT WITH PELVIS 2-3 VIEWS RIGHT ? ?Plan: ?Stretches/exercises, heat, ice, Tylenol. Ck XR of R hip. If OA, will refer to ortho as he had replacement of L hip. If neg, will refer to PT.  ?F/u pending the above. ?The patient voiced  understanding and agreement to the plan. ? ? ?[place], DO ?03/21/22  ?3:52 PM ? ?

## 2022-03-21 NOTE — Patient Instructions (Signed)
We will be in touch regarding your X-ray results.  ? ?Heat (pad or rice pillow in microwave) over affected area, 10-15 minutes twice daily.  ? ?Ice/cold pack over area for 10-15 min twice daily. ? ?OK to take Tylenol 1000 mg (2 extra strength tabs) or 975 mg (3 regular strength tabs) every 6 hours as needed. ? ?Let us know if you need anything. ? ?Hip Exercises ?It is normal to feel mild stretching, pulling, tightness, or discomfort as you do these exercises, but you should stop right away if you feel sudden pain or your pain gets worse.  ? ?STRETCHING AND RANGE OF MOTION EXERCISES ?These exercises warm up your muscles and joints and improve the movement and flexibility of your hip. These exercises also help to relieve pain, numbness, and tingling. ?Exercise A: Hamstrings, Supine ? ?Lie on your back. ?Loop a belt or towel over the ball of your left / right foot. The ball of your foot is on the walking surface, right under your toes. ?Straighten your left / right knee and slowly pull on the belt to raise your leg. ?Do not let your left / right knee bend while you do this. ?Keep your other leg flat on the floor. ?Raise the left / right leg until you feel a gentle stretch behind your left / right knee or thigh. ?Hold this position for 30 seconds. ?Slowly return your leg to the starting position. ?Repeat2 times. Complete this stretch 3 times per week. ?Exercise B: Hip Rotators ? ?Lie on your back on a firm surface. ?Hold your left / right knee with your left / right hand. Hold your ankle with your other hand. ?Gently pull your left / right knee and rotate your lower leg toward your other shoulder. ?Pull until you feel a stretch in your buttocks. ?Keep your hips and shoulders firmly planted while you do this stretch. ?Hold this position for 30 seconds. ?Repeat 2 times. Complete this stretch 3 times per week. ?Exercise C: V-Sit (Hamstrings and Adductors) ? ?Sit on the floor with your legs extended in a large ?V? shape.  Keep your knees straight during this exercise. ?Start with your head and chest upright, then bend at your waist to reach for your left foot (position A). You should feel a stretch in your right inner thigh. ?Hold this position for 30 seconds. Then slowly return to the upright position. ?Bend at your waist to reach forward (position B). You should feel a stretch behind both of your thighs and knees. ?Hold this position for 30 seconds. Then slowly return to the upright position. ?Bend at your waist to reach for your right foot (position C). You should feel a stretch in your left inner thigh. ?Hold this position for 30 seconds. Then slowly return to the upright position. ?Repeat A, B, and C 2 times each. Complete this stretch 3 times per week. ?Exercise D: Lunge (Hip Flexors) ? ?Place your left / right knee on the floor and bend your other knee so that is directly over your ankle. You should be half-kneeling. ?Keep good posture with your head over your shoulders. ?Tighten your buttocks to point your tailbone downward. This helps your back to keep from arching too much. ?You should feel a gentle stretch in the front of your left / right thigh and hip. If you do not feel any resistance, slightly slide your other foot forward and then slowly lunge forward so your knee once again lines up over your ankle. ?Make sure your  tailbone continues to point downward. ?Hold this position for 30 seconds. ?Repeat 2 times. Complete this stretch 3 times per week. ? ?STRENGTHENING EXERCISES ?These exercises build strength and endurance in your hip. Endurance is the ability to use your muscles for a long time, even after they get tired. ?Exercise E: Bridge (Hip Extensors) ? ?Lie on your back on a firm surface with your knees bent and your feet flat on the floor. ?Tighten your buttocks muscles and lift your bottom off the floor until the trunk of your body is level with your thighs. ?Do not arch your back. ?You should feel the muscles  working in your buttocks and the back of your thighs. If you do not feel these muscles, slide your feet 1-2 inches (2.5-5 cm) farther away from your buttocks. ?Hold this position for 3 seconds. ?Slowly lower your hips to the starting position. Repeat for a total of 10 repetitions. ?Let your muscles relax completely between repetitions. ?If this exercise is too easy, try doing it with your arms crossed over your chest. ?Repeat 2 times. Complete this exercise 3 times per week. ?Exercise F: Straight Leg Raises - Hip Abductors ? ?Lie on your side with your left / right leg in the top position. Lie so your head, shoulder, knee, and hip line up with each other. You may bend your bottom knee to help you balance. ?Roll your hips slightly forward, so your hips are stacked directly over each other and your left / right knee is facing forward. ?Leading with your heel, lift your top leg 4-6 inches (10-15 cm). You should feel the muscles in your outer hip lifting. ?Do not let your foot drift forward. ?Do not let your knee roll toward the ceiling. ?Hold this position for 1 second. ?Slowly return to the starting position. ?Let your muscles relax completely between repetitions. Repeat for a total of 10 repetitions.  ?Repeat 2 times. Complete this exercise 3 times per week. ?Exercise G: Straight Leg Raises - Hip Adductors ? ?Lie on your side with your left / right leg in the bottom position. Lie so your head, shoulder, knee, and hip line up. You may place your upper foot in front to help you balance. ?Roll your hips slightly forward, so your hips are stacked directly over each other and your left / right knee is facing forward. ?Tense the muscles in your inner thigh and lift your bottom leg 4-6 inches (10-15 cm). ?Hold this position for 1 second. ?Slowly return to the starting position. ?Let your muscles relax completely between repetitions. Repeat for a total of 10 repetitions. ?Repeat 2 times. Complete this exercise 3 times per  week. ?Exercise H: Straight Leg Raises - Quadriceps ? ?Lie on your back with your left / right leg extended and your other knee bent. ?Tense the muscles in the front of your left / right thigh. When you do this, you should see your kneecap slide up or see increased dimpling just above your knee. ?Tighten these muscles even more and raise your leg 4-6 inches (10-15 cm) off the floor. ?Hold this position for 3 seconds. ?Keep these muscles tense as you lower your leg. ?Relax the muscles slowly and completely between repetitions. Repeat for a total of 10 repetitions. ?Repeat 2 times. Complete this exercise 3 times per week. ?Exercise I: Hip Abductors, Standing ?Tie one end of a rubber exercise band or tubing to a secure surface, such as a table or pole. ?Loop the other end of the band or  tubing around your left / right ankle. ?Keeping your ankle with the band or tubing directly opposite of the secured end, step away until there is tension in the tubing or band. Hold onto a chair as needed for balance. ?Lift your left / right leg out to your side. While you do this: ?Keep your back upright. ?Keep your shoulders over your hips. ?Keep your toes pointing forward. ?Make sure to use your hip muscles to lift your leg. Do not "throw" your leg or tip your body to lift your leg. ?Hold this position for 1 second. ?Slowly return to the starting position. Repeat for a total of 10 repetitions. ?Repeat 2 times. Complete this exercise 3 times per week. ?Exercise J: Squats (Quadriceps) ?Stand in a door frame so your feet and knees are in line with the frame. You may place your hands on the frame for balance. ?Slowly bend your knees and lower your hips like you are going to sit in a chair. ?Keep your lower legs in a straight-up-and-down position. ?Do not let your hips go lower than your knees. ?Do not bend your knees lower than told by your health care provider. ?If your hip pain increases, do not bend as low. ?Hold this position for 1  second. ?Slowly push with your legs to return to standing. Do not use your hands to pull yourself to standing. Repeat for a total of 10 repetitions. ?Repeat 2 times. Complete this exercise 3 times per week. ?M

## 2022-03-22 ENCOUNTER — Encounter: Payer: Self-pay | Admitting: Family Medicine

## 2022-04-05 ENCOUNTER — Telehealth: Payer: Self-pay | Admitting: Family Medicine

## 2022-04-05 NOTE — Telephone Encounter (Signed)
PCP received surgical clearance form from Dr. Alvan Dame. ?Called the patient to schedule a surgical clearance appointment ?Had to leave a message to call back.  ?

## 2022-04-05 NOTE — Telephone Encounter (Signed)
Called and scheduled appt 04/12/22 at 3:30 PM with PCP ?

## 2022-04-06 ENCOUNTER — Encounter: Payer: Self-pay | Admitting: Family Medicine

## 2022-04-11 ENCOUNTER — Ambulatory Visit: Payer: Medicare Other | Admitting: Family Medicine

## 2022-04-12 ENCOUNTER — Other Ambulatory Visit: Payer: Self-pay | Admitting: Family Medicine

## 2022-04-12 ENCOUNTER — Ambulatory Visit: Payer: Medicare Other | Admitting: Family Medicine

## 2022-04-12 ENCOUNTER — Ambulatory Visit (INDEPENDENT_AMBULATORY_CARE_PROVIDER_SITE_OTHER): Payer: Medicare Other | Admitting: Family Medicine

## 2022-04-12 ENCOUNTER — Encounter: Payer: Self-pay | Admitting: Family Medicine

## 2022-04-12 VITALS — BP 130/82 | HR 62 | Temp 97.9°F | Ht 71.0 in | Wt 221.4 lb

## 2022-04-12 DIAGNOSIS — Z01818 Encounter for other preprocedural examination: Secondary | ICD-10-CM | POA: Diagnosis not present

## 2022-04-12 DIAGNOSIS — E875 Hyperkalemia: Secondary | ICD-10-CM

## 2022-04-12 LAB — CBC
HCT: 44.9 % (ref 39.0–52.0)
Hemoglobin: 14.7 g/dL (ref 13.0–17.0)
MCHC: 32.7 g/dL (ref 30.0–36.0)
MCV: 85.7 fl (ref 78.0–100.0)
Platelets: 226 10*3/uL (ref 150.0–400.0)
RBC: 5.24 Mil/uL (ref 4.22–5.81)
RDW: 14 % (ref 11.5–15.5)
WBC: 7.5 10*3/uL (ref 4.0–10.5)

## 2022-04-12 LAB — BASIC METABOLIC PANEL
BUN: 24 mg/dL — ABNORMAL HIGH (ref 6–23)
CO2: 31 mEq/L (ref 19–32)
Calcium: 10.2 mg/dL (ref 8.4–10.5)
Chloride: 99 mEq/L (ref 96–112)
Creatinine, Ser: 1.03 mg/dL (ref 0.40–1.50)
GFR: 69.43 mL/min (ref >60.00)
Glucose, Bld: 98 mg/dL (ref 70–99)
Potassium: 5.2 mEq/L — ABNORMAL HIGH (ref 3.5–5.1)
Sodium: 139 mEq/L (ref 135–145)

## 2022-04-12 NOTE — Progress Notes (Signed)
Subjective:  ? ?Chief Complaint  ?Patient presents with  ? Pre-op Exam  ? ? ?Bryan Rivers  is here for a Pre-operative physical at the request of Dr. Alvan Dame.   ?He  is having R hip replacement surgery on 05/23/22 for R hip OA. ? ?Personal or family hx of adverse outcome to anesthesia? No  ?Chipped, cracked, missing, or loose teeth?  Yes, missing molars.  ?Decreased ROM of neck? No  ?Able to walk up 2 flights of stairs without becoming significantly short of breath or having chest pain? Yes  ? ?Revised Goldman Criteria: ?High Risk Surgery (intraperitoneal, intrathoracic, aortic): No  ?Ischemic heart disease (Prior MI, +excercise stress test, angina, nitrate use, Qwave): No  ?History of heart failure: No  ?History of cerebrovascular disease: No  ?History of diabetes: No  ?Insulin therapy for DM: No  ?Preoperative Cr >2.0: No  ? ?Revised Goldman Criteria - risk for major cardiac death ?No risk factors -- 0.4 percent ?One risk factor -- 1.0 percent  ?Two risk factors -- 2.4 percent  ?Three or more risk factors -- 5.4 percent  ? ?Patient Active Problem List  ? Diagnosis Date Noted  ? Aortic atherosclerosis ([place]) 03/08/2021  ? S/P left THA, AA 10/02/2019  ? Status post total hip replacement, left 10/02/2019  ? PAD (peripheral artery disease) ([place]) 02/21/2019  ? Arthritis of left hip 01/23/2019  ? Chronic left shoulder pain 12/26/2018  ? Chronic pain of left knee 12/26/2018  ? Lower back injury, initial encounter 01/02/2017  ? Left wrist injury 11/09/2015  ? Left rib fracture 11/09/2015  ? Hyperglycemia 11/04/2012  ? Essential hypertension 09/05/2011  ? Hyperlipidemia 09/05/2011  ? ?Past Medical History:  ?Diagnosis Date  ? Aneurysm of left popliteal artery ([place]) 01/31/2019  ? thrombosed left popliteal artery aneurysm  ? ED (erectile dysfunction)   ? GERD (gastroesophageal reflux disease)   ? Hyperlipemia   ? Hypertension   ? Morbid obesity ([place]) 02/11/2013  ? PAD (peripheral artery disease) ([place])   ? Prostate cancer ([place])   ?   ?Past Surgical History:  ?Procedure Laterality Date  ? ABDOMINAL AORTOGRAM W/LOWER EXTREMITY Bilateral 01/31/2019  ? Procedure: ABDOMINAL AORTOGRAM W/LOWER EXTREMITY;  Surgeon: Angelia Mould, MD;  Location: [facility];  Service: Cardiovascular;  Laterality: Bilateral;  ? APPENDECTOMY  1948  ? age 48  ? BYPASS GRAFT FEMORAL-PERONEAL Left 02/21/2019  ? Procedure: BYPASS GRAFT FEMORAL-PERONEAL WITH VEIN GRAFT;  Surgeon: Angelia Mould, MD;  Location: [facility];  Service: Vascular;  Laterality: Left;  ? COLONOSCOPY    ? TOTAL HIP ARTHROPLASTY Left 10/02/2019  ? Procedure: TOTAL HIP ARTHROPLASTY ANTERIOR APPROACH;  Surgeon: Paralee Cancel, MD;  Location: [facility];  Service: Orthopedics;  Laterality: Left;  70 mins  ? WRIST SURGERY Left 2015  ?  ?Current Outpatient Medications  ?Medication Sig Dispense Refill  ? olmesartan-hydrochlorothiazide (BENICAR HCT) 40-25 MG tablet Take 1 tablet by mouth every evening. 90 tablet 2  ? predniSONE (STERAPRED UNI-PAK 48 TAB) 5 MG (48) TBPK tablet Take by mouth. Taper, take as directed on package.    ? rosuvastatin (CRESTOR) 40 MG tablet Take 1 tablet (40 mg total) by mouth daily. 90 tablet 2  ? ?No current facility-administered medications for this visit.  ?  ?No Known Allergies  ?Family History  ?Problem Relation Age of Onset  ? Heart disease Mother   ? Diabetes Mother   ?  ? ?Review of Systems: ? ?Constitutional:  no fevers ?Eye:  no recent significant  change in vision ?Ear:  no hearing loss ?Nose/Mouth/Throat:  No dental complaints ?Neck/Thyroid:  no lumps or masses ?Pulmonary:  No shortness of breath ?Cardiovascular:  no chest pain ?Gastrointestinal:  no abdominal pain ?GU:  negative for dysuria ?Musculoskeletal/Extremities:  no pain ?Skin/Integumentary ROS:  no abnormal skin lesions reported ?Neurologic:  no HA ? ? ?Objective:  ? ?Vitals:  ? 04/12/22 1003  ?BP: 130/82  ?Pulse: 62  ?Temp: 97.9 ?F (36.6 ?C)  ?TempSrc: Oral  ?SpO2: 96%  ?Weight: 221 lb 6 oz (100.4 kg)   ?Height: '5\' 11"'$  (1.803 m)  ? ?Body mass index is 30.88 kg/m?. ? ?General:  well developed, well nourished, in no apparent distress ?Skin:  warm, no pallor or diaphoresis ?Head:  normocephalic, atraumatic ?Eyes:  pupils equal and round, sclera anicteric without injection ?Ears:  canals without lesions, TMs shiny without retraction, no obvious effusion, no erythema ?Throat/Pharynx:  lips and gingiva without lesion; tongue and uvula midline; non-inflamed pharynx; no exudates or postnasal drainage; molars missing b/l on both mandible and maxillary jaw ?Neck: neck supple without adenopathy, thyromegaly, or masses, no bruits, no jugular venous distention ?Lungs:  clear to auscultation, breath sounds equal bilaterally, no respiratory distress ?Cardio:  regular rate and rhythm without murmurs ?Abdomen:  abdomen soft, nontender; bowel sounds normal; no masses, hepatomegaly or splenomegaly ?Musculoskeletal:  symmetrical muscle groups noted without atrophy or deformity ?Extremities:  no clubbing, cyanosis, or edema, no deformities, no skin discoloration ?Neuro:  gait antalgic; deep tendon reflexes normal and symmetric and alert and oriented to person, place, and time ?Psych: Age appropriate judgment and insight; normal mood ? ?Assessment:  ? ?Preop examination - Plan: CBC, Basic metabolic panel  ? ?Plan:  ? ?Orders as above. Not on anti-platelets or anticoag. No med adjustments for surg.  ?The above laboratory work was ordered and will be sent with this physical. ?Pending the above workup, the patient is deemed low cardiac risk for the proposed procedure. ? ?The patient voiced understanding and agreement to the plan. ? ?Shelda Pal, DO ?04/12/22  ?10:24 AM ? ?

## 2022-04-12 NOTE — Patient Instructions (Addendum)
Give [place] 2-3 business days to get the results of your labs back.  ? ?Keep the diet clean and stay active. ? ?There should be no contraindications to the surgery.  ? ?No NSAIDs (anti-inflammatories) until after the surgery.  ? ?Let us know if you need anything. ?

## 2022-04-19 ENCOUNTER — Other Ambulatory Visit (INDEPENDENT_AMBULATORY_CARE_PROVIDER_SITE_OTHER): Payer: Medicare Other

## 2022-04-19 ENCOUNTER — Encounter: Payer: Self-pay | Admitting: Family Medicine

## 2022-04-19 DIAGNOSIS — E875 Hyperkalemia: Secondary | ICD-10-CM | POA: Diagnosis not present

## 2022-04-19 LAB — BASIC METABOLIC PANEL
BUN: 36 mg/dL — ABNORMAL HIGH (ref 6–23)
CO2: 30 mEq/L (ref 19–32)
Calcium: 10 mg/dL (ref 8.4–10.5)
Chloride: 98 mEq/L (ref 96–112)
Creatinine, Ser: 1.34 mg/dL (ref 0.40–1.50)
GFR: 50.62 mL/min — ABNORMAL LOW (ref >60.00)
Glucose, Bld: 88 mg/dL (ref 70–99)
Potassium: 4.8 mEq/L (ref 3.5–5.1)
Sodium: 138 mEq/L (ref 135–145)

## 2022-05-09 NOTE — Progress Notes (Addendum)
Anesthesia Review:  PCP: DR Nani Ravens- clearance 04/12/2022 on chart LOV 04/12/22 in epic  Cardiologist :  Maurice Small Ruoff [place] 04/13/2022  Chest x-ray : 08/01/21-1view  EKG : 05/11/22-  ABI- 04/20/22  Echo : Stress test: Cardiac Cath :  Activity level: can do a flight of stairs without difficulty  Sleep Study/ CPAP : none  Fasting Blood Sugar :      / Checks Blood Sugar -- times a day:   Blood Thinner/ Instructions /Last Dose: ASA / Instructions/ Last Dose :

## 2022-05-10 NOTE — Progress Notes (Signed)
DUE TO COVID-19 ONLY  2  VISITOR IS ALLOWED TO COME WITH YOU AND STAY IN THE WAITING ROOM ONLY DURING PRE OP AND PROCEDURE DAY OF SURGERY.   4 VISITOR  MAY VISIT WITH YOU AFTER SURGERY IN YOUR PRIVATE ROOM DURING VISITING HOURS ONLY! YOU MAY HAVE ONE PERSON SPEND THE NITE WITH YOU IN YOUR ROOM AFTER SURGERY.    TING.    Your procedure is scheduled on:        05/23/22   Report to [facility] Main  Entrance   Report to admitting at      1150am           AM DO NOT BRING INSURANCE CARD, PICTURE ID OR WALLET DAY OF SURGERY.      Call this number if you have problems the morning of surgery [phone]    REMEMBER: NO  SOLID FOODS , CANDY, GUM OR MINTS AFTER [place] .       Mar[facility] Kitchen CLEAR LIQUIDS UNTIL        1120am         DAY OF SURGERY.      PLEASE FINISH ENSURE DRINK PER SURGEON ORDER  WHICH NEEDS TO BE COMPLETED AT    1120am       MORNING OF SURGERY.       CLEAR LIQUID DIET   Foods Allowed      WATER BLACK COFFEE ( SUGAR OK, NO MILK, CREAM OR CREAMER) REGULAR AND DECAF  TEA ( SUGAR OK NO MILK, CREAM, OR CREAMER) REGULAR AND DECAF  PLAIN JELLO ( NO RED)  FRUIT ICES ( NO RED, NO FRUIT PULP)  POPSICLES ( NO RED)  JUICE- APPLE, WHITE GRAPE AND WHITE CRANBERRY  SPORT DRINK LIKE GATORADE ( NO RED)  CLEAR BROTH ( VEGETABLE , CHICKEN OR BEEF)                                                                     BRUSH YOUR TEETH MORNING OF SURGERY AND RINSE YOUR MOUTH OUT, NO CHEWING GUM CANDY OR MINTS.     Take these medicines the morning of surgery with A SIP OF WATER:      DO NOT TAKE ANY DIABETIC MEDICATIONS DAY OF YOUR SURGERY                               You may not have any metal on your body including hair pins and              piercings  Do not wear jewelry, make-up, lotions, powders or perfumes, deodorant             Do not wear nail polish on your fingernails.              IF YOU ARE A MALE AND WANT TO SHAVE UNDER ARMS OR LEGS PRIOR TO  SURGERY YOU MUST DO SO AT LEAST 48 HOURS PRIOR TO SURGERY.              Men may shave face and neck.   Do not bring valuables to the hospital. [facility] IS NOT  RESPONSIBLE   FOR VALUABLES.  Contacts, dentures or bridgework may not be worn into surgery.  Leave suitcase in the car. After surgery it may be brought to your room.     Patients discharged the day of surgery will not be allowed to drive home. IF YOU ARE HAVING SURGERY AND GOING HOME THE SAME DAY, YOU MUST HAVE AN ADULT TO DRIVE YOU HOME AND BE WITH YOU FOR 24 HOURS. YOU MAY GO HOME BY TAXI OR UBER OR ORTHERWISE, BUT AN ADULT MUST ACCOMPANY YOU HOME AND STAY WITH YOU FOR 24 HOURS.                Please read over the following fact sheets you were given: _____________________________________________________________________  [facility] - Preparing for Surgery Before surgery, you can play an important role.  Because skin is not sterile, your skin needs to be as free of germs as possible.  You can reduce the number of germs on your skin by washing with CHG (chlorahexidine gluconate) soap before surgery.  CHG is an antiseptic cleaner which kills germs and bonds with the skin to continue killing germs even after washing. Please DO NOT use if you have an allergy to CHG or antibacterial soaps.  If your skin becomes reddened/irritated stop using the CHG and inform your nurse when you arrive at Short Stay. Do not shave (including legs and underarms) for at least 48 hours prior to the first CHG shower.  You may shave your face/neck. Please follow these instructions carefully:  1.  Shower with CHG Soap the night before surgery and the  morning of Surgery.  2.  If you choose to wash your hair, wash your hair first as usual with your  normal  shampoo.  3.  After you shampoo, rinse your hair and body thoroughly to remove the  shampoo.                           4.  Use CHG as you would any other liquid soap.  You can apply chg directly   to the skin and wash                       Gently with a scrungie or clean washcloth.  5.  Apply the CHG Soap to your body ONLY FROM THE NECK DOWN.   Do not use on face/ open                           Wound or open sores. Avoid contact with eyes, ears mouth and genitals (private parts).                       Wash face,  Genitals (private parts) with your normal soap.             6.  Wash thoroughly, paying special attention to the area where your surgery  will be performed.  7.  Thoroughly rinse your body with warm water from the neck down.  8.  DO NOT shower/wash with your normal soap after using and rinsing off  the CHG Soap.                9.  Pat yourself dry with a clean towel.            10.  Wear clean pajamas.  11.  Place clean sheets on your bed the night of your first shower and do not  sleep with pets. Day of Surgery : Do not apply any lotions/deodorants the morning of surgery.  Please wear clean clothes to the hospital/surgery center.  FAILURE TO FOLLOW THESE INSTRUCTIONS MAY RESULT IN THE CANCELLATION OF YOUR SURGERY PATIENT SIGNATURE_________________________________  NURSE SIGNATURE__________________________________  ________________________________________________________________________

## 2022-05-11 ENCOUNTER — Other Ambulatory Visit: Payer: Self-pay

## 2022-05-11 ENCOUNTER — Encounter (HOSPITAL_COMMUNITY)
Admission: RE | Admit: 2022-05-11 | Discharge: 2022-05-11 | Disposition: A | Discharge status: Home / Self Care | Payer: Medicare Other | Source: Ambulatory Visit | Attending: Orthopedic Surgery | Admitting: Orthopedic Surgery

## 2022-05-11 ENCOUNTER — Encounter (HOSPITAL_COMMUNITY): Payer: Self-pay

## 2022-05-11 DIAGNOSIS — Z01818 Encounter for other preprocedural examination: Secondary | ICD-10-CM

## 2022-05-11 DIAGNOSIS — M1611 Unilateral primary osteoarthritis, right hip: Secondary | ICD-10-CM | POA: Diagnosis not present

## 2022-05-11 HISTORY — DX: Unspecified osteoarthritis, unspecified site: M19.90

## 2022-05-11 LAB — CBC
HCT: 42.8 % (ref 39.0–52.0)
Hemoglobin: 14.1 g/dL (ref 13.0–17.0)
MCH: 28.3 pg (ref 26.0–34.0)
MCHC: 32.9 g/dL (ref 30.0–36.0)
MCV: 85.9 fL (ref 80.0–100.0)
Platelets: 207 10*3/uL (ref 150–400)
RBC: 4.98 MIL/uL (ref 4.22–5.81)
RDW: 13.4 % (ref 11.5–15.5)
WBC: 5.5 10*3/uL (ref 4.0–10.5)
nRBC: 0 % (ref 0.0–0.2)

## 2022-05-11 LAB — SURGICAL PCR SCREEN
MRSA, PCR: NEGATIVE
Staphylococcus aureus: NEGATIVE

## 2022-05-11 LAB — TYPE AND SCREEN
ABO/RH(D): A POS
Antibody Screen: NEGATIVE

## 2022-05-11 LAB — BASIC METABOLIC PANEL
Anion gap: 7 (ref 5–15)
BUN: 26 mg/dL — ABNORMAL HIGH (ref 8–23)
CO2: 26 mmol/L (ref 22–32)
Calcium: 9.6 mg/dL (ref 8.9–10.3)
Chloride: 105 mmol/L (ref 98–111)
Creatinine, Ser: 1.07 mg/dL (ref 0.61–1.24)
GFR, Estimated: >60 mL/min (ref >60)
Glucose, Bld: 97 mg/dL (ref 70–99)
Potassium: 5 mmol/L (ref 3.5–5.1)
Sodium: 138 mmol/L (ref 135–145)

## 2022-05-23 ENCOUNTER — Observation Stay (HOSPITAL_COMMUNITY)
Admission: RE | Admit: 2022-05-23 | Discharge: 2022-05-24 | Disposition: A | Discharge status: Home / Self Care | Payer: Medicare Other | Attending: Orthopedic Surgery | Admitting: Orthopedic Surgery

## 2022-05-23 ENCOUNTER — Encounter (HOSPITAL_COMMUNITY): Payer: Self-pay | Admitting: Orthopedic Surgery

## 2022-05-23 ENCOUNTER — Observation Stay (HOSPITAL_COMMUNITY): Payer: Medicare Other

## 2022-05-23 ENCOUNTER — Other Ambulatory Visit: Payer: Self-pay

## 2022-05-23 ENCOUNTER — Ambulatory Visit (HOSPITAL_BASED_OUTPATIENT_CLINIC_OR_DEPARTMENT_OTHER): Payer: Medicare Other | Admitting: Anesthesiology

## 2022-05-23 ENCOUNTER — Ambulatory Visit (HOSPITAL_COMMUNITY): Payer: Medicare Other | Admitting: Physician Assistant

## 2022-05-23 ENCOUNTER — Encounter (HOSPITAL_COMMUNITY)
Admission: RE | Disposition: A | Discharge status: Home / Self Care | Payer: Self-pay | Source: Home / Self Care | Attending: Orthopedic Surgery

## 2022-05-23 ENCOUNTER — Ambulatory Visit (HOSPITAL_COMMUNITY): Payer: Medicare Other

## 2022-05-23 DIAGNOSIS — Z8546 Personal history of malignant neoplasm of prostate: Secondary | ICD-10-CM | POA: Insufficient documentation

## 2022-05-23 DIAGNOSIS — Z96641 Presence of right artificial hip joint: Secondary | ICD-10-CM

## 2022-05-23 DIAGNOSIS — Z96642 Presence of left artificial hip joint: Secondary | ICD-10-CM | POA: Diagnosis not present

## 2022-05-23 DIAGNOSIS — I10 Essential (primary) hypertension: Secondary | ICD-10-CM | POA: Diagnosis not present

## 2022-05-23 DIAGNOSIS — I739 Peripheral vascular disease, unspecified: Secondary | ICD-10-CM

## 2022-05-23 DIAGNOSIS — Z01818 Encounter for other preprocedural examination: Secondary | ICD-10-CM

## 2022-05-23 DIAGNOSIS — Z79899 Other long term (current) drug therapy: Secondary | ICD-10-CM | POA: Diagnosis not present

## 2022-05-23 DIAGNOSIS — M1611 Unilateral primary osteoarthritis, right hip: Principal | ICD-10-CM | POA: Insufficient documentation

## 2022-05-23 HISTORY — PX: TOTAL HIP ARTHROPLASTY: SHX124

## 2022-05-23 SURGERY — ARTHROPLASTY, HIP, TOTAL, ANTERIOR APPROACH
Anesthesia: Spinal | Site: Hip | Laterality: Right

## 2022-05-23 MED ORDER — PHENYLEPHRINE HCL (PRESSORS) 10 MG/ML IV SOLN: INTRAVENOUS | Status: DC | PRN

## 2022-05-23 MED ORDER — IRBESARTAN 150 MG PO TABS
300.0000 mg | ORAL_TABLET | Freq: Every day | ORAL | Status: DC
  Administered 2022-05-24: 300 mg via ORAL
  Filled 2022-05-23: qty 2

## 2022-05-23 MED ORDER — HYDROCHLOROTHIAZIDE 25 MG PO TABS
25.0000 mg | ORAL_TABLET | Freq: Every day | ORAL | Status: DC
  Administered 2022-05-24: 25 mg via ORAL
  Filled 2022-05-23: qty 1

## 2022-05-23 MED ORDER — METHOCARBAMOL 1000 MG/10ML IJ SOLN: 500.0000 mg | Freq: Four times a day (QID) | INTRAVENOUS | Status: DC | PRN

## 2022-05-23 MED ORDER — SODIUM CHLORIDE 0.9 % IV SOLN: INTRAVENOUS | Status: DC

## 2022-05-23 MED ORDER — MORPHINE SULFATE (PF) 2 MG/ML IV SOLN: 0.5000 mg | INTRAVENOUS | Status: DC | PRN

## 2022-05-23 MED ORDER — MENTHOL 3 MG MT LOZG: 1.0000 | LOZENGE | OROMUCOSAL | Status: DC | PRN

## 2022-05-23 MED ORDER — ONDANSETRON HCL 4 MG/2ML IJ SOLN
4.0000 mg | Freq: Four times a day (QID) | INTRAMUSCULAR | Status: DC | PRN
  Administered 2022-05-24: 4 mg via INTRAVENOUS
  Filled 2022-05-23: qty 2

## 2022-05-23 MED ORDER — FENTANYL CITRATE (PF) 100 MCG/2ML IJ SOLN
INTRAMUSCULAR | Status: AC
  Filled 2022-05-23: qty 2

## 2022-05-23 MED ORDER — TRANEXAMIC ACID-NACL 1000-0.7 MG/100ML-% IV SOLN
1000.0000 mg | INTRAVENOUS | Status: AC
  Administered 2022-05-23: 1000 mg via INTRAVENOUS
  Filled 2022-05-23: qty 100

## 2022-05-23 MED ORDER — PROPOFOL 1000 MG/100ML IV EMUL
INTRAVENOUS | Status: AC
  Filled 2022-05-23: qty 100

## 2022-05-23 MED ORDER — ONDANSETRON HCL 4 MG/2ML IJ SOLN
INTRAMUSCULAR | Status: DC | PRN
  Administered 2022-05-23: 4 mg via INTRAVENOUS

## 2022-05-23 MED ORDER — HYDROMORPHONE HCL 1 MG/ML IJ SOLN: 0.2500 mg | INTRAMUSCULAR | Status: DC | PRN

## 2022-05-23 MED ORDER — POVIDONE-IODINE 10 % EX SWAB
2.0000 | Freq: Once | CUTANEOUS | Status: AC
  Administered 2022-05-23: 2 via TOPICAL

## 2022-05-23 MED ORDER — SODIUM CHLORIDE 0.9 % IR SOLN
Status: DC | PRN
  Administered 2022-05-23: 1000 mL

## 2022-05-23 MED ORDER — OLMESARTAN MEDOXOMIL-HCTZ 40-25 MG PO TABS
1.0000 | ORAL_TABLET | Freq: Every evening | ORAL | Status: DC
Start: 2022-05-24 — End: 2022-05-23

## 2022-05-23 MED ORDER — BISACODYL 10 MG RE SUPP: 10.0000 mg | Freq: Every day | RECTAL | Status: DC | PRN

## 2022-05-23 MED ORDER — OXYCODONE HCL 5 MG/5ML PO SOLN: 5.0000 mg | Freq: Once | ORAL | Status: DC | PRN

## 2022-05-23 MED ORDER — PROPOFOL 500 MG/50ML IV EMUL
INTRAVENOUS | Status: DC | PRN
  Administered 2022-05-23: 75 ug/kg/min via INTRAVENOUS

## 2022-05-23 MED ORDER — HYDROCODONE-ACETAMINOPHEN 5-325 MG PO TABS
1.0000 | ORAL_TABLET | ORAL | Status: DC | PRN
  Administered 2022-05-23 – 2022-05-24 (×2): 2 via ORAL
  Filled 2022-05-23 (×2): qty 2

## 2022-05-23 MED ORDER — ACETAMINOPHEN 325 MG PO TABS: 325.0000 mg | ORAL_TABLET | Freq: Four times a day (QID) | ORAL | Status: DC | PRN

## 2022-05-23 MED ORDER — FERROUS SULFATE 325 (65 FE) MG PO TABS
325.0000 mg | ORAL_TABLET | Freq: Three times a day (TID) | ORAL | Status: DC
  Administered 2022-05-23 – 2022-05-24 (×2): 325 mg via ORAL
  Filled 2022-05-23 (×2): qty 1

## 2022-05-23 MED ORDER — EPHEDRINE SULFATE (PRESSORS) 50 MG/ML IJ SOLN
INTRAMUSCULAR | Status: DC | PRN
  Administered 2022-05-23: 10 mg via INTRAVENOUS
  Administered 2022-05-23: 5 mg via INTRAVENOUS
  Administered 2022-05-23: 10 mg via INTRAVENOUS

## 2022-05-23 MED ORDER — POLYETHYLENE GLYCOL 3350 17 G PO PACK: 17.0000 g | PACK | Freq: Every day | ORAL | Status: DC | PRN

## 2022-05-23 MED ORDER — ASPIRIN 81 MG PO CHEW
81.0000 mg | CHEWABLE_TABLET | Freq: Two times a day (BID) | ORAL | Status: DC
  Administered 2022-05-23 – 2022-05-24 (×2): 81 mg via ORAL
  Filled 2022-05-23 (×2): qty 1

## 2022-05-23 MED ORDER — ONDANSETRON HCL 4 MG/2ML IJ SOLN: 4.0000 mg | Freq: Once | INTRAMUSCULAR | Status: DC | PRN

## 2022-05-23 MED ORDER — METOCLOPRAMIDE HCL 5 MG PO TABS: 5.0000 mg | ORAL_TABLET | Freq: Three times a day (TID) | ORAL | Status: DC | PRN

## 2022-05-23 MED ORDER — FENTANYL CITRATE (PF) 100 MCG/2ML IJ SOLN
INTRAMUSCULAR | Status: DC | PRN
  Administered 2022-05-23 (×2): 50 ug via INTRAVENOUS

## 2022-05-23 MED ORDER — ACETAMINOPHEN 10 MG/ML IV SOLN: 1000.0000 mg | Freq: Once | INTRAVENOUS | Status: DC | PRN

## 2022-05-23 MED ORDER — CHLORHEXIDINE GLUCONATE 0.12 % MT SOLN
15.0000 mL | Freq: Once | OROMUCOSAL | Status: AC
  Administered 2022-05-23: 15 mL via OROMUCOSAL

## 2022-05-23 MED ORDER — METHOCARBAMOL 500 MG PO TABS
500.0000 mg | ORAL_TABLET | Freq: Four times a day (QID) | ORAL | Status: DC | PRN
  Administered 2022-05-23: 500 mg via ORAL
  Filled 2022-05-23: qty 1

## 2022-05-23 MED ORDER — MIDAZOLAM HCL 5 MG/5ML IJ SOLN
INTRAMUSCULAR | Status: DC | PRN
  Administered 2022-05-23 (×2): 1 mg via INTRAVENOUS

## 2022-05-23 MED ORDER — ORAL CARE MOUTH RINSE: 15.0000 mL | Freq: Once | OROMUCOSAL | Status: AC

## 2022-05-23 MED ORDER — DEXAMETHASONE SODIUM PHOSPHATE 10 MG/ML IJ SOLN
10.0000 mg | Freq: Once | INTRAMUSCULAR | Status: AC
  Administered 2022-05-24: 10 mg via INTRAVENOUS
  Filled 2022-05-23: qty 1

## 2022-05-23 MED ORDER — CEFAZOLIN SODIUM-DEXTROSE 2-4 GM/100ML-% IV SOLN
2.0000 g | Freq: Four times a day (QID) | INTRAVENOUS | Status: AC
  Administered 2022-05-23 – 2022-05-24 (×2): 2 g via INTRAVENOUS
  Filled 2022-05-23 (×2): qty 100

## 2022-05-23 MED ORDER — PHENYLEPHRINE HCL-NACL 20-0.9 MG/250ML-% IV SOLN
INTRAVENOUS | Status: DC | PRN
  Administered 2022-05-23: 50 ug/min via INTRAVENOUS

## 2022-05-23 MED ORDER — PHENOL 1.4 % MT LIQD: 1.0000 | OROMUCOSAL | Status: DC | PRN

## 2022-05-23 MED ORDER — LACTATED RINGERS IV SOLN: INTRAVENOUS | Status: DC

## 2022-05-23 MED ORDER — BUPIVACAINE IN DEXTROSE 0.75-8.25 % IT SOLN
INTRATHECAL | Status: DC | PRN
  Administered 2022-05-23: 1.8 mL via INTRATHECAL

## 2022-05-23 MED ORDER — METOCLOPRAMIDE HCL 5 MG/ML IJ SOLN: 5.0000 mg | Freq: Three times a day (TID) | INTRAMUSCULAR | Status: DC | PRN

## 2022-05-23 MED ORDER — DEXAMETHASONE SODIUM PHOSPHATE 10 MG/ML IJ SOLN
8.0000 mg | Freq: Once | INTRAMUSCULAR | Status: AC
  Administered 2022-05-23: 8 mg via INTRAVENOUS

## 2022-05-23 MED ORDER — MIDAZOLAM HCL 2 MG/2ML IJ SOLN
INTRAMUSCULAR | Status: AC
  Filled 2022-05-23: qty 2

## 2022-05-23 MED ORDER — TRANEXAMIC ACID-NACL 1000-0.7 MG/100ML-% IV SOLN
1000.0000 mg | Freq: Once | INTRAVENOUS | Status: AC
  Administered 2022-05-23: 1000 mg via INTRAVENOUS
  Filled 2022-05-23: qty 100

## 2022-05-23 MED ORDER — ONDANSETRON HCL 4 MG PO TABS: 4.0000 mg | ORAL_TABLET | Freq: Four times a day (QID) | ORAL | Status: DC | PRN

## 2022-05-23 MED ORDER — DOCUSATE SODIUM 100 MG PO CAPS
100.0000 mg | ORAL_CAPSULE | Freq: Two times a day (BID) | ORAL | Status: DC
  Administered 2022-05-23 – 2022-05-24 (×2): 100 mg via ORAL
  Filled 2022-05-23 (×2): qty 1

## 2022-05-23 MED ORDER — HYDROCODONE-ACETAMINOPHEN 7.5-325 MG PO TABS
1.0000 | ORAL_TABLET | ORAL | Status: DC | PRN
  Administered 2022-05-23: 1 via ORAL
  Filled 2022-05-23: qty 1

## 2022-05-23 MED ORDER — DIPHENHYDRAMINE HCL 12.5 MG/5ML PO ELIX: 12.5000 mg | ORAL_SOLUTION | ORAL | Status: DC | PRN

## 2022-05-23 MED ORDER — OXYCODONE HCL 5 MG PO TABS: 5.0000 mg | ORAL_TABLET | Freq: Once | ORAL | Status: DC | PRN

## 2022-05-23 MED ORDER — CEFAZOLIN SODIUM-DEXTROSE 2-4 GM/100ML-% IV SOLN
2.0000 g | INTRAVENOUS | Status: AC
  Administered 2022-05-23: 2 g via INTRAVENOUS
  Filled 2022-05-23: qty 100

## 2022-05-23 MED ORDER — ROSUVASTATIN CALCIUM 20 MG PO TABS
40.0000 mg | ORAL_TABLET | Freq: Every day | ORAL | Status: DC
  Administered 2022-05-23: 40 mg via ORAL
  Filled 2022-05-23: qty 2

## 2022-05-23 SURGICAL SUPPLY — 44 items
ARTICULEZE HEAD (Hips) ×2 IMPLANT
BAG COUNTER SPONGE SURGICOUNT (BAG) IMPLANT
BAG DECANTER FOR FLEXI CONT (MISCELLANEOUS) IMPLANT
BAG ZIPLOCK 12X15 (MISCELLANEOUS) IMPLANT
BLADE SAG 18X100X1.27 (BLADE) ×2 IMPLANT
COVER PERINEAL POST (MISCELLANEOUS) ×2 IMPLANT
COVER SURGICAL LIGHT HANDLE (MISCELLANEOUS) ×2 IMPLANT
CUP ACET PINNACLE SECTR 56MM (Hips) IMPLANT
DERMABOND ADVANCED (GAUZE/BANDAGES/DRESSINGS) ×1
DERMABOND ADVANCED .7 DNX12 (GAUZE/BANDAGES/DRESSINGS) ×1 IMPLANT
DRAPE FOOT SWITCH (DRAPES) ×2 IMPLANT
DRAPE STERI IOBAN 125X83 (DRAPES) ×2 IMPLANT
DRAPE U-SHAPE 47X51 STRL (DRAPES) ×4 IMPLANT
DRESSING AQUACEL AG SP 3.5X10 (GAUZE/BANDAGES/DRESSINGS) ×1 IMPLANT
DRSG AQUACEL AG ADV 3.5X10 (GAUZE/BANDAGES/DRESSINGS) ×1 IMPLANT
DRSG AQUACEL AG SP 3.5X10 (GAUZE/BANDAGES/DRESSINGS) ×2
DURAPREP 26ML APPLICATOR (WOUND CARE) ×2 IMPLANT
ELECT REM PT RETURN 15FT ADLT (MISCELLANEOUS) ×2 IMPLANT
ELIMINATOR HOLE APEX DEPUY (Hips) ×1 IMPLANT
GLOVE BIO SURGEON STRL SZ 6 (GLOVE) ×2 IMPLANT
GLOVE BIOGEL PI IND STRL 6.5 (GLOVE) ×1 IMPLANT
GLOVE BIOGEL PI IND STRL 7.5 (GLOVE) ×1 IMPLANT
GLOVE BIOGEL PI INDICATOR 6.5 (GLOVE) ×1
GLOVE BIOGEL PI INDICATOR 7.5 (GLOVE) ×1
GLOVE ORTHO TXT STRL SZ7.5 (GLOVE) ×4 IMPLANT
GOWN STRL REUS W/ TWL LRG LVL3 (GOWN DISPOSABLE) ×3 IMPLANT
GOWN STRL REUS W/TWL LRG LVL3 (GOWN DISPOSABLE) ×3
HEAD ARTICULEZE (Hips) IMPLANT
HOLDER FOLEY CATH W/STRAP (MISCELLANEOUS) ×2 IMPLANT
KIT TURNOVER KIT A (KITS) IMPLANT
PACK ANTERIOR HIP CUSTOM (KITS) ×2 IMPLANT
PINNACLE ALTRX PLUS 4 N 36X56 (Hips) ×1 IMPLANT
PINNACLE SECTOR CUP 56MM (Hips) ×2 IMPLANT
SCREW 6.5MMX30MM (Screw) ×1 IMPLANT
STEM FEMORAL SZ6 HIGH ACTIS (Stem) ×1 IMPLANT
SUT MNCRL AB 4-0 PS2 18 (SUTURE) ×2 IMPLANT
SUT STRATAFIX 0 PDS 27 VIOLET (SUTURE) ×2
SUT VIC AB 1 CT1 36 (SUTURE) ×6 IMPLANT
SUT VIC AB 2-0 CT1 27 (SUTURE) ×2
SUT VIC AB 2-0 CT1 TAPERPNT 27 (SUTURE) ×2 IMPLANT
SUTURE STRATFX 0 PDS 27 VIOLET (SUTURE) ×1 IMPLANT
TRAY FOLEY MTR SLVR 16FR STAT (SET/KITS/TRAYS/PACK) IMPLANT
TUBE SUCTION HIGH CAP CLEAR NV (SUCTIONS) ×2 IMPLANT
WATER STERILE IRR 1000ML POUR (IV SOLUTION) ×2 IMPLANT

## 2022-05-23 NOTE — Op Note (Signed)
NAME:  Bryan Rivers NO.: [id number]      MEDICAL RECORD NO.: [id number]      FACILITY:  [facility]      PHYSICIAN:  Shelda Pal  DATE OF BIRTH:  Dec 21, 1940     DATE OF PROCEDURE:  05/23/2022                                 OPERATIVE REPORT         PREOPERATIVE DIAGNOSIS: Right  hip osteoarthritis.      POSTOPERATIVE DIAGNOSIS:  Right hip osteoarthritis.      PROCEDURE:  Right total hip replacement through an anterior approach   utilizing DePuy THR system, component size 56 mm pinnacle cup, a size 36+4 neutral Altrex liner, a size 6 Hi Actis stem with a 36+5 Articuleze metal head ball.      SURGEON:  Madlyn Frankel. Charlann Boxer, M.D.      ASSISTANT:  Rosalene Billings, PA-C     ANESTHESIA:  Spinal.      SPECIMENS:  None.      COMPLICATIONS:  None.      BLOOD LOSS:  650 cc     DRAINS:  None.      INDICATION OF THE PROCEDURE:  Bryan Rivers is a 81 y.o. male who had   presented to office for evaluation of right hip pain.  Radiographs revealed   progressive degenerative changes with bone-on-bone   articulation of the  hip joint, including subchondral cystic changes and osteophytes.  The patient had painful limited range of   motion significantly affecting their overall quality of life and function.  The patient was failing to    respond to conservative measures including medications and/or injections and activity modification and at this point was ready   to proceed with more definitive measures.  Consent was obtained for   benefit of pain relief.  Specific risks of infection, DVT, component   failure, dislocation, neurovascular injury, and need for revision surgery were reviewed in the office.     PROCEDURE IN DETAIL:  The patient was brought to operative theater.   Once adequate anesthesia, preoperative antibiotics, 2 gm of Ancef, 1 gm of Tranexamic Acid, and 10 mg of Decadron were administered, the patient was positioned supine on the [employer]  table.  Once the patient was safely positioned with adequate padding of boney prominences we predraped out the hip, and used fluoroscopy to confirm orientation of the pelvis.      The right hip was then prepped and draped from proximal iliac crest to   mid thigh with a shower curtain technique.      Time-out was performed identifying the patient, planned procedure, and the appropriate extremity.     An incision was then made 2 cm lateral to the   anterior superior iliac spine extending over the orientation of the   tensor fascia lata muscle and sharp dissection was carried down to the   fascia of the muscle.      The fascia was then incised.  The muscle belly was identified and swept   laterally and retractor placed along the superior neck.  Following   cauterization of the circumflex vessels and removing some pericapsular   fat, a second cobra retractor was placed on the inferior neck.  A T-capsulotomy was  made along the line of the   superior neck to the trochanteric fossa, then extended proximally and   distally.  Tag sutures were placed and the retractors were then placed   intracapsular.  We then identified the trochanteric fossa and   orientation of my neck cut and then made a neck osteotomy with the femur on traction.  The femoral   head was removed without difficulty or complication.  Traction was let   off and retractors were placed posterior and anterior around the   acetabulum.      The labrum and foveal tissue were debrided.  I began reaming with a 51 mm   reamer and reamed up to 55 mm reamer with good bony bed preparation and a 56 mm  cup was chosen.  The final 56 mm Pinnacle cup was then impacted under fluoroscopy to confirm the depth of penetration and orientation with respect to   Abduction and forward flexion.  A screw was placed into the ilium followed by the hole eliminator.  The final   36+4 neutral Altrex liner was impacted with good visualized rim fit.  The cup was  positioned anatomically within the acetabular portion of the pelvis.      At this point, the femur was rolled to 100 degrees.  Further capsule was   released off the inferior aspect of the femoral neck.  I then   released the superior capsule proximally.  With the leg in a neutral position the hook was placed laterally   along the femur under the vastus lateralis origin and elevated manually and then held in position using the hook attachment on the bed.  The leg was then extended and adducted with the leg rolled to 100   degrees of external rotation.  Retractors were placed along the medial calcar and posteriorly over the greater trochanter.  Once the proximal femur was fully   exposed, I used a box osteotome to set orientation.  I then began   broaching with the starting chili pepper broach and passed this by hand and then broached up to 6.  With the 6 broach in place I chose a high offset neck and did several trial reductions.  The offset was appropriate, leg lengths   appeared to be equal best matched with the +5 head ball trial confirmed radiographically.   Given these findings, I went ahead and dislocated the hip, repositioned all   retractors and positioned the right hip in the extended and abducted position.  The final 6 Hi Actis stem was   chosen and it was impacted down to the level of neck cut.  Based on this   and the trial reductions, a final 36+5 Articuleze metal head ball was chosen and   impacted onto a clean and dry trunnion, and the hip was reduced.  The   hip had been irrigated throughout the case again at this point.  I did   reapproximate the superior capsular leaflet to the anterior leaflet   using #1 Vicryl.  The fascia of the   tensor fascia lata muscle was then reapproximated using #1 Vicryl and #0 Stratafix sutures.  The   remaining wound was closed with 2-0 Vicryl and running 4-0 Monocryl.   The hip was cleaned, dried, and dressed sterilely using Dermabond and    Aquacel dressing.  The patient was then brought   to recovery room in stable condition tolerating the procedure well.    Rosalene Billings, PA-C was present  for the entirety of the case involved from   preoperative positioning, perioperative retractor management, general   facilitation of the case, as well as primary wound closure as assistant.            Madlyn Frankel Charlann Boxer, M.D.        05/23/2022 2:24 PM

## 2022-05-23 NOTE — H&P (Signed)
TOTAL HIP ADMISSION H&P  Patient is admitted for right total hip arthroplasty.  Subjective:  Chief Complaint: right hip pain  HPI: Bryan Rivers, 81 y.o. male, has a history of pain and functional disability in the right hip(s) due to arthritis and patient has failed non-surgical conservative treatments for greater than 12 weeks to include NSAID's and/or analgesics and activity modification.  Onset of symptoms was gradual starting 2 years ago with gradually worsening course since that time.The patient noted no past surgery on the right hip(s).  Patient currently rates pain in the right hip at 7 out of 10 with activity. Patient has worsening of pain with activity and weight bearing, pain that interfers with activities of daily living, and pain with passive range of motion. Patient has evidence of joint space narrowing by imaging studies. This condition presents safety issues increasing the risk of falls. There is no current active infection.  Patient Active Problem List   Diagnosis Date Noted   Aortic atherosclerosis (HCC) 03/08/2021   S/P left THA, AA 10/02/2019   Status post total hip replacement, left 10/02/2019   PAD (peripheral artery disease) (HCC) 02/21/2019   Arthritis of left hip 01/23/2019   Chronic left shoulder pain 12/26/2018   Chronic pain of left knee 12/26/2018   Lower back injury, initial encounter 01/02/2017   Left wrist injury 11/09/2015   Left rib fracture 11/09/2015   Hyperglycemia 11/04/2012   Essential hypertension 09/05/2011   Hyperlipidemia 09/05/2011   Past Medical History:  Diagnosis Date   Aneurysm of left popliteal artery (HCC) 01/31/2019   thrombosed left popliteal artery aneurysm   Arthritis    ED (erectile dysfunction)    Hyperlipemia    Hypertension    Morbid obesity (HCC) 02/11/2013   PAD (peripheral artery disease) (HCC)    Prostate cancer [facility])     Past Surgical History:  Procedure Laterality Date   ABDOMINAL AORTOGRAM W/LOWER EXTREMITY  Bilateral 01/31/2019   Procedure: ABDOMINAL AORTOGRAM W/LOWER EXTREMITY;  Surgeon: Chuck Hint, MD;  Location: [facility] INVASIVE CV LAB;  Service: Cardiovascular;  Laterality: Bilateral;   APPENDECTOMY  1950   age 56   BYPASS GRAFT FEMORAL-PERONEAL Left 02/21/2019   Procedure: BYPASS GRAFT FEMORAL-PERONEAL WITH VEIN GRAFT;  Surgeon: Chuck Hint, MD;  Location: [facility] OR;  Service: Vascular;  Laterality: Left;   COLONOSCOPY     TOTAL HIP ARTHROPLASTY Left 10/02/2019   Procedure: TOTAL HIP ARTHROPLASTY ANTERIOR APPROACH;  Surgeon: Durene Romans, MD;  Location: [facility] ORS;  Service: Orthopedics;  Laterality: Left;  70 mins   WRIST SURGERY Left 2015    No current facility-administered medications for this encounter.   Current Outpatient Medications  Medication Sig Dispense Refill Last Dose   olmesartan-hydrochlorothiazide (BENICAR HCT) 40-25 MG tablet Take 1 tablet by mouth every evening. 90 tablet 2    predniSONE (STERAPRED UNI-PAK 48 TAB) 5 MG (48) TBPK tablet Take by mouth. Taper, take as directed on package.      rosuvastatin (CRESTOR) 40 MG tablet Take 1 tablet (40 mg total) by mouth daily. 90 tablet 2    No Known Allergies  Social History   Tobacco Use   Smoking status: Never   Smokeless tobacco: Never  Substance Use Topics   Alcohol use: Never    Family History  Problem Relation Age of Onset   Heart disease Mother    Diabetes Mother      Review of Systems  Constitutional:  Negative for chills and fever.  Respiratory:  Negative for  cough and shortness of breath.   Cardiovascular:  Negative for chest pain.  Gastrointestinal:  Negative for nausea and vomiting.  Musculoskeletal:  Positive for arthralgias.     Objective:  Physical Exam Well nourished and well developed. General: Alert and oriented x3, cooperative and pleasant, no acute distress. Head: normocephalic, atraumatic, neck supple. Eyes: EOMI.  Musculoskeletal: Right hip exam: Painful and limited hip  flexion internal rotation to 5 degrees with pelvic tilting. Active hip flexion of 120 degrees with slight external rotation contracture  Calves soft and nontender. Motor function intact in LE. Strength 5/5 LE bilaterally. Neuro: Distal pulses 2+. Sensation to light touch intact in LE.  Vital signs in last 24 hours:    Labs:   Estimated body mass index is 29.02 kg/m as calculated from the following:   Height as of 05/11/22: 6' (1.829 m).   Weight as of 05/11/22: 97.1 kg.   Imaging Review Plain radiographs demonstrate severe degenerative joint disease of the right hip(s). The bone quality appears to be adequate for age and reported activity level.      Assessment/Plan:  End stage arthritis, right hip(s)  The patient history, physical examination, clinical judgement of the provider and imaging studies are consistent with end stage degenerative joint disease of the right hip(s) and total hip arthroplasty is deemed medically necessary. The treatment options including medical management, injection therapy, arthroscopy and arthroplasty were discussed at length. The risks and benefits of total hip arthroplasty were presented and reviewed. The risks due to aseptic loosening, infection, stiffness, dislocation/subluxation,  thromboembolic complications and other imponderables were discussed.  The patient acknowledged the explanation, agreed to proceed with the plan and consent was signed. Patient is being admitted for inpatient treatment for surgery, pain control, PT, OT, prophylactic antibiotics, VTE prophylaxis, progressive ambulation and ADL's and discharge planning.The patient is planning to be discharged  home.  Therapy Plans: HEP Disposition: Home with wife Planned DVT Prophylaxis: aspirin 81mg  BID DME needed: none PCP: Dr. Carmelia Roller, clearance received TXA: IV Allergies: NKDA Anesthesia Concerns: none BMI: 32 Last HgbA1c: Not diabetic  Other: - hydrocodone, robaxin, tylenol,  celebrex - Received IM injection for low back pain at H&P   Rosalene Billings, PA-C Orthopedic Surgery [facility] [place] [phone]

## 2022-05-23 NOTE — Interval H&P Note (Signed)
History and Physical Interval Note:  05/23/2022 12:58 PM  Bryan Rivers  has presented today for surgery, with the diagnosis of Right hip osteoarthritis.  The various methods of treatment have been discussed with the patient and family. After consideration of risks, benefits and other options for treatment, the patient has consented to  Procedure(s): TOTAL HIP ARTHROPLASTY ANTERIOR APPROACH (Right) as a surgical intervention.  The patient's history has been reviewed, patient examined, no change in status, stable for surgery.  I have reviewed the patient's chart and labs.  Questions were answered to the patient's satisfaction.     Shelda Pal

## 2022-05-24 ENCOUNTER — Encounter (HOSPITAL_COMMUNITY): Payer: Self-pay | Admitting: Orthopedic Surgery

## 2022-05-24 DIAGNOSIS — M1611 Unilateral primary osteoarthritis, right hip: Secondary | ICD-10-CM | POA: Diagnosis not present

## 2022-05-24 LAB — CBC
HCT: 38.3 % — ABNORMAL LOW (ref 39.0–52.0)
Hemoglobin: 12.7 g/dL — ABNORMAL LOW (ref 13.0–17.0)
MCH: 28.7 pg (ref 26.0–34.0)
MCHC: 33.2 g/dL (ref 30.0–36.0)
MCV: 86.5 fL (ref 80.0–100.0)
Platelets: 154 10*3/uL (ref 150–400)
RBC: 4.43 MIL/uL (ref 4.22–5.81)
RDW: 13.8 % (ref 11.5–15.5)
WBC: 11.3 10*3/uL — ABNORMAL HIGH (ref 4.0–10.5)
nRBC: 0 % (ref 0.0–0.2)

## 2022-05-24 LAB — BASIC METABOLIC PANEL
Anion gap: 8 (ref 5–15)
BUN: 20 mg/dL (ref 8–23)
CO2: 26 mmol/L (ref 22–32)
Calcium: 8.9 mg/dL (ref 8.9–10.3)
Chloride: 104 mmol/L (ref 98–111)
Creatinine, Ser: 0.91 mg/dL (ref 0.61–1.24)
GFR, Estimated: >60 mL/min (ref >60)
Glucose, Bld: 169 mg/dL — ABNORMAL HIGH (ref 70–99)
Potassium: 4.4 mmol/L (ref 3.5–5.1)
Sodium: 138 mmol/L (ref 135–145)

## 2022-05-24 MED ORDER — HYDROCODONE-ACETAMINOPHEN 5-325 MG PO TABS
1.0000 | ORAL_TABLET | ORAL | 0 refills | Status: DC | PRN
Start: 2022-05-24 — End: 2023-07-11

## 2022-05-24 MED ORDER — ASPIRIN 81 MG PO CHEW
81.0000 mg | CHEWABLE_TABLET | Freq: Two times a day (BID) | ORAL | 0 refills | Status: AC
Start: 2022-05-24 — End: 2022-06-21

## 2022-05-24 MED ORDER — METHOCARBAMOL 500 MG PO TABS: 500.0000 mg | ORAL_TABLET | Freq: Four times a day (QID) | ORAL | 0 refills | Status: DC | PRN

## 2022-05-24 MED ORDER — POLYETHYLENE GLYCOL 3350 17 G PO PACK: 17.0000 g | PACK | Freq: Every day | ORAL | 0 refills | Status: DC | PRN

## 2022-05-24 MED ORDER — DOCUSATE SODIUM 100 MG PO CAPS: 100.0000 mg | ORAL_CAPSULE | Freq: Two times a day (BID) | ORAL | 0 refills | Status: DC

## 2022-05-24 NOTE — TOC Transition Note (Signed)
Transition of Care [facility]) - CM/SW Discharge Note  Patient Details  Name: Bryan Rivers MRN: [id number] Date of Birth: [birth year]  Transition of [facility]) CM/SW Contact:  Sherie Don, LCSW Phone Number: 05/24/2022, 10:26 AM  Clinical Narrative: Patient is expected to discharge home after working with PT. CSW met with patient to confirm discharge plan. Patient will go home with a home exercise program (HEP). Patient has a rolling walker at home, so there are no DME needs at this time. TOC signing off.  Final next level of care: Home/Self Care Barriers to Discharge: No Barriers Identified  Patient Goals and CMS Choice Patient states their goals for this hospitalization and ongoing recovery are:: Discharge home with HEP Choice offered to / list presented to : NA  Discharge Plan and Services        DME Arranged: N/A DME Agency: NA  Readmission Risk Interventions     No data to display

## 2022-05-24 NOTE — Progress Notes (Signed)
   Subjective: 1 Day Post-Op Procedure(s) (LRB): TOTAL HIP ARTHROPLASTY ANTERIOR APPROACH (Right) Patient reports pain as mild.   Patient seen in rounds by Dr. Alvan Dame. Patient is resting in bed on exam this morning. No acute events overnight. He has not been up with PT yet.  We will start therapy today.   Objective: Vital signs in last 24 hours: Temp:  [97.6 F (36.4 C)-98.9 F (37.2 C)] 98.2 F (36.8 C) (06/28 0604) Pulse Rate:  [56-72] 66 (06/28 0604) Resp:  [10-19] 18 (06/28 0604) BP: (125-164)/(56-84) 125/71 (06/28 0604) SpO2:  [93 %-99 %] 93 % (06/28 0604) Weight:  [97.1 kg] 97.1 kg (06/27 1218)  Intake/Output from previous day:  Intake/Output Summary (Last 24 hours) at 05/24/2022 0754 Last data filed at 05/24/2022 0748 Gross per 24 hour  Intake 2914.6 ml  Output 2750 ml  Net 164.6 ml     Intake/Output this shift: Total I/O In: 240 [P.O.:240] Out: -   Labs: Recent Labs    05/24/22 0258  HGB 12.7*   Recent Labs    05/24/22 0258  WBC 11.3*  RBC 4.43  HCT 38.3*  PLT 154   Recent Labs    05/24/22 0258  NA 138  K 4.4  CL 104  CO2 26  BUN 20  CREATININE 0.91  GLUCOSE 169*  CALCIUM 8.9   No results for input(s): "LABPT", "INR" in the last 72 hours.  Exam: General - Patient is Alert and Oriented Extremity - Neurologically intact Sensation intact distally Intact pulses distally Dorsiflexion/Plantar flexion intact Dressing - dressing C/D/I Motor Function - intact, moving foot and toes well on exam.   Past Medical History:  Diagnosis Date   Aneurysm of left popliteal artery ([place]) 01/31/2019   thrombosed left popliteal artery aneurysm   Arthritis    ED (erectile dysfunction)    Hyperlipemia    Hypertension    Morbid obesity ([place]) 02/11/2013   PAD (peripheral artery disease) (HCC)    Prostate cancer (HCC)     Assessment/Plan: 1 Day Post-Op Procedure(s) (LRB): TOTAL HIP ARTHROPLASTY ANTERIOR APPROACH (Right) Principal Problem:   S/P total  right hip arthroplasty  Estimated body mass index is 29.02 kg/m as calculated from the following:   Height as of this encounter: 6' (1.829 m).   Weight as of this encounter: 97.1 kg. Advance diet Up with therapy D/C IV fluids  DVT Prophylaxis - Aspirin Weight bearing as tolerated.  Hgb stable at 12.7 this AM.  Plan is to go Home after hospital stay. Plan for discharge today after meeting goals with therapy. Follow up in the office in 2 weeks.   Griffith Citron, PA-C Orthopedic Surgery [phone] 05/24/2022, 7:54 AM

## 2022-05-24 NOTE — Evaluation (Signed)
Physical Therapy Evaluation Patient Details Name: Bryan Rivers MRN: [id number] DOB: 1941-04-12 Today's Date: 05/24/2022  History of Present Illness  81 yo male s/p R THA-DA 6/27. Hx L THA 2020  Clinical Impression  On eval, pt was Supv level for mobility. He walked ~250 feet with a RW and ascended/descended 1 stair step. Reviewed/practiced exercises-issued HEP. All education completed.        Recommendations for follow up therapy are one component of a multi-disciplinary discharge planning process, led by the attending physician.  Recommendations may be updated based on patient status, additional functional criteria and insurance authorization.  Follow Up Recommendations Follow physician's recommendations for discharge plan and follow up therapies      Assistance Recommended at Discharge PRN  Patient can return home with the following  Assistance with cooking/housework;Assist for transportation    Equipment Recommendations None recommended by PT  Recommendations for Other Services       Functional Status Assessment Patient has had a recent decline in their functional status and demonstrates the ability to make significant improvements in function in a reasonable and predictable amount of time.     Precautions / Restrictions Precautions Precautions: Fall Restrictions Weight Bearing Restrictions: No      Mobility  Bed Mobility Overal bed mobility: Needs Assistance Bed Mobility: Supine to Sit     Supine to sit: Supervision          Transfers Overall transfer level: Needs assistance   Transfers: Sit to/from Stand Sit to Stand: Supervision                Ambulation/Gait Ambulation/Gait assistance: Supervision Gait Distance (Feet): 250 Feet Assistive device: Rolling walker (2 wheels) Gait Pattern/deviations: Step-through pattern       General Gait Details: Pt denied dizziness. Tolerated distance well.  Stairs Stairs: Yes Supervision    Number of  Stairs: 1 General stair comments: Cues for safety, technique, sequence. Supv for safety.  Wheelchair Mobility    Modified Rankin (Stroke Patients Only)       Balance Overall balance assessment: Mild deficits observed, not formally tested                                           Pertinent Vitals/Pain Pain Assessment Pain Assessment: 0-10 Pain Score: 4  Pain Location: R hip Pain Descriptors / Indicators: Discomfort, Sore Pain Intervention(s): Monitored during session, Ice applied    Home Living Family/patient expects to be discharged to:: Private residence Living Arrangements: Spouse/significant other Available Help at Discharge: Family Type of Home: House Home Access: Stairs to enter Entrance Stairs-Rails: None Technical brewer of Steps: 1   Home Layout: One level Home Equipment: Conservation officer, nature (2 wheels);BSC/3in1;Rollator (4 wheels)      Prior Function Prior Level of Function : Independent/Modified Independent                     Hand Dominance        Extremity/Trunk Assessment   Upper Extremity Assessment Upper Extremity Assessment: Overall WFL for tasks assessed    Lower Extremity Assessment Lower Extremity Assessment: Generalized weakness    Cervical / Trunk Assessment Cervical / Trunk Assessment: Normal  Communication   Communication: No difficulties  Cognition Arousal/Alertness: Awake/alert Behavior During Therapy: WFL for tasks assessed/performed Overall Cognitive Status: Within Functional Limits for tasks assessed  General Comments      Exercises Total Joint Exercises Ankle Circles/Pumps: AROM, Both, 10 reps Quad Sets: AROM, Both, 10 reps Heel Slides: AROM, Right, 10 reps Hip ABduction/ADduction: AROM, Right, 10 reps, Standing Long Arc Quad: AROM, Right, 10 reps, Seated Knee Flexion: AROM, Right, 10 reps, Standing Marching in Standing: AROM, Both,  10 reps, Standing   Assessment/Plan    PT Assessment Patient needs continued PT services  PT Problem List Decreased strength;Decreased mobility;Decreased range of motion;Decreased balance;Decreased knowledge of use of DME;Decreased activity tolerance;Pain       PT Treatment Interventions DME instruction;Gait training;Therapeutic activities;Therapeutic exercise;Patient/family education;Balance training;Stair training;Functional mobility training    PT Goals (Current goals can be found in the Care Plan section)  Acute Rehab PT Goals Patient Stated Goal: home! regain PLOF/ind/return to sporting activities PT Goal Formulation: With patient Time For Goal Achievement: 06/07/22 Potential to Achieve Goals: Good    Frequency 7X/week     Co-evaluation               AM-PAC PT "6 Clicks" Mobility  Outcome Measure Help needed turning from your back to your side while in a flat bed without using bedrails?: None Help needed moving from lying on your back to sitting on the side of a flat bed without using bedrails?: None Help needed moving to and from a bed to a chair (including a wheelchair)?: None Help needed standing up from a chair using your arms (e.g., wheelchair or bedside chair)?: None Help needed to walk in hospital room?: A Little Help needed climbing 3-5 steps with a railing? : A Little 6 Click Score: 22    End of Session Equipment Utilized During Treatment: Gait belt Activity Tolerance: Patient tolerated treatment well Patient left: in chair;with call bell/phone within reach   PT Visit Diagnosis: Other abnormalities of gait and mobility (R26.89)    Time: 3818-2993 PT Time Calculation (min) (ACUTE ONLY): 37 min   Charges:   PT Evaluation $PT Eval Low Complexity: 1 Low PT Treatments $Gait Training: 8-22 mins           Doreatha Massed, PT Acute Rehabilitation  Office: [phone] Pager: [phone]

## 2022-05-24 NOTE — Plan of Care (Signed)
  Problem: Education: Goal: Knowledge of General Education information will improve Description: Including pain rating scale, medication(s)/side effects and non-pharmacologic comfort measures Outcome: Adequate for Discharge   Problem: Health Behavior/Discharge Planning: Goal: Ability to manage health-related needs will improve Outcome: Adequate for Discharge   Problem: Clinical Measurements: Goal: Ability to maintain clinical measurements within normal limits will improve Outcome: Adequate for Discharge Goal: Will remain free from infection Outcome: Adequate for Discharge Goal: Diagnostic test results will improve Outcome: Adequate for Discharge Goal: Respiratory complications will improve Outcome: Adequate for Discharge Goal: Cardiovascular complication will be avoided Outcome: Adequate for Discharge   Problem: Activity: Goal: Risk for activity intolerance will decrease Outcome: Adequate for Discharge   Problem: Nutrition: Goal: Adequate nutrition will be maintained Outcome: Adequate for Discharge   Problem: Coping: Goal: Level of anxiety will decrease Outcome: Adequate for Discharge   Problem: Elimination: Goal: Will not experience complications related to bowel motility Outcome: Adequate for Discharge Goal: Will not experience complications related to urinary retention Outcome: Adequate for Discharge   Problem: Pain Managment: Goal: General experience of comfort will improve Outcome: Adequate for Discharge   Problem: Safety: Goal: Ability to remain free from injury will improve Outcome: Adequate for Discharge   Problem: Skin Integrity: Goal: Risk for impaired skin integrity will decrease Outcome: Adequate for Discharge   Problem: Education: Goal: Knowledge of the prescribed therapeutic regimen will improve Outcome: Adequate for Discharge Goal: Understanding of discharge needs will improve Outcome: Adequate for Discharge Goal: Individualized Educational  Video(s) Outcome: Adequate for Discharge   Problem: Activity: Goal: Ability to avoid complications of mobility impairment will improve Outcome: Adequate for Discharge Goal: Ability to tolerate increased activity will improve Outcome: Adequate for Discharge   Problem: Clinical Measurements: Goal: Postoperative complications will be avoided or minimized Outcome: Adequate for Discharge   Problem: Pain Management: Goal: Pain level will decrease with appropriate interventions Outcome: Adequate for Discharge   Problem: Education: Goal: Knowledge of the prescribed therapeutic regimen will improve Outcome: Adequate for Discharge Goal: Understanding of discharge needs will improve Outcome: Adequate for Discharge Goal: Individualized Educational Video(s) Outcome: Adequate for Discharge   Problem: Activity: Goal: Ability to avoid complications of mobility impairment will improve Outcome: Adequate for Discharge Goal: Ability to tolerate increased activity will improve Outcome: Adequate for Discharge   Problem: Clinical Measurements: Goal: Postoperative complications will be avoided or minimized Outcome: Adequate for Discharge   Problem: Pain Management: Goal: Pain level will decrease with appropriate interventions Outcome: Adequate for Discharge

## 2022-05-31 NOTE — Discharge Summary (Signed)
Patient ID: Bryan Rivers MRN: [id number] DOB/AGE: June 13, 1941 81 y.o.  Admit date: 05/23/2022 Discharge date: 05/24/2022  Admission Diagnoses:  Right hip osteoarthritis  Discharge Diagnoses:  Principal Problem:   S/P total right hip arthroplasty   Past Medical History:  Diagnosis Date   Aneurysm of left popliteal artery ([place]) 01/31/2019   thrombosed left popliteal artery aneurysm   Arthritis    ED (erectile dysfunction)    Hyperlipemia    Hypertension    Morbid obesity ([place]) 02/11/2013   PAD (peripheral artery disease) ([place])    Prostate cancer ([place])     Surgeries: Procedure(s): TOTAL HIP ARTHROPLASTY ANTERIOR APPROACH on 05/23/2022   Consultants:   Discharged Condition: Improved  Hospital Course: Bryan Rivers is an 81 y.o. male who was admitted 05/23/2022 for operative treatment ofS/P total right hip arthroplasty. Patient has severe unremitting pain that affects sleep, daily activities, and work/hobbies. After pre-op clearance the patient was taken to the operating room on 05/23/2022 and underwent  Procedure(s): TOTAL HIP ARTHROPLASTY ANTERIOR APPROACH.    Patient was given perioperative antibiotics:  Anti-infectives (From admission, onward)    Start     Dose/Rate Route Frequency Ordered Stop   05/23/22 2000  ceFAZolin (ANCEF) IVPB 2g/100 mL premix        2 g 200 mL/hr over 30 Minutes Intravenous Every 6 hours 05/23/22 1727 05/24/22 0321   05/23/22 1145  ceFAZolin (ANCEF) IVPB 2g/100 mL premix        2 g 200 mL/hr over 30 Minutes Intravenous On call to O.R. 05/23/22 1142 05/23/22 1424        Patient was given sequential compression devices, early ambulation, and chemoprophylaxis to prevent DVT. Patient worked with PT and was meeting their goals regarding safe ambulation and transfers.  Patient benefited maximally from hospital stay and there were no complications.    Recent vital signs: No data found.   Recent laboratory studies: No results for input(s): "WBC",  "HGB", "HCT", "PLT", "NA", "K", "CL", "CO2", "BUN", "CREATININE", "GLUCOSE", "INR", "CALCIUM" in the last 72 hours.  Invalid input(s): "PT", "2"   Discharge Medications:   Allergies as of 05/24/2022   No Known Allergies      Medication List     STOP taking these medications    predniSONE 5 MG (48) Tbpk tablet Commonly known as: STERAPRED UNI-PAK 48 TAB       TAKE these medications    aspirin 81 MG chewable tablet Chew 1 tablet (81 mg total) by mouth 2 (two) times daily for 28 days.   docusate sodium 100 MG capsule Commonly known as: COLACE Take 1 capsule (100 mg total) by mouth 2 (two) times daily.   HYDROcodone-acetaminophen 5-325 MG tablet Commonly known as: NORCO/VICODIN Take 1 tablet by mouth every 4 (four) hours as needed for severe pain.   methocarbamol 500 MG tablet Commonly known as: ROBAXIN Take 1 tablet (500 mg total) by mouth every 6 (six) hours as needed for muscle spasms (muscle pain).   olmesartan-hydrochlorothiazide 40-25 MG tablet Commonly known as: BENICAR HCT Take 1 tablet by mouth every evening.   polyethylene glycol 17 g packet Commonly known as: MIRALAX / GLYCOLAX Take 17 g by mouth daily as needed for mild constipation.   rosuvastatin 40 MG tablet Commonly known as: CRESTOR Take 1 tablet (40 mg total) by mouth daily.               Discharge Care Instructions  (From admission, onward)  Start     Ordered   05/24/22 0000  Change dressing       Comments: Maintain surgical dressing until follow up in the clinic. If the edges start to pull up, may reinforce with tape. If the dressing is no longer working, may remove and cover with gauze and tape, but must keep the area dry and clean.  Call with any questions or concerns.   05/24/22 0757            Diagnostic Studies: DG Pelvis Portable  Result Date: 05/23/2022 CLINICAL DATA:  Status post right hip arthroplasty EXAM: PORTABLE PELVIS 1-2 VIEWS COMPARISON:  10/02/2019  FINDINGS: There is recent right hip arthroplasty. There is previous left hip arthroplasty. No fractures are seen. Degenerative changes are noted in the lower lumbar spine. Surgical clips are seen overlying the pubic bone, possibly in the prostate. IMPRESSION: Status post right hip arthroplasty. Electronically Signed   By: Elmer Picker M.D.   On: 05/23/2022 16:41   DG HIP UNILAT WITH PELVIS 1V RIGHT  Result Date: 05/23/2022 CLINICAL DATA:  Total hip replacement EXAM: DG HIP (WITH OR WITHOUT PELVIS) 1V RIGHT COMPARISON:  03/21/2022 FINDINGS: Multiple intraoperative views demonstrate remote left hip arthroplasty. Placement of a right hip arthroplasty, without hardware complication or periprosthetic fracture. IMPRESSION: Intraoperative imaging of right hip arthroplasty. Electronically Signed   By: Abigail Miyamoto M.D.   On: 05/23/2022 15:59   DG C-Arm 1-60 Min-No Report  Result Date: 05/23/2022 Fluoroscopy was utilized by the requesting physician.  No radiographic interpretation.    Disposition: Discharge disposition: 01-Home or Self Care       Discharge Instructions     Call MD / Call 911   Complete by: As directed    If you experience chest pain or shortness of breath, CALL 911 and be transported to the hospital emergency room.  If you develope a fever above 101 F, pus (white drainage) or increased drainage or redness at the wound, or calf pain, call your surgeon's office.   Change dressing   Complete by: As directed    Maintain surgical dressing until follow up in the clinic. If the edges start to pull up, may reinforce with tape. If the dressing is no longer working, may remove and cover with gauze and tape, but must keep the area dry and clean.  Call with any questions or concerns.   Constipation Prevention   Complete by: As directed    Drink plenty of fluids.  Prune juice may be helpful.  You may use a stool softener, such as Colace (over the counter) 100 mg twice a day.  Use MiraLax  (over the counter) for constipation as needed.   Diet - low sodium heart healthy   Complete by: As directed    Increase activity slowly as tolerated   Complete by: As directed    Weight bearing as tolerated with assist device (walker, cane, etc) as directed, use it as long as suggested by your surgeon or therapist, typically at least 4-6 weeks.   Post-operative opioid taper instructions:   Complete by: As directed    POST-OPERATIVE OPIOID TAPER INSTRUCTIONS: It is important to wean off of your opioid medication as soon as possible. If you do not need pain medication after your surgery it is ok to stop day one. Opioids include: Codeine, Hydrocodone(Norco, Vicodin), Oxycodone(Percocet, oxycontin) and hydromorphone amongst others.  Long term and even short term use of opiods can cause: Increased pain response Dependence Constipation Depression Respiratory  depression And more.  Withdrawal symptoms can include Flu like symptoms Nausea, vomiting And more Techniques to manage these symptoms Hydrate well Eat regular healthy meals Stay active Use relaxation techniques(deep breathing, meditating, yoga) Do Not substitute Alcohol to help with tapering If you have been on opioids for less than two weeks and do not have pain than it is ok to stop all together.  Plan to wean off of opioids This plan should start within one week post op of your joint replacement. Maintain the same interval or time between taking each dose and first decrease the dose.  Cut the total daily intake of opioids by one tablet each day Next start to increase the time between doses. The last dose that should be eliminated is the evening dose.      TED hose   Complete by: As directed    Use stockings (TED hose) for 2 weeks on both leg(s).  You may remove them at night for sleeping.        Follow-up Information     Paralee Cancel, MD. Go on 06/07/2022.   Specialty: Orthopedic Surgery Why: You are scheduled for  first post op appointment on Wednesday July 12th at 10:30am. Contact information: [address] [place] [address] [phone]                  Signed: Irving Copas 05/31/2022, 9:24 AM

## 2022-10-03 ENCOUNTER — Other Ambulatory Visit: Payer: Self-pay | Admitting: Family Medicine

## 2022-10-16 ENCOUNTER — Telehealth: Payer: Self-pay | Admitting: Family Medicine

## 2022-10-16 NOTE — Telephone Encounter (Signed)
Left message for patient to call back and schedule Medicare Annual Wellness Visit (AWV) either virtually or phone.  Left  my Bryan Rivers number [phone]   Last AWV  10/06/21 please schedule with Nurse Health Adviser   45 min for awv-i and in office appointments 30 min for awv-s  phone/virtual appointments

## 2022-10-26 ENCOUNTER — Encounter: Payer: Self-pay | Admitting: Family Medicine

## 2022-10-31 ENCOUNTER — Telehealth (INDEPENDENT_AMBULATORY_CARE_PROVIDER_SITE_OTHER): Payer: Medicare Other | Admitting: Family Medicine

## 2022-10-31 VITALS — Ht 71.0 in | Wt 214.0 lb

## 2022-10-31 DIAGNOSIS — Z0000 Encounter for general adult medical examination without abnormal findings: Secondary | ICD-10-CM

## 2022-10-31 NOTE — Progress Notes (Signed)
PATIENT CHECK-IN and HEALTH RISK ASSESSMENT QUESTIONNAIRE:  -completed by phone/video for upcoming Medicare Preventive Visit  Pre-Visit Check-in: 1)Vitals (height, wt, BP, etc) - record in vitals section for visit on day of visit 2)Review and Update Medications, Allergies PMH, Surgeries, Social history in Epic 3)Hospitalizations in the last year with date/reason? End of April of this year for Right Hip ablasion.   4)Review and Update Care Team (patient's specialists) in Epic 5) Complete PHQ9 in Epic  6) Complete Fall Screening in Epic 7)Review all Health Maintenance Due and order under PCP if not done.  Medicare Wellness Patient Questionnaire:  Answer theses question about your habits: Do you drink alcohol? No If yes, how many drinks do you have a day?n/a Have you ever smoked?No Quit date if applicable? N/a  How many packs a day do/did you smoke? N/a Do you use smokeless tobacco?n/a Do you use an illicit drugs?n/a Do you exercises? Yes IF so, what type and how many days/minutes per week?He reports he goes to gym-stretch, cardio on treadmill,3 times a week, 45-50 mins, also golfs 3 x per week Are you sexually active? No Number of partners?n/a Typical breakfast: greek yogurt and fruit, granula  Typical lunch: bread, cheese, tomatoes Typical dinner: potato, chicken, vegetable Typical snacks:no snacks  Beverages: soda water, tea, water, orange juice, cranberry juice  Answer theses question about you: Can you perform most household chores?yes Do you find it hard to follow a conversation in a noisy room?no Do you often ask people to speak up or repeat themselves?no Do you feel that you have a problem with memory?no Do you balance your checkbook and or bank acounts?wife took over Do you feel safe at home?yes Last dentist visit?yesterday went for deep cleaning. Do you need assistance with any of the following: Please note if so No  Driving?  Feeding yourself?  Getting from bed to  chair?  Getting to the toilet?  Bathing or showering?  Dressing yourself?  Managing money?  Climbing a flight of stairs  Preparing meals?    Do you have Advanced Directives in place (Living Will, Healthcare Power or Attorney)? Yes   Last eye Exam and location?May of 2023, [facility] in [place] you currently use prescribed or non-prescribed narcotic or opioid pain medications?no  Do you have a history or close family history of breast, ovarian, tubal or peritoneal cancer or a family member with BRCA (breast cancer susceptibility 1 and 2) gene mutations? No  Nurse/Assistant Credentials/time stamp: Karpuih M./CMA/11:56am   ----------------------------------------------------------------------------------------------------------------------------------------------------------------------------------------------------------------------    MEDICARE ANNUAL PREVENTIVE CARE VISIT WITH PROVIDER (Welcome to Medicare, initial annual wellness or annual wellness exam)  Virtual Visit via Video Note  I connected with [employer]  [employer] 10/31/22 by a video enabled telemedicine application and verified that I am speaking with the correct person using two identifiers.  Location patient: home Location provider:work or home office Persons participating in the virtual visit: patient, provider  Concerns and/or follow up today: hip operation 5 months ago and reports recovered great.   See HM section in Epic for other details of completed HM.    ROS: negative for report of fevers, unintentional weight loss, vision changes, vision loss, hearing loss or change, chest pain, sob, hemoptysis, melena, hematochezia, hematuria, genital discharge or lesions, falls, bleeding or bruising, loc, thoughts of suicide or self harm, memory loss  Patient-completed extensive health risk assessment - reviewed and discussed with the patient: See Health Risk Assessment completed with patient prior to the  visit either above or in recent phone note. This was reviewed in detailed with the patient today and appropriate recommendations, orders and referrals were placed as needed per Summary below and patient instructions.   Review of Medical History: -PMH, PSH, Family History and current specialty and care providers reviewed and updated and listed below   Patient Care Team: Shelda Pal, DO as PCP - General (Family Medicine)   Past Medical History:  Diagnosis Date   Aneurysm of left popliteal artery ([place]) 01/31/2019   thrombosed left popliteal artery aneurysm   Arthritis    ED (erectile dysfunction)    Hyperlipemia    Hypertension    Morbid obesity ([place]) 02/11/2013   PAD (peripheral artery disease) ([place])    Prostate cancer [facility])     Past Surgical History:  Procedure Laterality Date   ABDOMINAL AORTOGRAM W/LOWER EXTREMITY Bilateral 01/31/2019   Procedure: ABDOMINAL AORTOGRAM W/LOWER EXTREMITY;  Surgeon: Angelia Mould, MD;  Location: [facility];  Service: Cardiovascular;  Laterality: Bilateral;   APPENDECTOMY  1950   age 81   BYPASS GRAFT FEMORAL-PERONEAL Left 02/21/2019   Procedure: BYPASS GRAFT FEMORAL-PERONEAL WITH VEIN GRAFT;  Surgeon: Angelia Mould, MD;  Location: [facility];  Service: Vascular;  Laterality: Left;   COLONOSCOPY     TOTAL HIP ARTHROPLASTY Left 10/02/2019   Procedure: TOTAL HIP ARTHROPLASTY ANTERIOR APPROACH;  Surgeon: Paralee Cancel, MD;  Location: [facility];  Service: Orthopedics;  Laterality: Left;  70 mins   TOTAL HIP ARTHROPLASTY Right 05/23/2022   Procedure: TOTAL HIP ARTHROPLASTY ANTERIOR APPROACH;  Surgeon: Paralee Cancel, MD;  Location: [facility];  Service: Orthopedics;  Laterality: Right;   WRIST SURGERY Left 2015    Social History   Socioeconomic History   Marital status: Married    Spouse name: Not on file   Number of children: Not on file   Years of education: Not on file   Highest education level: Not on file  Occupational  History   Not on file  Tobacco Use   Smoking status: Never   Smokeless tobacco: Never  Vaping Use   Vaping Use: Never used  Substance and Sexual Activity   Alcohol use: Never   Drug use: No   Sexual activity: Not on file  Other Topics Concern   Not on file  Social History Narrative   Married 81 years   1 daughter age 32   Retired   Social Determinants of Radio broadcast assistant Strain: [place]  (10/06/2021)   Overall Financial Resource Strain (CARDIA)    Difficulty of Paying Living Expenses: Not hard at all  Food Insecurity: No [place] (10/06/2021)   Hunger Vital Sign    Worried About Running Out of Food in the Last Year: Never true    [place] in the Last Year: Never true  Transportation Needs: No Transportation Needs (10/06/2021)   PRAPARE - Hydrologist (Medical): No    Lack of Transportation (Non-Medical): No  Physical Activity: Sufficiently Active (10/06/2021)   Exercise Vital Sign    Days of Exercise per Week: 3 days    Minutes of Exercise per Session: 90 min  Stress: No Stress Concern Present (10/06/2021)   [place]    Feeling of Stress : Not at all  Social Connections: Moderately Integrated (10/06/2021)   Social Connection and Isolation Panel [NHANES]    Frequency of Communication with Friends and  Family: More than three times a week    Frequency of Social Gatherings with Friends and Family: Once a week    Attends Religious Services: Never    Marine scientist or Organizations: Yes    Attends Music therapist: More than 4 times per year    Marital Status: Married  Human resources officer Violence: Not At Risk (10/06/2021)   Humiliation, Afraid, Rape, and Kick questionnaire    Fear of Current or Ex-Partner: No    Emotionally Abused: No    Physically Abused: No    Sexually Abused: No    Family History  Problem Relation Age of Onset    Heart disease Mother    Diabetes Mother     Current Outpatient Medications on File Prior to Visit  Medication Sig Dispense Refill   aspirin EC 81 MG tablet Take by mouth daily.     olmesartan-hydrochlorothiazide (BENICAR HCT) 40-25 MG tablet TAKE ONE TABLET BY MOUTH EVERY EVENING 90 tablet 2   rosuvastatin (CRESTOR) 40 MG tablet TAKE ONE TABLET BY MOUTH ONE TIME DAILY 90 tablet 2   docusate sodium (COLACE) 100 MG capsule Take 1 capsule (100 mg total) by mouth 2 (two) times daily. (Patient not taking: Reported on 10/31/2022) 10 capsule 0   HYDROcodone-acetaminophen (NORCO/VICODIN) 5-325 MG tablet Take 1 tablet by mouth every 4 (four) hours as needed for severe pain. (Patient not taking: Reported on 10/31/2022) 42 tablet 0   methocarbamol (ROBAXIN) 500 MG tablet Take 1 tablet (500 mg total) by mouth every 6 (six) hours as needed for muscle spasms (muscle pain). (Patient not taking: Reported on 10/31/2022) 40 tablet 0   polyethylene glycol (MIRALAX / GLYCOLAX) 17 g packet Take 17 g by mouth daily as needed for mild constipation. (Patient not taking: Reported on 10/31/2022) 14 each 0   No current facility-administered medications on file prior to visit.    No Known Allergies     Physical Exam There were no vitals filed for this visit. Estimated body mass index is 29.85 kg/m as calculated from the following:   Height as of this encounter: 5' 11" (1.803 m).   Weight as of this encounter: 214 lb (97.1 kg).  EKG (optional): deferred due to virtual visit  GENERAL: alert, oriented, appears well and in no acute distress; visual acuity grossly intact, full vision exam deferred due to pandemic and/or virtual encounter  HEENT: atraumatic, conjunttiva clear, no obvious abnormalities on inspection of external nose and ears  NECK: normal movements of the head and neck  LUNGS: on inspection no signs of respiratory distress, breathing rate appears normal, no obvious gross SOB, gasping or  wheezing  CV: no obvious cyanosis  MS: moves all visible extremities without noticeable abnormality  PSYCH/NEURO: pleasant and cooperative, no obvious depression or anxiety, speech and thought processing grossly intact, Cognitive function grossly intact  Flowsheet Row Video Visit from 10/31/2022 in [facility]  PHQ-9 Total Score 0           10/31/2022   11:43 AM 01/03/2022    1:18 PM 10/06/2021    2:29 PM 05/17/2021   10:27 AM 11/16/2020   10:52 AM  Depression screen PHQ 2/9  Decreased Interest 0 0 0 0 0  Down, Depressed, Hopeless 0 0 0 0 0  PHQ - 2 Score 0 0 0 0 0  Altered sleeping 0      Tired, decreased energy 0      Change in appetite 0  Feeling bad or failure about yourself  0      Trouble concentrating 0      Moving slowly or fidgety/restless 0      Suicidal thoughts 0      PHQ-9 Score 0      Difficult doing work/chores Not difficult at all           01/03/2022    1:17 PM 05/23/2022    6:00 PM 05/23/2022    7:49 PM 05/24/2022   10:30 AM 10/31/2022   11:44 AM  Fall Risk  Falls in the past year? 0    0  Was there an injury with Fall? 0    0  Fall Risk Category Calculator 0    0  Fall Risk Category Low    Low  Patient Fall Risk Level Low fall risk High fall risk High fall risk High fall risk Low fall risk  Patient at Risk for Falls Due to No Fall Risks    No Fall Risks  Fall risk Follow up Falls evaluation completed    Falls evaluation completed     SUMMARY AND PLAN:  Medicare annual wellness visit, subsequent     Discussed applicable health maintenance/preventive health measures and advised and referred or ordered per patient preferences:  Health Maintenance  Topic Date Due   DTaP/Tdap/Td (1 - Tdap) He had a reaction to a tetanus vaccine remotely once and declines. Agrees to seek care promptly if any cuts, wounds, bites, etc.    INFLUENZA VACCINE  Postponed per pt   Medicare Annual Wellness (AWV)  10/06/2022   Pneumonia Vaccine 27+  Years old  Completed   Hepatitis C Screening  Completed   HPV VACCINES  Aged Out   COLONOSCOPY (Pts 45-36yr Insurance coverage will need to be confirmed)  Discontinued   COVID-19 Vaccine  Discontinued   Zoster Vaccines- Shingrix  Discontinued    Education and counseling on the following was provided based on the above review of health and a plan/checklist for the patient, along with additional information discussed, was provided for the patient in the patient instructions :   -Advised and counseled on maintaining healthy weight and healthy lifestyle - including the importance of a health diet, regular physical activity, social connections and stress management. -Advised and counseled on a whole foods based healthy diet and regular exercise:  -A summary of a healthy diet was provided in the Patient Instructions. - Recommended continued regular exercise and discussed options within the community.  -Advised yearly dental visits at minimum and regular eye exams   Follow up: see patient instructions   Patient Instructions  I really enjoyed getting to talk with you today! I am available on Tuesdays and Thursdays for virtual visits if you have any questions or concerns, or if I can be of any further assistance.   CHECKLIST FROM ANNUAL WELLNESS VISIT:  -Follow up (please call to schedule if not scheduled after visit):  -Inperson visit with your Primary Doctor office: yearly and per recommendations from your doctor -yearly for annual wellness visit with primary care office  Here is a list of your preventive care/health maintenance measures and the plan for each if any are due:  Health Maintenance  Topic Date Due   DTaP/Tdap/Td (1 - Tdap) Deferred per your wishes and prior reaction   INFLUENZA VACCINE  Due annually   Medicare Annual Wellness (AWV)  11/01/2023   Pneumonia Vaccine 79 Years old  Completed   Hepatitis C Screening  Completed  HPV VACCINES  Aged Out   COLONOSCOPY (Pts  45-27yr Insurance coverage will need to be confirmed)  Discontinued   COVID-19 Vaccine  Discontinued   Zoster Vaccines- Shingrix  Discontinued    -See a dentist at least yearly  -Get your eyes checked and then per your eye specialist's recommendations  -Other issues addressed today:  -I have included below further information regarding a healthy whole foods based diet, physical activity guidelines for adults, stress management and opportunities for social connections. I hope you find this information useful.   -----------------------------------------------------------------------------------------------------------------------------------------------------------------------------------------------------------------------------------------------------------  FOOD - THE FUEL FOR A HAPPY HEALTHY LIFE: -eat real food: lots of colorful vegetables (half the plate) and fruits -5-7 servings of vegetables and fruits per day (fresh or steamed is best), exp. 2 servings of vegetables with lunch and dinner and 2 servings of fruit per day. Berries and greens such as kale and collards are great choices.  -consume on a regular basis: whole grains (make sure first ingredient on label contains the word "whole"), fresh fruits, fish, nuts, seeds, healthy oils (such as olive oil, avocado oil, grape seed oil) -may eat small amounts of dairy and lean meat on occasion, but avoid processed meats such as ham, bacon, lunch meat, etc. -drink water -try to avoid fast food and pre-packaged foods, processed meat -most experts advise limiting sodium to < 23040mper day, should limit further is any chronic conditions such as high blood pressure, heart disease, diabetes, etc. The American Heart Association advised that < 150058ms is ideal -try to avoid foods that contain any ingredients with names you do not recognize  -try to avoid sugar/sweets (except for the natural sugar that occurs in fresh fruit) -try to avoid sweet  drinks -try to avoid white rice, white bread, pasta (unless whole grain), white or yellow potatoes  MOVE - the key to keeping your body moving and working best: -it is great that you are so active! Keep it up! -if you wish to increase your physical activity, do so gradually and with the approval of your doctor -STOP and seek medical care immediately if you have any chest pain, chest discomfort or trouble breathing when starting or increasing exercise  -move and stretch your body, legs, feet and arms when sitting for long periods -Physical activity guidelines for optimal health in adults: -least 150 minutes per week of aerobic exercise (can talk, but not sing) once approved by your doctor, 20-30 minutes of sustained activity or two 10 minute episodes of sustained activity every day.  -resistance training at least 2 days per week if approved by your doctor -balance exercises 3+ days per week:   Stand somewhere where you have something sturdy to hold onto if you lose balance.    1) lift up on toes, start with 5x per day and work up to 20x   2) stand and lift on leg straight out to the side so that foot is a few inches of the floor, start with 5x each side and work up to 20x each side   3) stand on one foot, start with 5 seconds each side and work up to 20 seconds on each side  If you need ideas or help with getting more active:  -Silver sneakers https://tools.silversneakers.com  -Walk with a Doc: Htthttp://stephens-thompson.biz/try to include resistance (weight lifting/strength building) and balance exercises twice per week: or the following link for ideas: httChessContest.frttUpdateClothing.com.cyTRESS MANAGEMENT - so important for health and well being -try meditating,  or just sitting quietly with deep breathing while intentionally relaxing all parts of your body for 5 minutes  daily             Lucretia Kern, DO

## 2022-10-31 NOTE — Patient Instructions (Addendum)
I really enjoyed getting to talk with you today! I am available on Tuesdays and Thursdays for virtual visits if you have any questions or concerns, or if I can be of any further assistance.   CHECKLIST FROM ANNUAL WELLNESS VISIT:  -Follow up (please call to schedule if not scheduled after visit):  -Inperson visit with your Primary Doctor office: yearly and per recommendations from your doctor -yearly for annual wellness visit with primary care office  Here is a list of your preventive care/health maintenance measures and the plan for each if any are due:  Health Maintenance  Topic Date Due   DTaP/Tdap/Td (1 - Tdap) Deferred per your wishes and prior reaction   INFLUENZA VACCINE  Due annually   Medicare Annual Wellness (AWV)  11/01/2023   Pneumonia Vaccine 37+ Years old  Completed   Hepatitis C Screening  Completed   HPV VACCINES  Aged Out   COLONOSCOPY (Pts 45-28yr Insurance coverage will need to be confirmed)  Discontinued   COVID-19 Vaccine  Discontinued   Zoster Vaccines- Shingrix  Discontinued    -See a dentist at least yearly  -Get your eyes checked and then per your eye specialist's recommendations  -Other issues addressed today:  -I have included below further information regarding a healthy whole foods based diet, physical activity guidelines for adults, stress management and opportunities for social connections. I hope you find this information useful.     FOOD - THE FUEL FOR A HAPPY HEALTHY LIFE: -eat real food: lots of colorful vegetables (half the plate) and fruits -5-7 servings of vegetables and fruits per day (fresh or steamed is best), exp. 2 servings of vegetables with lunch and dinner and 2 servings of fruit per day.  Berries and greens such as kale and collards are great choices.  -consume on a regular basis: whole grains (make sure first ingredient on label contains the word "whole"), fresh fruits, fish, nuts, seeds, healthy oils (such as olive oil, avocado oil, grape seed oil) -may eat small amounts of dairy and lean meat on occasion, but avoid processed meats such as ham, bacon, lunch meat, etc. -drink water -try to avoid fast food and pre-packaged foods, processed meat -most experts advise limiting sodium to < '2300mg'$  per day, should limit further is any chronic conditions such as high blood pressure, heart disease, diabetes, etc. The American Heart Association advised that < '1500mg'$  is is ideal -try to avoid foods that contain any ingredients with names you do not recognize  -try to avoid sugar/sweets (except for the natural sugar that occurs in fresh fruit) -try to avoid sweet drinks -try to avoid white rice, white bread, pasta (unless whole grain), white or yellow potatoes  MOVE - the key to keeping your body moving and working best: -it is great that you are so active! Keep it up! -if you wish to increase your physical activity, do so gradually and with the approval of your doctor -STOP and seek medical care immediately if you have any chest pain, chest discomfort or trouble breathing when starting or increasing exercise  -move and stretch your body, legs, feet and arms when sitting for long periods -Physical activity guidelines for optimal health in adults: -least 150 minutes per week of aerobic exercise (can talk, but not sing) once approved by your doctor, 20-30 minutes of sustained activity or two 10 minute episodes of sustained activity every day.  -resistance training at least 2 days per week if approved by your doctor -balance exercises 3+ days  per week:   Stand somewhere where you have something sturdy to hold onto if you lose balance.    1) lift up on toes, start with 5x per day and work up to  20x   2) stand and lift on leg straight out to the side so that foot is a few inches of the floor, start with 5x each side and work up to 20x each side   3) stand on one foot, start with 5 seconds each side and work up to 20 seconds on each side  If you need ideas or help with getting more active:  -Silver sneakers https://tools.silversneakers.com  -Walk with a Doc: http://stephens-thompson.biz/  -try to include resistance (weight lifting/strength building) and balance exercises twice per week: or the following link for ideas: ChessContest.fr  UpdateClothing.com.cy  STRESS MANAGEMENT - so important for health and well being -try meditating, or just sitting quietly with deep breathing while intentionally relaxing all parts of your body for 5 minutes daily

## 2023-06-27 ENCOUNTER — Other Ambulatory Visit: Payer: Self-pay | Admitting: Family Medicine

## 2023-07-11 ENCOUNTER — Encounter: Payer: Self-pay | Admitting: Family Medicine

## 2023-07-11 ENCOUNTER — Ambulatory Visit (INDEPENDENT_AMBULATORY_CARE_PROVIDER_SITE_OTHER): Payer: Medicare Other | Admitting: Family Medicine

## 2023-07-11 VITALS — BP 128/78 | HR 68 | Temp 98.0°F | Ht 72.0 in | Wt 224.0 lb

## 2023-07-11 DIAGNOSIS — I10 Essential (primary) hypertension: Secondary | ICD-10-CM | POA: Diagnosis not present

## 2023-07-11 DIAGNOSIS — E785 Hyperlipidemia, unspecified: Secondary | ICD-10-CM | POA: Diagnosis not present

## 2023-07-11 LAB — LIPID PANEL
Cholesterol: 155 mg/dL (ref 0–200)
HDL: 53.2 mg/dL (ref >39.00)
LDL Cholesterol: 86 mg/dL (ref 0–99)
NonHDL: 101.63
Total CHOL/HDL Ratio: 3
Triglycerides: 77 mg/dL (ref 0.0–149.0)
VLDL: 15.4 mg/dL (ref 0.0–40.0)

## 2023-07-11 LAB — COMPREHENSIVE METABOLIC PANEL
ALT: 26 U/L (ref 0–53)
AST: 22 U/L (ref 0–37)
Albumin: 4.8 g/dL (ref 3.5–5.2)
Alkaline Phosphatase: 52 U/L (ref 39–117)
BUN: 23 mg/dL (ref 6–23)
CO2: 28 mEq/L (ref 19–32)
Calcium: 9.9 mg/dL (ref 8.4–10.5)
Chloride: 100 mEq/L (ref 96–112)
Creatinine, Ser: 1.11 mg/dL (ref 0.40–1.50)
GFR: 62.92 mL/min (ref >60.00)
Glucose, Bld: 107 mg/dL — ABNORMAL HIGH (ref 70–99)
Potassium: 4.4 mEq/L (ref 3.5–5.1)
Sodium: 137 mEq/L (ref 135–145)
Total Bilirubin: 0.7 mg/dL (ref 0.2–1.2)
Total Protein: 7 g/dL (ref 6.0–8.3)

## 2023-07-11 LAB — CBC
HCT: 45.7 % (ref 39.0–52.0)
Hemoglobin: 15.1 g/dL (ref 13.0–17.0)
MCHC: 33 g/dL (ref 30.0–36.0)
MCV: 86.4 fl (ref 78.0–100.0)
Platelets: 191 10*3/uL (ref 150.0–400.0)
RBC: 5.29 Mil/uL (ref 4.22–5.81)
RDW: 13.9 % (ref 11.5–15.5)
WBC: 5.3 10*3/uL (ref 4.0–10.5)

## 2023-07-11 MED ORDER — OLMESARTAN MEDOXOMIL-HCTZ 40-25 MG PO TABS: 1.0000 | ORAL_TABLET | Freq: Every evening | ORAL | 2 refills | Status: DC

## 2023-07-11 NOTE — Progress Notes (Signed)
Chief Complaint  Patient presents with   Follow-up    Subjective Bryan Rivers is a 82 y.o. male who presents for hypertension follow up. He does monitor home blood pressures. Blood pressures ranging from 120's/80's on average. He is compliant with medications- Benicar-hydrochlorothiazide 40-25 mg/d. Patient has these side effects of medication: none He is adhering to a healthy diet overall. Current exercise: golfing,  lifting wts, some cardio No CP or SOB.   Hyperlipidemia Patient presents for dyslipidemia follow up. Currently being treated with Crestor 40 mg/d and compliance with treatment thus far has been good. He denies myalgias. Diet/exercise as above.  The patient is not known to have coexisting coronary artery disease.   Past Medical History:  Diagnosis Date   Aneurysm of left popliteal artery ([facility]) 01/31/2019   thrombosed left popliteal artery aneurysm   Arthritis    ED (erectile dysfunction)    Hyperlipemia    Hypertension    Morbid obesity ([facility]) 02/11/2013   PAD (peripheral artery disease) ([facility])    Prostate cancer ([facility])     Exam BP 128/78 (BP Location: Left Arm, Patient Position: Sitting, Cuff Size: Large)   Pulse 68   Temp 98 F (36.7 C) (Oral)   Ht 6' (1.829 m)   Wt 224 lb (101.6 kg)   SpO2 93%   BMI 30.38 kg/m  General:  well developed, well nourished, in no apparent distress Heart: RRR, no bruits, no LE edema Lungs: clear to auscultation, no accessory muscle use Psych: well oriented with normal range of affect and appropriate judgment/insight  Essential hypertension - Plan: olmesartan-hydrochlorothiazide (BENICAR HCT) 40-25 MG tablet  Hyperlipidemia, unspecified hyperlipidemia type  Chronic, stable. Cont Benicar-HCT 40-25 mg/d. Counseled on diet and exercise. Chronic, stable. Cont Crestor 40 mg/d.  Tdap rec'd to get at the pharmacy.  F/u in 6 mo. The patient voiced understanding and agreement to the plan.  [name] [place], DO 07/11/23   10:53 AM

## 2023-07-11 NOTE — Patient Instructions (Addendum)
Give [place] 2-3 business days to get the results of your labs back.   Keep the diet clean and stay active.  I recommend getting the flu shot in mid October. This suggestion would change if the CDC comes out with a different recommendation.   Please consider getting your tetanus booster vaccine at the pharmacy.   Let us know if you need anything.

## 2023-08-31 ENCOUNTER — Other Ambulatory Visit: Payer: Self-pay

## 2023-08-31 ENCOUNTER — Encounter (HOSPITAL_BASED_OUTPATIENT_CLINIC_OR_DEPARTMENT_OTHER): Payer: Self-pay | Admitting: Pediatrics

## 2023-08-31 ENCOUNTER — Emergency Department (HOSPITAL_BASED_OUTPATIENT_CLINIC_OR_DEPARTMENT_OTHER)
Admission: EM | Admit: 2023-08-31 | Discharge: 2023-08-31 | Disposition: A | Discharge status: Home / Self Care | Payer: Medicare Other | Attending: Emergency Medicine | Admitting: Emergency Medicine

## 2023-08-31 DIAGNOSIS — Z7982 Long term (current) use of aspirin: Secondary | ICD-10-CM | POA: Diagnosis not present

## 2023-08-31 DIAGNOSIS — Z79899 Other long term (current) drug therapy: Secondary | ICD-10-CM | POA: Diagnosis not present

## 2023-08-31 DIAGNOSIS — I10 Essential (primary) hypertension: Secondary | ICD-10-CM | POA: Insufficient documentation

## 2023-08-31 DIAGNOSIS — R059 Cough, unspecified: Secondary | ICD-10-CM | POA: Diagnosis present

## 2023-08-31 DIAGNOSIS — U071 COVID-19: Secondary | ICD-10-CM | POA: Insufficient documentation

## 2023-08-31 LAB — SARS CORONAVIRUS 2 BY RT PCR: SARS Coronavirus 2 by RT PCR: POSITIVE — AB

## 2023-08-31 NOTE — ED Triage Notes (Signed)
Reports cough and weakness started yesterday and used a home test which is +; stated he is here today to make sure.

## 2023-08-31 NOTE — ED Notes (Signed)
Discharge paperwork reviewed entirely with patient, including follow up care. Pain was under control. No prescriptions were called in, but all questions were addressed.  Pt verbalized understanding as well as all parties involved. No questions or concerns voiced at the time of discharge. No acute distress noted.   Pt ambulated out to PVA without incident or assistance.  

## 2023-08-31 NOTE — ED Notes (Signed)
Pt advised yesterday he had the sweats. Took two COVID home PCR tests, and they were both positive. Had on in his pocket as proof. No acute distress.

## 2023-08-31 NOTE — ED Provider Notes (Signed)
[place] [facility] Provider Note   CSN: [id number] Arrival date & time: 08/31/23  1348    History  Chief Complaint  Patient presents with   Cough    Bryan Rivers is a 82 y.o. male history of hypertension, PAD here for evaluation of cough.  States yesterday he developed a nonproductive cough.  States he took 2 naps throughout the day which he does not typically take.  States he woke up this morning and felt significantly improved.  He was concerned as he took multiple home COVID test which were positive.  He returns here for confirmation.  He has associated clear rhinorrhea.  No fever, headache, nausea, vomiting, chest pain, shortness of breath, abdominal pain, dysuria, weakness.  Patient states he was concerned as he lives with elderly family member to make sure that his home test for correct. States he feels "fine" today after his nap yesterday.  HPI     Home Medications Prior to Admission medications   Medication Sig Start Date End Date Taking? Authorizing Provider  aspirin EC 81 MG tablet Take by mouth daily. 04/13/22   [provider]  olmesartan-hydrochlorothiazide (BENICAR HCT) 40-25 MG tablet Take 1 tablet by mouth every evening. 07/11/23   Sharlene Dory, DO  rosuvastatin (CRESTOR) 40 MG tablet TAKE ONE TABLET BY MOUTH ONE TIME DAILY 06/28/23   Sharlene Dory, DO      Allergies    Patient has no known allergies.    Review of Systems   Review of Systems  Constitutional: Negative.   HENT:  Positive for rhinorrhea. Negative for congestion, dental problem, drooling, ear discharge, ear pain, facial swelling, hearing loss, mouth sores, nosebleeds, postnasal drip, sinus pressure, sinus pain, sneezing, sore throat, tinnitus, trouble swallowing and voice change.   Respiratory:  Positive for cough. Negative for apnea, choking, chest tightness, shortness of breath, wheezing and stridor.   Cardiovascular: Negative.    Gastrointestinal: Negative.   Genitourinary: Negative.   Musculoskeletal: Negative.   Skin: Negative.   Neurological: Negative.   All other systems reviewed and are negative.   Physical Exam Updated Vital Signs BP (!) 162/78 (BP Location: Left Arm)   Pulse 72   Temp 98.6 F (37 C)   Resp 20   Ht 6' (1.829 m)   Wt 99.8 kg   SpO2 96%   BMI 29.84 kg/m  Physical Exam Vitals and nursing note reviewed.  Constitutional:      General: He is not in acute distress.    Appearance: He is well-developed. He is not ill-appearing, toxic-appearing or diaphoretic.  HENT:     Head: Normocephalic and atraumatic.     Nose: Nose normal. No congestion or rhinorrhea.     Mouth/Throat:     Mouth: Mucous membranes are moist.     Pharynx: No oropharyngeal exudate or posterior oropharyngeal erythema.  Eyes:     Pupils: Pupils are equal, round, and reactive to light.  Cardiovascular:     Rate and Rhythm: Normal rate and regular rhythm.     Pulses: Normal pulses.     Heart sounds: Normal heart sounds.  Pulmonary:     Effort: Pulmonary effort is normal. No respiratory distress.     Breath sounds: Normal breath sounds.  Abdominal:     General: Bowel sounds are normal. There is distension.     Palpations: Abdomen is soft.     Tenderness: There is no abdominal tenderness. There is no right CVA tenderness or  guarding.  Musculoskeletal:        General: Normal range of motion.     Cervical back: Normal range of motion and neck supple.  Skin:    General: Skin is warm and dry.     Capillary Refill: Capillary refill takes less than 2 seconds.  Neurological:     General: No focal deficit present.     Mental Status: He is alert and oriented to person, place, and time.     ED Results / Procedures / Treatments   Labs (all labs ordered are listed, but only abnormal results are displayed) Labs Reviewed  SARS CORONAVIRUS 2 BY RT PCR - Abnormal; Notable for the following components:      Result Value    SARS Coronavirus 2 by RT PCR POSITIVE (*)    All other components within normal limits    EKG None  Radiology No results found.  Procedures Procedures    Medications Ordered in ED Medications - No data to display  ED Course/ Medical Decision Making/ A&P    82 year old here for evaluation of cough and rhinorrhea.  Began yesterday.  States he took a nap yesterday which was unusual for himself.  He felt significantly proved when he woke up today however he was concerned as he took multiple home COVID test which were positive.  He comes in today for confirmation testing.  No chest pain, shortness of breath, vomiting, abdominal pain, weakness.  He is afebrile, nonseptic, not ill-appearing.  His heart and lungs are clear.  His abdomen is soft, nontender.  Posterior pharynx clear.  No congestion or rhinorrhea.  He states he currently feels "fine" and just came for confirmation of his COVID test as he lives with an elderly family member.   Labs personally viewed and interpreted:  COVID-positive  Discussed results with patient.  We discussed treatment with antiviral Paxlovid however patient declines as he states he is currently asymptomatic.  I feel this is reasonable.  I encouraged him to close watch of his symptoms, return for new or worsening symptoms.  Do not feel he needs additional labs or imaging at this time.  The patient has been appropriately medically screened and/or stabilized in the ED. I have low suspicion for any other emergent medical condition which would require further screening, evaluation or treatment in the ED or require inpatient management.  Patient is hemodynamically stable and in no acute distress.  Patient able to ambulate in department prior to ED.  Evaluation does not show acute pathology that would require ongoing or additional emergent interventions while in the emergency department or further inpatient treatment.  I have discussed the diagnosis with the patient  and answered all questions.  Pain is been managed while in the emergency department and patient has no further complaints prior to discharge.  Patient is comfortable with plan discussed in room and is stable for discharge at this time.  I have discussed strict return precautions for returning to the emergency department.  Patient was encouraged to follow-up with PCP/specialist refer to at discharge.                                 Medical Decision Making Amount and/or Complexity of Data Reviewed External Data Reviewed: labs and notes. Labs: ordered. Decision-making details documented in ED Course.  Risk OTC drugs. Decision regarding hospitalization. Diagnosis or treatment significantly limited by social determinants of health.  Final Clinical Impression(s) / ED Diagnoses Final diagnoses:  COVID    Rx / DC Orders ED Discharge Orders     None         Ledonna Dormer A, PA-C 08/31/23 1502    Benjiman Core, MD 09/03/23 1620

## 2023-08-31 NOTE — Discharge Instructions (Addendum)
It was a pleasure taking care of you today.  You COVID test was positive  Make sure to keep watching your symptoms.  If you develop chest pain, shortness of breath, persistent nausea vomiting please seek reevaluation otherwise hearing.  You may get over-the-counter Flonase, 2 squirts in each nostril twice daily.  Return for new or worsening symptoms

## 2023-09-04 ENCOUNTER — Encounter: Payer: Self-pay | Admitting: Family Medicine

## 2023-09-04 ENCOUNTER — Ambulatory Visit (INDEPENDENT_AMBULATORY_CARE_PROVIDER_SITE_OTHER): Payer: Medicare Other | Admitting: Family Medicine

## 2023-09-04 VITALS — BP 138/76 | HR 61 | Temp 98.3°F | Ht 72.0 in | Wt 225.2 lb

## 2023-09-04 DIAGNOSIS — U071 COVID-19: Secondary | ICD-10-CM | POA: Diagnosis not present

## 2023-09-04 MED ORDER — PROMETHAZINE-DM 6.25-15 MG/5ML PO SYRP: 5.0000 mL | ORAL_SOLUTION | Freq: Four times a day (QID) | ORAL | 0 refills | Status: DC | PRN

## 2023-09-04 NOTE — Progress Notes (Signed)
Chief Complaint  Patient presents with   ER    Had Covid    Bryan Rivers here for URI complaints.  Duration: 5 days  Associated symptoms: sinus congestion, rhinorrhea, ear fullness, and coughing Denies: sinus pain, itchy watery eyes, ear pain, ear drainage, sore throat, wheezing, shortness of breath, myalgia, and fevers Treatment to date: Tylenol Sick contacts: No Tested + for covid last week.   Past Medical History:  Diagnosis Date   Aneurysm of left popliteal artery (HCC) 01/31/2019   thrombosed left popliteal artery aneurysm   Arthritis    ED (erectile dysfunction)    Hyperlipemia    Hypertension    Morbid obesity (HCC) 02/11/2013   PAD (peripheral artery disease) (HCC)    Prostate cancer (HCC)     Objective BP 138/76 (BP Location: Left Arm, Patient Position: Sitting, Cuff Size: Large)   Pulse 61   Temp 98.3 F (36.8 C) (Oral)   Ht 6' (1.829 m)   Wt 225 lb 4 oz (102.2 kg)   SpO2 98%   BMI 30.55 kg/m  General: Awake, alert, appears stated age HEENT: AT, [place], ears patent b/l and TM's neg, nares patent w/o discharge, pharynx pink and without exudates, MMM Neck: No masses or asymmetry Heart: RRR Lungs: CTAB, no accessory muscle use Psych: Age appropriate judgment and insight, normal mood and affect  COVID-19 - Plan: promethazine-dextromethorphan (PROMETHAZINE-DM) 6.25-15 MG/5ML syrup  Seems to be improving. Discussed warnings for syrup and also CDC quarantining guidelines. Continue to push fluids, practice good hand hygiene, cover mouth when coughing. F/u prn. If starting to experience fevers, shaking, or shortness of breath, seek immediate care. Pt voiced understanding and agreement to the plan.  [name], DO 09/04/23 3:15 PM

## 2023-09-04 NOTE — Patient Instructions (Addendum)
Do your best to mask and social distance until Saturday morning. This is following CDC quarantining guidelines.  OK to take Tylenol 1000 mg (2 extra strength tabs) or 975 mg (3 regular strength tabs) every 6 hours as needed.  Continue to push fluids, practice good hand hygiene, and cover your mouth if you cough.  If you start having fevers, shaking or shortness of breath, seek immediate care.  The cough syrup can make you drowsy. Don't do anything that requires alertness while on it.   Let us know if you need anything.

## 2023-11-01 ENCOUNTER — Telehealth: Payer: Self-pay | Admitting: Family Medicine

## 2023-11-01 NOTE — Telephone Encounter (Signed)
Copied from CRM [id number]. Topic: Medicare AWV >> Nov 01, 2023  2:46 PM Payton Doughty wrote: Reason for CRM: Called LVM 11/01/2023 to schedule Annual Wellness Visit  Verlee Rossetti; Care Guide Ambulatory Clinical Support Tok l [facility] Direct Dial: [phone]

## 2024-03-31 ENCOUNTER — Other Ambulatory Visit: Payer: Self-pay | Admitting: Family Medicine

## 2024-04-08 ENCOUNTER — Other Ambulatory Visit: Payer: Self-pay

## 2024-04-08 ENCOUNTER — Encounter: Payer: Self-pay | Admitting: Family Medicine

## 2024-04-08 DIAGNOSIS — I10 Essential (primary) hypertension: Secondary | ICD-10-CM

## 2024-04-08 MED ORDER — OLMESARTAN MEDOXOMIL-HCTZ 40-25 MG PO TABS: 1.0000 | ORAL_TABLET | Freq: Every evening | ORAL | 2 refills | Status: DC

## 2024-04-08 MED ORDER — ROSUVASTATIN CALCIUM 40 MG PO TABS: 40.0000 mg | ORAL_TABLET | Freq: Every day | ORAL | 2 refills | Status: AC

## 2024-05-02 ENCOUNTER — Ambulatory Visit (INDEPENDENT_AMBULATORY_CARE_PROVIDER_SITE_OTHER): Admitting: Family Medicine

## 2024-05-02 ENCOUNTER — Encounter: Payer: Self-pay | Admitting: Family Medicine

## 2024-05-02 VITALS — BP 132/74 | HR 72 | Temp 98.0°F | Resp 16 | Ht 73.0 in | Wt 225.0 lb

## 2024-05-02 DIAGNOSIS — M654 Radial styloid tenosynovitis [de Quervain]: Secondary | ICD-10-CM

## 2024-05-02 MED ORDER — PREDNISONE 20 MG PO TABS: 40.0000 mg | ORAL_TABLET | Freq: Every day | ORAL | 0 refills | Status: AC

## 2024-05-02 NOTE — Patient Instructions (Addendum)
 Consider a thumb spica splint.   Heat (pad or rice pillow in microwave) over affected area, 10-15 minutes twice daily.   Ice/cold pack over area for 10-15 min twice daily.  OK to take Tylenol  1000 mg (2 extra strength tabs) or 975 mg (3 regular strength tabs) every 6 hours as needed.  Cancel your follow up with us  if you are doing quite a bit better.   Let us  know if you need anything.  Wrist and Forearm Exercises Do exercises exactly as told by your health care provider and adjust them as directed. It is normal to feel mild stretching, pulling, tightness, or discomfort as you do these exercises, but you should stop right away if you feel sudden pain or your pain gets worse.   RANGE OF MOTION EXERCISES These exercises warm up your muscles and joints and improve the movement and flexibility of your injured wrist and forearm. These exercises also help to relieve pain, numbness, and tingling. These exercises are done using the muscles in your injured wrist and forearm. Exercise A: Wrist Flexion, Active With your fingers relaxed, bend your wrist forward as far as you can. Hold this position for 30 seconds. Repeat 2 times. Complete this exercise 3 times per week. Exercise B: Wrist Extension, Active With your fingers relaxed, bend your wrist backward as far as you can. Hold this position for 30 seconds. Repeat 2 times. Complete this exercise 3 times per week. Exercise C: Supination, Active  Stand or sit with your arms at your sides. Bend your left / right elbow to an "L" shape (90 degrees). Turn your palm upward until you feel a gentle stretch on the inside of your forearm. Hold this position for 30 seconds. Slowly return your palm to the starting position. Repeat 2 times. Complete this exercise 3 times per week. Exercise D: Pronation, Active  Stand or sit with your arms at your sides. Bend your left / right elbow to an "L" shape (90 degrees). Turn your palm downward until you feel a  gentle stretch on the top of your forearm. Hold this position for 30 seconds. Slowly return your palm to the starting position. Repeat 2 times. Complete this exercise once a day.  STRETCHING EXERCISES These exercises warm up your muscles and joints and improve the movement and flexibility of your injured wrist and forearm. These exercises also help to relieve pain, numbness, and tingling. These exercises are done using your healthy wrist and forearm to help stretch the muscles in your injured wrist and forearm. Exercise E: Wrist Flexion, Passive  Extend your left / right arm in front of you, relax your wrist, and point your fingers downward. Gently push on the back of your hand. Stop when you feel a gentle stretch on the top of your forearm. Hold this position for 30 seconds. Repeat 2 times. Complete this exercise 3 times per week. Exercise F: Wrist Extension, Passive  Extend your left / right arm in front of you and turn your palm upward. Gently pull your palm and fingertips back so your fingers point downward. You should feel a gentle stretch on the palm-side of your forearm. Hold this position for 30 seconds. Repeat 2 times. Complete this exercise 3 times per week. Exercise G: Forearm Rotation, Supination, Passive Sit with your left / right elbow bent to an "L" shape (90 degrees) with your forearm resting on a table. Keeping your upper body and shoulder still, use your other hand to rotate your forearm palm-up until  you feel a gentle to moderate stretch. Hold this position for 30 seconds. Slowly release the stretch and return to the starting position. Repeat 2 times. Complete this exercise 3 times per week. Exercise H: Forearm Rotation, Pronation, Passive Sit with your left / right elbow bent to an "L" shape (90 degrees) with your forearm resting on a table. Keeping your upper body and shoulder still, use your other hand to rotate your forearm palm-down until you feel a gentle to  moderate stretch. Hold this position for 30 seconds. Slowly release the stretch and return to the starting position. Repeat 2 times. Complete this exercise 3 times per week.  STRENGTHENING EXERCISES These exercises build strength and endurance in your wrist and forearm. Endurance is the ability to use your muscles for a long time, even after they get tired. Exercise I: Wrist Flexors  Sit with your left / right forearm supported on a table and your hand resting palm-up over the edge of the table. Your elbow should be bent to an "L" shape (about 90 degrees) and be below the level of your shoulder. Hold a 3-5 lb weight in your left / right hand. Or, hold a rubber exercise band or tube in both hands, keeping your hands at the same level and hip distance apart. There should be a slight tension in the exercise band or tube. Slowly curl your hand up toward your forearm. Hold this position for 3 seconds. Slowly lower your hand back to the starting position. Repeat 2 times. Complete this exercise 3 times per week. Exercise J: Wrist Extensors  Sit with your left / right forearm supported on a table and your hand resting palm-down over the edge of the table. Your elbow should be bent to an "L" shape (about 90 degrees) and be below the level of your shoulder. Hold a 3-5 lb weight in your left / right hand. Or, hold a rubber exercise band or tube in both hands, keeping your hands at the same level and hip distance apart. There should be a slight tension in the exercise band or tube. Slowly curl your hand up toward your forearm. Hold this position for 3 seconds. Slowly lower your hand back to the starting position. Repeat 2 times. Complete this exercise 3 times per week. Exercise K: Forearm Rotation, Supination  Sit with your left / right forearm supported on a table and your hand resting palm-down. Your elbow should be at your side, bent to an "L" shape (about 90 degrees), and below the level of your  shoulder. Keep your wrist stable and in a neutral position throughout the exercise. Gently hold a lightweight hammer with your left / right hand. Without moving your elbow or wrist, slowly rotate your palm upward to a thumbs-up position. Hold this position for 3 seconds. Slowly return your forearm to the starting position. Repeat 2 times. Complete this exercise 3 times per week. Exercise L: Forearm Rotation, Pronation  Sit with your left / right forearm supported on a table and your hand resting palm-up. Your elbow should be at your side, bent to an "L" shape (about 90 degrees), and below the level of your shoulder. Keep your wrist stable. Do not allow it to move backward or forward during the exercise. Gently hold a lightweight hammer with your left / right hand. Without moving your elbow or wrist, slowly rotate your palm and hand upward to a thumbs-up position. Hold this position for 3 seconds. Slowly return your forearm to the starting  position. Repeat 2 times. Complete this exercise 3 times per week. Exercise M: Grip Strengthening  Hold one of these items in your left / right hand: play dough, therapy putty, a dense sponge, a stress ball, or a large, rolled sock. Squeeze as hard as you can without increasing pain. Hold this position for 5 seconds. Slowly release your grip. Repeat 2 times. Complete this exercise 3 times per week.  This information is not intended to replace advice given to you by your health care provider. Make sure you discuss any questions you have with your health care provider. Document Released: 09/27/2005 Document Revised: 08/07/2016 Document Reviewed: 08/08/2015 Elsevier Interactive Patient Education  [employer].

## 2024-05-02 NOTE — Progress Notes (Signed)
 Musculoskeletal Exam  Patient: Bryan Rivers DOB: 02-26-1941  DOS: 05/02/2024  SUBJECTIVE:  Chief Complaint:   Chief Complaint  Patient presents with   Joint Swelling    R Wrist Swollen     Bryan Rivers is a 83 y.o.  male for evaluation and treatment of R wirst pain.   Onset:  1 year ago. No inj or change in activity.  Location: top (dorsum) of R wrist and base of thumb Character:  sharp  Progression of issue:  has worsened Associated symptoms: swelling No bruising, redness, catching/locking.  Treatment: to date has been OTC NSAIDS, acetaminophen , and wearing a neoprene wrist brace.   Neurovascular symptoms: no  Past Medical History:  Diagnosis Date   Aneurysm of left popliteal artery (HCC) 01/31/2019   thrombosed left popliteal artery aneurysm   Arthritis    ED (erectile dysfunction)    Hyperlipemia    Hypertension    Morbid obesity (HCC) 02/11/2013   PAD (peripheral artery disease) (HCC)    Prostate cancer (HCC)     Objective: VITAL SIGNS: BP 132/74 (BP Location: Left Arm, Patient Position: Sitting)   Pulse 72   Temp 98 F (36.7 C) (Oral)   Resp 16   Ht 6\' 1"  (1.854 m)   Wt 225 lb (102.1 kg)   SpO2 96%   BMI 29.69 kg/m  Constitutional: Well formed, well developed. No acute distress. Thorax & Lungs: No accessory muscle use Musculoskeletal: R wrist.   Normal active range of motion: yes.   Normal passive range of motion: yes Tenderness to palpation: TTP over extensor pollicis Deformity: no Ecchymosis: no Tests positive: Finkelstein's Tests negative: none Neurologic: Normal sensory function.  Psychiatric: Normal mood. Age appropriate judgment and insight. Alert & oriented x 3.    Assessment:  De Quervain's tenosynovitis, right  Plan: Thumb spica splint. 5 d pred burst 40 mg/d. Stretches/exercises, heat, ice, Tylenol .  F/u in 1 mo. The patient voiced understanding and agreement to the plan.   Bryan Rivers [place], DO 05/02/24  4:02 PM

## 2024-06-09 ENCOUNTER — Encounter: Payer: Self-pay | Admitting: Family Medicine

## 2024-06-10 ENCOUNTER — Encounter: Payer: Self-pay | Admitting: Family Medicine

## 2024-06-10 ENCOUNTER — Ambulatory Visit: Payer: Self-pay | Admitting: Family Medicine

## 2024-06-10 ENCOUNTER — Ambulatory Visit (INDEPENDENT_AMBULATORY_CARE_PROVIDER_SITE_OTHER): Admitting: Family Medicine

## 2024-06-10 VITALS — BP 130/82 | HR 83 | Temp 98.0°F | Resp 16 | Ht 73.0 in | Wt 222.0 lb

## 2024-06-10 DIAGNOSIS — I10 Essential (primary) hypertension: Secondary | ICD-10-CM | POA: Diagnosis not present

## 2024-06-10 DIAGNOSIS — I700 Atherosclerosis of aorta: Secondary | ICD-10-CM | POA: Diagnosis not present

## 2024-06-10 LAB — LIPID PANEL
Cholesterol: 149 mg/dL (ref 0–200)
HDL: 46.1 mg/dL (ref >39.00)
LDL Cholesterol: 76 mg/dL (ref 0–99)
NonHDL: 103
Total CHOL/HDL Ratio: 3
Triglycerides: 137 mg/dL (ref 0.0–149.0)
VLDL: 27.4 mg/dL (ref 0.0–40.0)

## 2024-06-10 LAB — COMPREHENSIVE METABOLIC PANEL WITH GFR
ALT: 32 U/L (ref 0–53)
AST: 27 U/L (ref 0–37)
Albumin: 4.7 g/dL (ref 3.5–5.2)
Alkaline Phosphatase: 57 U/L (ref 39–117)
BUN: 26 mg/dL — ABNORMAL HIGH (ref 6–23)
CO2: 30 meq/L (ref 19–32)
Calcium: 9.7 mg/dL (ref 8.4–10.5)
Chloride: 99 meq/L (ref 96–112)
Creatinine, Ser: 1.16 mg/dL (ref 0.40–1.50)
GFR: 59.29 mL/min — ABNORMAL LOW (ref >60.00)
Glucose, Bld: 101 mg/dL — ABNORMAL HIGH (ref 70–99)
Potassium: 4.6 meq/L (ref 3.5–5.1)
Sodium: 137 meq/L (ref 135–145)
Total Bilirubin: 0.6 mg/dL (ref 0.2–1.2)
Total Protein: 7 g/dL (ref 6.0–8.3)

## 2024-06-10 LAB — CBC
HCT: 47.5 % (ref 39.0–52.0)
Hemoglobin: 15.9 g/dL (ref 13.0–17.0)
MCHC: 33.5 g/dL (ref 30.0–36.0)
MCV: 85.6 fl (ref 78.0–100.0)
Platelets: 168 K/uL (ref 150.0–400.0)
RBC: 5.55 Mil/uL (ref 4.22–5.81)
RDW: 13.9 % (ref 11.5–15.5)
WBC: 5.3 K/uL (ref 4.0–10.5)

## 2024-06-10 NOTE — Progress Notes (Signed)
 Chief Complaint  Patient presents with   Annual Exam    CPE    Subjective Bryan Rivers is a 83 y.o. male who presents for hypertension follow up. He does not monitor home blood pressures. He is compliant with medications- olmesartan -hydrochlorothiazide  40-25 mg/d. Patient has these side effects of medication: none He is usually adhering to a healthy diet overall. Current exercise: lifting wts, cardio, golfing No CP or SOB.   Hyperlipidemia Patient presents for hyperlipidemia follow up. Currently being treated with Crestor  40 mg/d and compliance with treatment thus far has been good. He denies myalgias. Diet/exercise as above. The patient is not known to have coexisting coronary artery disease.  CT shoulder in 2022 showing scattered LN's. He has never felt any lumps, we did not see these results and have not palpated yet.    Past Medical History:  Diagnosis Date   Aneurysm of left popliteal artery (HCC) 01/31/2019   thrombosed left popliteal artery aneurysm   Arthritis    ED (erectile dysfunction)    Hyperlipemia    Hypertension    Morbid obesity (HCC) 02/11/2013   PAD (peripheral artery disease) (HCC)    Prostate cancer (HCC)     Exam BP 130/82 (BP Location: Left Arm, Patient Position: Sitting)   Pulse 83   Temp 98 F (36.7 C) (Oral)   Resp 16   Ht 6' 1 (1.854 m)   Wt 222 lb (100.7 kg)   SpO2 96%   BMI 29.29 kg/m  General:  well developed, well nourished, in no apparent distress Heart: RRR, no bruits, no LE edema Lungs: clear to auscultation, no accessory muscle use Lymph: no gross abn. No palpable LN's.  Psych: well oriented with normal range of affect and appropriate judgment/insight  Essential hypertension - Plan: CBC, Comprehensive metabolic panel with GFR, Lipid panel  Aortic atherosclerosis (HCC)  Chronic, stable. Cont olmesartan - hydrochlorothiazide  40-25 mg/d. Counseled on diet and exercise. Chronic, stable. Cont Crestor  40 mg/d.  Reassurance given  for abn LN findings from 2022.  F/u in 6 mo. AWV at convenience.  The patient voiced understanding and agreement to the plan.  Mabel [place], DO 06/10/24  12:48 PM

## 2024-06-10 NOTE — Patient Instructions (Addendum)
 Give us  2-3 business days to get the results of your labs back.   Keep the diet clean and stay active.  I do not see or feel any evidence of lymph nodes in the chest/arm pit region.   Let us  know if you need anything.

## 2024-06-17 ENCOUNTER — Other Ambulatory Visit: Payer: Self-pay | Admitting: Family Medicine

## 2024-06-17 ENCOUNTER — Encounter: Payer: Self-pay | Admitting: Family Medicine

## 2024-06-17 MED ORDER — AZITHROMYCIN 250 MG PO TABS: ORAL_TABLET | ORAL | 0 refills | Status: DC

## 2024-06-24 ENCOUNTER — Encounter: Payer: Self-pay | Admitting: Family Medicine

## 2024-06-25 ENCOUNTER — Other Ambulatory Visit: Payer: Self-pay

## 2024-06-25 DIAGNOSIS — R059 Cough, unspecified: Secondary | ICD-10-CM

## 2024-07-03 ENCOUNTER — Other Ambulatory Visit: Payer: Self-pay

## 2024-07-03 ENCOUNTER — Ambulatory Visit: Admitting: Orthopedic Surgery

## 2024-07-03 DIAGNOSIS — M25512 Pain in left shoulder: Secondary | ICD-10-CM

## 2024-07-04 ENCOUNTER — Encounter: Payer: Self-pay | Admitting: Orthopedic Surgery

## 2024-07-04 NOTE — Progress Notes (Signed)
 Office Visit Note   Patient: Bryan Rivers           Date of Birth: 10-11-1941           MRN: [id number] Visit Date: 07/03/2024 Requested by: Frann Mabel Mt, DO [address] [place],  [place] [address] PCP: Frann Mabel Mt, DO  Subjective: Chief Complaint  Patient presents with   Left Shoulder - Pain    HPI: Bryan Rivers is a 83 y.o. male who presents to the office reporting mild left shoulder symptoms.  Patient really wants to see if his left shoulder is worse radiographically.  Than it was before.  States that the pain comes and goes.  No pain today.  He has been going to the gym 3 times a week doing primarily low weight resistance stretching and flexibility exercises.  Plays golf 3 times a week.  Usually worse with damp weather.  Occasionally wakes him from sleep but he does not take any medication.  He is able to sleep on the left-hand side.                ROS: All systems reviewed are negative as they relate to the chief complaint within the history of present illness.  Patient denies fevers or chills.  Assessment & Plan: Visit Diagnoses:  1. Left shoulder pain, unspecified chronicity     Plan: Impression is radiographically significant arthritis in that left shoulder which has not changed much in 3 years.  Overall he is doing very well.  No indication for intervention at this time.  If his shoulder symptoms worsen he may want to consider an injection for pain relief.  I think his stretching regimen the way he is working out in the gym is a good 1.  He will follow-up with us  as needed.  Follow-Up Instructions: No follow-ups on file.   Orders:  Orders Placed This Encounter  Procedures   XR Shoulder Left   No orders of the defined types were placed in this encounter.     Procedures: No procedures performed   Clinical Data: No additional findings.  Objective: Vital Signs: There were no vitals taken for this visit.  Physical Exam:   Constitutional: Patient appears well-developed HEENT:  Head: Normocephalic Eyes:EOM are normal Neck: Normal range of motion Cardiovascular: Normal rate Pulmonary/chest: Effort normal Neurologic: Patient is alert Skin: Skin is warm Psychiatric: Patient has normal mood and affect  Ortho Exam: Ortho exam demonstrates range of motion on the left at 15/80/95.  External rotation strength is 4+ out of 5 subscap strength 5 out of 5.  Deltoid is functional.  Some hoarseness is present with range of motion.  Specialty Comments:  No specialty comments available.  Imaging: No results found.   PMFS History: Patient Active Problem List   Diagnosis Date Noted   S/P total right hip arthroplasty 05/23/2022   Aortic atherosclerosis (HCC) 03/08/2021   S/P left THA, AA 10/02/2019   Status post total hip replacement, left 10/02/2019   PAD (peripheral artery disease) (HCC) 02/21/2019   Arthritis of left hip 01/23/2019   Chronic left shoulder pain 12/26/2018   Chronic pain of left knee 12/26/2018   Lower back injury, initial encounter 01/02/2017   Left wrist injury 11/09/2015   Left rib fracture 11/09/2015   Hyperglycemia 11/04/2012   Essential hypertension 09/05/2011   Hyperlipidemia 09/05/2011   Past Medical History:  Diagnosis Date   Aneurysm of left popliteal artery (HCC) 01/31/2019   thrombosed  left popliteal artery aneurysm   Arthritis    ED (erectile dysfunction)    Hyperlipemia    Hypertension    Morbid obesity (HCC) 02/11/2013   PAD (peripheral artery disease) (HCC)    Prostate cancer (HCC)     Family History  Problem Relation Age of Onset   Heart disease Mother    Diabetes Mother     Past Surgical History:  Procedure Laterality Date   ABDOMINAL AORTOGRAM W/LOWER EXTREMITY Bilateral 01/31/2019   Procedure: ABDOMINAL AORTOGRAM W/LOWER EXTREMITY;  Surgeon: Eliza Lonni RAMAN, MD;  Location: [facility] INVASIVE CV LAB;  Service: Cardiovascular;  Laterality: Bilateral;    APPENDECTOMY  1950   age 43   BYPASS GRAFT FEMORAL-PERONEAL Left 02/21/2019   Procedure: BYPASS GRAFT FEMORAL-PERONEAL WITH VEIN GRAFT;  Surgeon: Eliza Lonni RAMAN, MD;  Location: [facility] OR;  Service: Vascular;  Laterality: Left;   COLONOSCOPY     TOTAL HIP ARTHROPLASTY Left 10/02/2019   Procedure: TOTAL HIP ARTHROPLASTY ANTERIOR APPROACH;  Surgeon: Ernie Cough, MD;  Location: [facility] ORS;  Service: Orthopedics;  Laterality: Left;  70 mins   TOTAL HIP ARTHROPLASTY Right 05/23/2022   Procedure: TOTAL HIP ARTHROPLASTY ANTERIOR APPROACH;  Surgeon: Ernie Cough, MD;  Location: [facility] ORS;  Service: Orthopedics;  Laterality: Right;   WRIST SURGERY Left 2015   Social History   Occupational History   Not on file  Tobacco Use   Smoking status: Never   Smokeless tobacco: Never  Vaping Use   Vaping status: Never Used  Substance and Sexual Activity   Alcohol  use: Never   Drug use: No   Sexual activity: Not on file

## 2024-07-07 ENCOUNTER — Encounter: Payer: Self-pay | Admitting: Family Medicine

## 2024-07-08 ENCOUNTER — Ambulatory Visit (HOSPITAL_BASED_OUTPATIENT_CLINIC_OR_DEPARTMENT_OTHER)
Admission: RE | Admit: 2024-07-08 | Discharge: 2024-07-08 | Disposition: A | Discharge status: Home / Self Care | Source: Ambulatory Visit | Attending: Family Medicine | Admitting: Family Medicine

## 2024-07-08 DIAGNOSIS — R059 Cough, unspecified: Secondary | ICD-10-CM | POA: Diagnosis present

## 2024-07-09 ENCOUNTER — Encounter: Payer: Self-pay | Admitting: Family Medicine

## 2024-07-11 ENCOUNTER — Other Ambulatory Visit: Payer: Self-pay | Admitting: Family Medicine

## 2024-07-11 ENCOUNTER — Ambulatory Visit: Payer: Self-pay | Admitting: Family Medicine

## 2024-07-11 MED ORDER — LEVOCETIRIZINE DIHYDROCHLORIDE 5 MG PO TABS: 5.0000 mg | ORAL_TABLET | Freq: Every evening | ORAL | 2 refills | Status: DC

## 2024-07-23 ENCOUNTER — Ambulatory Visit: Admitting: Family Medicine

## 2024-07-23 VITALS — BP 130/82 | Ht 71.0 in | Wt 220.0 lb

## 2024-07-23 DIAGNOSIS — G5601 Carpal tunnel syndrome, right upper limb: Secondary | ICD-10-CM | POA: Diagnosis present

## 2024-07-23 NOTE — Patient Instructions (Signed)
 You have carpal tunnel syndrome. Wear the wrist brace at nighttime and as often as possible during the day Voltaren  gel up to 4 times a day topically but if this doesn't help enough you can take aleve twice a day with food for pain and inflammation. A prednisone  dose pack is a consideration. Follow up with me in 6 weeks.

## 2024-07-23 NOTE — Progress Notes (Unsigned)
 PCP: Frann Mabel Mt, DO  Subjective:   HPI: Patient is a 83 y.o. male here for evaluation of right wrist pain and tingling.  Patient reports that he has been experiencing worsening pain and tingling in his wright wrist/hand for the past 3 weeks. He says he feels as though his wrist is swollen and that his pain extends all the way to the elbow. He has been experiencing numbness/tingling of his right thumb, 2nd, and 3rd finger that is worse at night when he wakes up. He has tried treating with a compressive bandage and Voltaren  but they have not been helpful. He plays golf 3 times a week and says that his symptoms are manageable while golfing. Patient is left-handed.   Past Medical History:  Diagnosis Date   Aneurysm of left popliteal artery (HCC) 01/31/2019   thrombosed left popliteal artery aneurysm   Arthritis    ED (erectile dysfunction)    Hyperlipemia    Hypertension    Morbid obesity (HCC) 02/11/2013   PAD (peripheral artery disease) (HCC)    Prostate cancer (HCC)     Current Outpatient Medications on File Prior to Visit  Medication Sig Dispense Refill   aspirin  EC 81 MG tablet Take by mouth daily.     levocetirizine (XYZAL ) 5 MG tablet Take 1 tablet (5 mg total) by mouth every evening. 30 tablet 2   olmesartan -hydrochlorothiazide  (BENICAR  HCT) 40-25 MG tablet Take 1 tablet by mouth every evening. 90 tablet 2   rosuvastatin  (CRESTOR ) 40 MG tablet Take 1 tablet (40 mg total) by mouth daily. 90 tablet 2   No current facility-administered medications on file prior to visit.    Past Surgical History:  Procedure Laterality Date   ABDOMINAL AORTOGRAM W/LOWER EXTREMITY Bilateral 01/31/2019   Procedure: ABDOMINAL AORTOGRAM W/LOWER EXTREMITY;  Surgeon: Eliza Lonni RAMAN, MD;  Location: [facility] INVASIVE CV LAB;  Service: Cardiovascular;  Laterality: Bilateral;   APPENDECTOMY  1950   age 35   BYPASS GRAFT FEMORAL-PERONEAL Left 02/21/2019   Procedure: BYPASS GRAFT  FEMORAL-PERONEAL WITH VEIN GRAFT;  Surgeon: Eliza Lonni RAMAN, MD;  Location: [facility] OR;  Service: Vascular;  Laterality: Left;   COLONOSCOPY     TOTAL HIP ARTHROPLASTY Left 10/02/2019   Procedure: TOTAL HIP ARTHROPLASTY ANTERIOR APPROACH;  Surgeon: Ernie Cough, MD;  Location: [facility] ORS;  Service: Orthopedics;  Laterality: Left;  70 mins   TOTAL HIP ARTHROPLASTY Right 05/23/2022   Procedure: TOTAL HIP ARTHROPLASTY ANTERIOR APPROACH;  Surgeon: Ernie Cough, MD;  Location: [facility] ORS;  Service: Orthopedics;  Laterality: Right;   WRIST SURGERY Left 2015    No Known Allergies  BP 130/82   Ht 5' 11 (1.803 m)   Wt 220 lb (99.8 kg)   BMI 30.68 kg/m       No data to display              No data to display              Objective:  Physical Exam:  Gen: NAD, comfortable in exam room  MSK: Right wrist Inspection: No bony or soft tissue abnormalities appreciated. Symmetric in comparison to left wrist.  Palpation: There is tenderness to palpation at the base of the 1st metacarpal bone.  ROM: FROM with wrist flexion, radial deviation, and ulnar deviation. Limited range of motion with wrist extension.  Strength: 5/5 with wrist flexion/extension and radial/ulnar deviation. Patient endorses discomfort with resisted ulnar deviation.  Neuro/Vasc: Neurovascularly intact distally.  Special Tests: Negative Tinel, Phalen, and  Finkelstein    Assessment & Plan:  1. Right wrist pain/numbness - Patient's right wrist pain/numbness is most consistent with mild Carpal Tunnel Syndrome. Discussed with patient that given the mild nature of his symptoms, recommend treating initially with a nighttime wrist brace and encouraged him to wear the brace as often as possible during the day. He can also use Voltaren  gel up to 4 times daily but can also consider Aleve twice daily with food for pain and inflammation. Recommended patient follow-up in 6 weeks.  - Wrist brace at night and as often as possible during the  day for 6 weeks  - Voltaren  gel up to 4 times daily and Aleve twice daily with food as needed for pain/inflammation  - Consider steroid dose pack in the future  - Follow-up in 6 weeks   Signe Ravel, MS4 [facility]

## 2024-07-24 ENCOUNTER — Encounter: Payer: Self-pay | Admitting: Family Medicine

## 2024-07-29 ENCOUNTER — Encounter: Payer: Self-pay | Admitting: Family Medicine

## 2024-08-06 ENCOUNTER — Ambulatory Visit: Admitting: Family Medicine

## 2024-08-18 ENCOUNTER — Ambulatory Visit (INDEPENDENT_AMBULATORY_CARE_PROVIDER_SITE_OTHER): Admitting: Family Medicine

## 2024-08-18 ENCOUNTER — Other Ambulatory Visit: Payer: Self-pay

## 2024-08-18 VITALS — BP 134/80 | Ht 71.0 in | Wt 220.0 lb

## 2024-08-18 DIAGNOSIS — G5601 Carpal tunnel syndrome, right upper limb: Secondary | ICD-10-CM

## 2024-08-18 DIAGNOSIS — M5412 Radiculopathy, cervical region: Secondary | ICD-10-CM

## 2024-08-18 MED ORDER — NAPROXEN 500 MG PO TABS: 500.0000 mg | ORAL_TABLET | Freq: Two times a day (BID) | ORAL | 2 refills | Status: AC | PRN

## 2024-08-18 NOTE — Progress Notes (Signed)
 PCP: Frann Mabel Mt, DO  Subjective:   HPI: Patient is a 83 y.o. male here for follow-up of right carpal tunnel syndrome.  Right carpal tunnel Seen by Dr. Cleatrice on 08/27, started on Voltaren  gel, Aleve , wrist bracing at night. Symptoms present for several months Was unable to start leaf blower due to strength in the right hand Has been wearing brace often Does not have same grip strength Feels good when brace is on, but has not seen strength improvement Sometimes numbness and tingling goes all the way up arm Plays golf Was able to make through golf round with aspirin  for pain Pain at distal radial styloid Thinks there is some swelling Denies neck pain  Past Medical History:  Diagnosis Date   Aneurysm of left popliteal artery (HCC) 01/31/2019   thrombosed left popliteal artery aneurysm   Arthritis    ED (erectile dysfunction)    Hyperlipemia    Hypertension    Morbid obesity (HCC) 02/11/2013   PAD (peripheral artery disease) (HCC)    Prostate cancer (HCC)     Current Outpatient Medications on File Prior to Visit  Medication Sig Dispense Refill   aspirin  EC 81 MG tablet Take by mouth daily.     levocetirizine (XYZAL ) 5 MG tablet Take 1 tablet (5 mg total) by mouth every evening. 30 tablet 2   olmesartan -hydrochlorothiazide  (BENICAR  HCT) 40-25 MG tablet Take 1 tablet by mouth every evening. 90 tablet 2   rosuvastatin  (CRESTOR ) 40 MG tablet Take 1 tablet (40 mg total) by mouth daily. 90 tablet 2   No current facility-administered medications on file prior to visit.    Past Surgical History:  Procedure Laterality Date   ABDOMINAL AORTOGRAM W/LOWER EXTREMITY Bilateral 01/31/2019   Procedure: ABDOMINAL AORTOGRAM W/LOWER EXTREMITY;  Surgeon: Eliza Lonni RAMAN, MD;  Location: [facility] INVASIVE CV LAB;  Service: Cardiovascular;  Laterality: Bilateral;   APPENDECTOMY  1950   age 46   BYPASS GRAFT FEMORAL-PERONEAL Left 02/21/2019   Procedure: BYPASS GRAFT FEMORAL-PERONEAL  WITH VEIN GRAFT;  Surgeon: Eliza Lonni RAMAN, MD;  Location: [facility] OR;  Service: Vascular;  Laterality: Left;   COLONOSCOPY     TOTAL HIP ARTHROPLASTY Left 10/02/2019   Procedure: TOTAL HIP ARTHROPLASTY ANTERIOR APPROACH;  Surgeon: Ernie Cough, MD;  Location: [facility] ORS;  Service: Orthopedics;  Laterality: Left;  70 mins   TOTAL HIP ARTHROPLASTY Right 05/23/2022   Procedure: TOTAL HIP ARTHROPLASTY ANTERIOR APPROACH;  Surgeon: Ernie Cough, MD;  Location: [facility] ORS;  Service: Orthopedics;  Laterality: Right;   WRIST SURGERY Left 2015    No Known Allergies  There were no vitals taken for this visit.      No data to display              No data to display              Objective:  Physical Exam:  Gen: NAD, comfortable in exam room  Right wrist Inspection: No obvious deformity, erythema, swelling, ecchymoses. Palpation: Mildly tender to palpation at the carpal radial joint line at the first extensor compartment, no CMC tenderness, no tenderness along the first extensor compartment roughly 4 cm from the distal joint line ROM: Full range of motion flexion, extension, ulnar and radial lateral motion of the right wrist Special Tests: Positive Finkelstein, negative Tinel's, positive Phalen Strength: Strength 4+ /5 wrist flexion extension in comparison to the left wrist Neurovascular: Radial pulse 2+, sensation intact Additional: No obvious neck deformity or swelling, no tenderness to palpation  over the cervical spinous processes, full ROM of all neck, negative Spurling, strength 4+/5 biceps and triceps muscle groups in comparison to the left side  Limited Wrist US : Right mild hypoechoic changes surrounding the first extensor compartment at the level of Lister's tubercle, 1st wrist extensor compartment visualized in SAX and LAX, left first extensor compartment visualized as well with similar findings, right median nerve visualized at the level of the pisiform, measured area 0.07 cm.  No  notch sign of the median nerve in LAX. Impression: Normal 1st extensor compartment, median nerve. No ultrasound findings of de Quervain's tenosynovitis, or evidence of carpal tunnel syndrome.   Assessment & Plan:   Assessment & Plan Right carpal tunnel syndrome Cervical radiculopathy Lack of improvement with regular wrist bracing is concerning for alternate etiology for underlying median nerve distribution of numbness and tingling, and generalized weakness of the right upper extremity in comparison to the left.  While patient may have carpal tunnel syndrome (however this was not consistent with ultrasound findings) contributing somewhat, it is more likely that there is also component of hand and wrist osteoarthritis contributing to his pain and additionally cervical spinal OA causing radiculopathic symptoms and mild myelopathy.   Discussed with patient, given travel plans this week will treat conservatively with scheduled anti-inflammatory. Naprosyn  500 mg twice a day for 1 week, afterwards as needed Consider steroid burst if no further improvement after travel Would also consider EMG right upper extremity and cervical x-rays if again no further improvement at follow-up  Ozell Provencal, MD, PGY-3 [facility] 3:20 PM 08/18/2024

## 2024-08-18 NOTE — Patient Instructions (Addendum)
 Take naproxen  500mg  twice a day with food for 7 days then as needed. Ok to take tylenol  with this. But don't take aleve , advil /ibuprofen . Your ultrasound looks good - this suggests the issue is coming from an irritated nerve in your neck (C6 and/or C7 nerve root). Continue wearing the wrist brace at nighttime - if you have carpal tunnel this is mild though with the normal ultrasound. Follow up with me in 1 month.   Will consider cervical spine imaging, prednisone  dose pack in future.

## 2024-09-10 ENCOUNTER — Ambulatory Visit: Admitting: Family Medicine

## 2024-09-29 ENCOUNTER — Encounter: Payer: Self-pay | Admitting: Radiology

## 2024-10-02 ENCOUNTER — Encounter: Payer: Self-pay | Admitting: Family Medicine

## 2024-10-19 ENCOUNTER — Other Ambulatory Visit: Payer: Self-pay | Admitting: Family Medicine

## 2024-10-19 DIAGNOSIS — I10 Essential (primary) hypertension: Secondary | ICD-10-CM
